# Patient Record
Sex: Male | Born: 1955
Health system: Southern US, Community
[De-identification: ages and names within clinical notes are randomized; demographics above are authoritative.]

## PROBLEM LIST (undated history)

## (undated) ENCOUNTER — Emergency Department (HOSPITAL_COMMUNITY): Discharge status: Against Medical Advice | Payer: Managed Care, Other (non HMO)

## (undated) DIAGNOSIS — F32A Depression, unspecified: Secondary | ICD-10-CM

## (undated) DIAGNOSIS — E785 Hyperlipidemia, unspecified: Secondary | ICD-10-CM

## (undated) DIAGNOSIS — F319 Bipolar disorder, unspecified: Secondary | ICD-10-CM

## (undated) DIAGNOSIS — I519 Heart disease, unspecified: Secondary | ICD-10-CM

## (undated) DIAGNOSIS — I2510 Atherosclerotic heart disease of native coronary artery without angina pectoris: Secondary | ICD-10-CM

## (undated) DIAGNOSIS — I480 Paroxysmal atrial fibrillation: Secondary | ICD-10-CM

## (undated) DIAGNOSIS — F329 Major depressive disorder, single episode, unspecified: Secondary | ICD-10-CM

## (undated) DIAGNOSIS — Z720 Tobacco use: Secondary | ICD-10-CM

## (undated) HISTORY — PX: APPENDECTOMY: SHX54

## (undated) HISTORY — PX: TONSILLECTOMY: SUR1361

---

## 2005-06-13 ENCOUNTER — Ambulatory Visit: Payer: Self-pay | Admitting: Family Medicine

## 2005-07-20 ENCOUNTER — Ambulatory Visit: Payer: Self-pay | Admitting: Family Medicine

## 2006-01-12 ENCOUNTER — Ambulatory Visit: Payer: Self-pay | Admitting: Family Medicine

## 2006-04-19 ENCOUNTER — Inpatient Hospital Stay (HOSPITAL_COMMUNITY): Admission: RE | Admit: 2006-04-19 | Discharge: 2006-04-20 | Payer: Self-pay | Admitting: Psychiatry

## 2006-04-19 ENCOUNTER — Ambulatory Visit: Payer: Self-pay | Admitting: Psychiatry

## 2010-04-13 ENCOUNTER — Encounter (INDEPENDENT_AMBULATORY_CARE_PROVIDER_SITE_OTHER): Payer: Self-pay | Admitting: *Deleted

## 2010-09-13 NOTE — Letter (Signed)
Summary: Colonoscopy Date Change Letter  [facility]  [address] [place], [place] [address]   Phone: [phone]  Fax: [phone]      April 13, 2010 MRN: [id number]   [facility] [place] [address] [place], [place]  [address]   Dear Mr. Wenrick,   Previously you were recommended to have a repeat colonoscopy around this time. Your chart was recently reviewed by Dr. Claudette Head of [facility]. Follow up colonoscopy is now recommended in October 2014. This revised recommendation is based on current, nationally recognized guidelines for colorectal cancer screening and polyp surveillance. These guidelines are endorsed by the American Cancer Society, [employer] on Colorectal Cancer as well as numerous other major medical organizations.  Please understand that our recommendation assumes that you do not have any new symptoms such as bleeding, a change in bowel habits, anemia, or significant abdominal discomfort. If you do have any concerning GI symptoms or want to discuss the guideline recommendations, please call to arrange an office visit at your earliest convenience. Otherwise we will keep you in our reminder system and contact you 1-2 months prior to the date listed above to schedule your next colonoscopy.  Thank you,  Judie Petit T. Russella Dar, M.D.  [facility] [phone]

## 2011-09-28 ENCOUNTER — Emergency Department (HOSPITAL_COMMUNITY)
Admission: EM | Admit: 2011-09-28 | Discharge: 2011-09-28 | Disposition: A | Discharge status: Home / Self Care | Payer: Managed Care, Other (non HMO) | Attending: Emergency Medicine | Admitting: Emergency Medicine

## 2011-09-28 ENCOUNTER — Telehealth: Payer: Self-pay

## 2011-09-28 ENCOUNTER — Encounter (HOSPITAL_COMMUNITY): Payer: Self-pay | Admitting: Emergency Medicine

## 2011-09-28 DIAGNOSIS — F3130 Bipolar disorder, current episode depressed, mild or moderate severity, unspecified: Secondary | ICD-10-CM | POA: Insufficient documentation

## 2011-09-28 DIAGNOSIS — R5383 Other fatigue: Secondary | ICD-10-CM | POA: Insufficient documentation

## 2011-09-28 DIAGNOSIS — F32A Depression, unspecified: Secondary | ICD-10-CM

## 2011-09-28 DIAGNOSIS — F319 Bipolar disorder, unspecified: Secondary | ICD-10-CM | POA: Insufficient documentation

## 2011-09-28 DIAGNOSIS — F329 Major depressive disorder, single episode, unspecified: Secondary | ICD-10-CM

## 2011-09-28 DIAGNOSIS — R5381 Other malaise: Secondary | ICD-10-CM | POA: Insufficient documentation

## 2011-09-28 DIAGNOSIS — F17200 Nicotine dependence, unspecified, uncomplicated: Secondary | ICD-10-CM | POA: Insufficient documentation

## 2011-09-28 HISTORY — DX: Bipolar disorder, unspecified: F31.9

## 2011-09-28 LAB — BASIC METABOLIC PANEL
BUN: 13 mg/dL (ref 6–23)
CO2: 25 mEq/L (ref 19–32)
Chloride: 102 mEq/L (ref 96–112)
GFR calc non Af Amer: 89 mL/min — ABNORMAL LOW (ref >90)
Glucose, Bld: 82 mg/dL (ref 70–99)
Potassium: 3.7 mEq/L (ref 3.5–5.1)
Sodium: 137 mEq/L (ref 135–145)

## 2011-09-28 LAB — CBC
HCT: 42.1 % (ref 39.0–52.0)
Hemoglobin: 14.1 g/dL (ref 13.0–17.0)
MCHC: 33.5 g/dL (ref 30.0–36.0)
RBC: 5.21 MIL/uL (ref 4.22–5.81)
WBC: 8.1 10*3/uL (ref 4.0–10.5)

## 2011-09-28 LAB — URINE MICROSCOPIC-ADD ON

## 2011-09-28 LAB — URINALYSIS, ROUTINE W REFLEX MICROSCOPIC
Specific Gravity, Urine: 1.01 (ref 1.005–1.030)
Urobilinogen, UA: 0.2 mg/dL (ref 0.0–1.0)
pH: 6 (ref 5.0–8.0)

## 2011-09-28 LAB — RAPID URINE DRUG SCREEN, HOSP PERFORMED
Benzodiazepines: NOT DETECTED
Cocaine: NOT DETECTED
Opiates: NOT DETECTED
Tetrahydrocannabinol: NOT DETECTED

## 2011-09-28 NOTE — Telephone Encounter (Signed)
SPOKE WITH PTS WIFE AND SHE WAS VERY UPSET STATING PT FLUSHED HIS PILLS DOWN THE TOILET. HE WAS

## 2011-09-28 NOTE — ED Notes (Signed)
Pt here to see someone for help with depression; pt sts has been seen in past for depression and was on cymbalta but could not afford it and then saw a psychiatrist and was placed on lamictal and respirdal; pt sts did not work for him and with guidance of psychiatrist titrated himself off those meds; pt sts still having depression sx with fatigue and no ambition; pt denies SI/HI; pt cooperative at present

## 2011-09-28 NOTE — BH Assessment (Signed)
Assessment Note   Jason Maldonado is an 56 y.o.Caucasian male. Pt presented to [facility] ED for reported depression and desires referrals for other doctors who treat depression. Pt reported he is depressed due to stress, marital conflict at home and came specifically today because he was sad about Valentine's Day. Pt reports isolating, loss of interest in once pleasurable activities, fatigue, lack of motivation, irritable, and hopelessness. Pt denies SI current and past, denies self harm, denies HI and denies any alcohol or drug use of hx of SA. Pt reports he has a hx of depression and was first treated by his PCP, Dr. Andee Poles, for 1.5 years with Cymbalta, which worked well for him. Pt stopped taking Cymbalta due to expense and was referred to psychiatrist, Dr. Tish Men, in December of 2012 and started taking Lamictal and Risperdol. In December before taking the new meds, pt reported he told the doctor pt was anxious about taking these medications due to potential side-effects but doctor told pt to try them. Pt did not like the side effects and was titrated off Lamictal and Risperdol two weeks ago and does not want to return to Dr. Tish Men. Pt reports his anxiety about the potential side-effects, decreased sex drive and heart palpitations, was too much for him to deal with and no longer trusts Dr. Tish Men. Pt reports an IVC 2010 to [facility], initiated by his wife and son, to which, he was released that day. Pt reports a voluntary inpatient stay in 2007 at [facility] due to depression. He took Prozac during that hospitalization and continued for two years but felt it eventually stopped working. Pt reports he came for help because he does not want to become worse, where he looses his concentration, becomes panicky and second guesses himself.   Per Felicie Morn, NP, pt was referred to seek outpatient services and was given a referral sheet for potential psychiatrists and counselors.   Axis I: 311 Depressive Dx  NOS Axis II: 799.9 Deferred Axis III:  Past Medical History  Diagnosis Date  . Bipolar 1 disorder    Axis IV: problems with access to health care services and problems with primary support group Axis V: 50  Past Medical History:  Past Medical History  Diagnosis Date  . Bipolar 1 disorder     History reviewed. No pertinent past surgical history.  Family History: History reviewed. No pertinent family history.  Social History:  reports that he has been smoking.  He does not have any smokeless tobacco history on file. He reports that he does not drink alcohol or use illicit drugs.  Additional Social History:  Alcohol / Drug Use History of alcohol / drug use?: No history of alcohol / drug abuse Allergies: No Known Allergies  Home Medications:  No current outpatient prescriptions on file as of 09/28/2011.   No current facility-administered medications on file as of 09/28/2011.    OB/GYN Status:  No LMP for male patient.  General Assessment Data Location of Assessment: [facility] ED Living Arrangements: Spouse/significant other;Children (Pt reports living with wife and 34yo son.) Can pt return to current living arrangement?: Yes Admission Status: Voluntary Is patient capable of signing voluntary admission?: Yes Transfer from: Home Referral Source: Self/Family/Friend  Education Status Is patient currently in school?: No  Risk to self Suicidal Ideation: No (Pt denies current and past SI) Suicidal Intent: No Is patient at risk for suicide?: No Suicidal Plan?: No Access to Means: No What has been your use of drugs/alcohol within the last  12 months?: Pt denies use of alcohol and drugs Previous Attempts/Gestures: No How many times?: 0  Other Self Harm Risks: 0 Triggers for Past Attempts: None known Intentional Self Injurious Behavior: None Family Suicide History: No Recent stressful life event(s): Conflict (Comment) (Pt reports conflict with his wife due to his  depression.) Persecutory voices/beliefs?: No Depression: Yes Depression Symptoms: Fatigue;Isolating;Loss of interest in usual pleasures;Feeling angry/irritable Substance abuse history and/or treatment for substance abuse?: No Suicide prevention information given to non-admitted patients: Not applicable  Risk to Others Homicidal Ideation: No Thoughts of Harm to Others: No Current Homicidal Intent: No Current Homicidal Plan: No Access to Homicidal Means: No Identified Victim: NA History of harm to others?: No Assessment of Violence: On admission Violent Behavior Description: NA Does patient have access to weapons?: No Criminal Charges Pending?: No Does patient have a court date: No  Psychosis Hallucinations: None noted Delusions: None noted  Mental Status Report Appear/Hygiene:  (Pt appeared well dressed and clean) Eye Contact: Good Motor Activity: Unremarkable Speech: Logical/coherent Level of Consciousness: Alert Mood: Depressed;Anxious Affect: Anxious;Depressed Anxiety Level: Moderate Thought Processes: Relevant;Coherent Judgement: Unimpaired Orientation: Person;Place;Time;Situation Obsessive Compulsive Thoughts/Behaviors: None  Cognitive Functioning Concentration: Normal Memory: Recent Intact;Remote Intact IQ: Average Insight: Good Impulse Control: Good Appetite: Good Weight Loss: 0  Weight Gain: 0  Sleep: No Change Total Hours of Sleep: 7  Vegetative Symptoms: None  Prior Inpatient Therapy Prior Inpatient Therapy: Yes Prior Therapy Dates: 2010, 2007 Prior Therapy Facilty/Provider(s): 2010- [facility], 2007- [facility] Reason for Treatment: Depression w/o SI  Prior Outpatient Therapy Prior Outpatient Therapy: Yes Prior Therapy Dates: Most recent visit was 2 weeks ago Prior Therapy Facilty/Provider(s): [facility], Dr. Tish Men Reason for Treatment: depression, possible bipolar I  ADL Screening (condition at time of admission) Patient's  cognitive ability adequate to safely complete daily activities?: Yes Patient able to express need for assistance with ADLs?: Yes Independently performs ADLs?: Yes Weakness of Legs: None Weakness of Arms/Hands: None  Home Assistive Devices/Equipment Home Assistive Devices/Equipment: None    Abuse/Neglect Assessment (Assessment to be complete while patient is alone) Physical Abuse: Denies Verbal Abuse: Denies Sexual Abuse: Denies Exploitation of patient/patient's resources: Denies Self-Neglect: Denies Values / Beliefs Cultural Requests During Hospitalization: None Spiritual Requests During Hospitalization: None     Nutrition Screen Unintentional weight loss greater than 10lbs within the last month: No Dysphagia: No Home Tube Feeding or Total Parenteral Nutrition (TPN): No Pregnant or Lactating: No  Additional Information 1:1 In Past 12 Months?: No CIRT Risk: No Elopement Risk: No Does patient have medical clearance?: Yes     Disposition: Felicie Morn, PA, referred pt to follow up with outpatient tx. Pt was given a list of [facility] referrals for outpatient psychiatrists and counselors. Disposition Disposition of Patient: Referred to (Pt was given a list of outpatient psychiatrists and counselo) Patient referred to: Outpatient clinic referral  On Site Evaluation by:   Reviewed with Physician:     Constance Haw, Irwin Brakeman 09/28/2011 9:03 PM

## 2011-09-28 NOTE — Discharge Instructions (Signed)
Manic Depressive Illness Manic depressive illness (manic depression) is called a bipolar disorder because patients with this illness have both ends of the range of feelings. They may feel as though they are in a deep hole during the depression phase and feel unable to get out of what they believe is a hopeless situation. During the manic phase they feel as though they are full of energy and can accomplish anything with their boundless energy. Many lives are ruined by this disease; and without effective treatment, the illness is connected with an increased risk of suicide. Bipolar disorder is a serious brain disease that causes extreme shifts in mood, energy, and functioning. It affects about 2.3 million adult Americans. This is about 1.2 percent of the population. Men and women are equally likely to develop this illness. The disorder usually starts in adolescence or early adulthood, but can start in childhood. This illness may be passed from your parents but the gene causing this illness has not been found. Cycles, or episodes, of depression, mania, or "mixed" manic and depressive symptoms often recur (come back) and may become more frequent. This illness can disrupt work, school, family, and social life. It is important to give your caregiver a complete picture of what has been happening. Help is often looked for during the depression phase. If treatment for depression is started, some of the antidepressant medications can actually make things worse. Antidepressants can trigger mania with a worsening of the illness. There are a number of medications which work well with this disease and your caregiver can help you find the medication or combination of medications which will work best for you.  SYMPTOMS  MANIA Abnormally and persistently elevated (high) mood or irritability and aggressiveness, accompanied by at least three of the following symptoms:  Overly-inflated self-esteem (You think a lot of yourself  like a show-off)   Decreased need for sleep (You feel so full of energy that it seems as if you do not need sleep)   Increased talkativeness   Racing thoughts (Your ideas and thoughts may jump from one to the other in an endless stream)   Distractibility (It is difficult to keep your mind on one subject.)   Increased goal-directed activity such as shopping   Physical agitation (You may find it difficult to sit still)   Excessive involvement in risky or reckless behaviors or activities, such as spending sprees, poor business decisions, and sexual indiscretions   Poor judgment and decision making. (Your decisions are not normal or sensible)   Impulsiveness (You react quickly in an instant without thinking things through)  DEPRESSION Symptoms include:  Loss of interest or pleasure in activities that were once enjoyed   Significant change in appetite or body weight   Difficulty sleeping, or oversleeping   Physical slowing or agitation   Loss of energy   Feelings of worthlessness or inappropriate guilt   Difficulty thinking or concentrating; poor decision making abilities   Feelings of inadequacy and low self esteem (You may feel as though you are worth nothing)   Prolonged periods of sadness without apparent cause   Unexplained crying spells   Withdrawal from usual friends and family (You may spend more time alone)   Recurrent thoughts of death and suicide  MIXED STATE Symptoms of mania and depression are present at the same time. The symptom picture frequently includes:  Agitation   Trouble sleeping   Significant change in appetite   Psychosis   Suicidal thinking  BIPOLAR DISORDER WITH   RAPID RECYCLING Especially early in the course of illness, the episodes may be separated by periods of wellness during which a person suffers few to no symptoms. When four or more episodes of illness occur within a 14-month period, the person is said to have bipolar disorder with  rapid cycling. Bipolar disorder is often complicated by co-occurring alcohol or substance abuse. PSYCHOSIS Severe depression or mania may be accompanied by symptoms of psychosis. These symptoms include: hallucinations (hearing, seeing, or otherwise sensing the presence of stimuli that are not there) and delusions (false personal beliefs that are not subject to reason or contradictory evidence, and are not explained by a person's cultural concepts). Psychotic symptoms associated with bipolar typically reflect the extreme mood state at the time. TREATMENT  Many of the above problems sound awful. The good news is that if you work with your caregivers and let them know what is wrong, they can usually help you.  A variety of medications are used to treat bipolar disorder, but even with the best medication treatment, many people with the illness have some residual (left over) symptoms. Certain types of psychotherapy or psychosocial interventions, in combination with medication, often can provide additional benefits. These include cognitive-behavioral therapy, interpersonal and social rhythm therapy, family therapy, and psychoeducation. Your caregiver can explain these therapies to you.   Lithium has long been used as a first-line treatment for bipolar disorder. It has been an effective mood-stabilizing medication for many people with bipolar disorder.   Some anticonvulsant medications have been used as alternatives to lithium in many cases. Some research suggests that different combinations of lithium and anticonvulsants may be helpful.   According to studies conducted in [place] in patients with epilepsy, valproate may increase testosterone levels in teenage girls and produce polycystic ovary syndrome in women who began taking the medication before age 26. Increased testosterone can lead to polycystic ovary syndrome with irregular or absent menses (menstrual periods), obesity (being very overweight), and  abnormal growth of hair. Therefore, young male patients taking valproate should be watched carefully by a physician.   During a depressive episode, people with bipolar disorder commonly require additional treatment with antidepressant medication. Typically, lithium or anticonvulsant mood stabilizers are prescribed along with an antidepressant to protect against a switch into mania or rapid cycling. In some cases, the newer, atypical antipsychotic drugs may help relieve severe symptoms of bipolar disorder and prevent the return of mania. More research is needed to establish the safety and efficacy of atypical antipsychotics as long-term treatments for this disorder.  Document Released: 10/27/2003 Document Revised: 04/12/2011 Document Reviewed: 07/31/2005 [facility] Patient Information 2012 [place], [place].  RESOURCE GUIDE   Please use the resource information provided by the ACT counselor to explore options for outpatient care.

## 2011-09-28 NOTE — ED Notes (Signed)
Behavioral Health here assessing patient.

## 2011-09-28 NOTE — ED Provider Notes (Signed)
History     CSN: [id number]  Arrival date & time 09/28/11  1509   First MD Initiated Contact with Patient 09/28/11 1815      Chief Complaint  Patient presents with  . Depression    (Consider location/radiation/quality/duration/timing/severity/associated sxs/prior treatment) Patient is a 56 y.o. male presenting with mental health disorder.  Mental Health Problem The primary symptoms include dysphoric mood. The current episode started more than 1 month ago. This is a chronic problem.  The onset of the illness is precipitated by emotional stress. The degree of incapacity that he is experiencing as a consequence of his illness is mild. Additional symptoms of the illness include fatigue and agitation. He does not admit to suicidal ideas. He does not have a plan to commit suicide. He does not contemplate harming himself. He has not already injured self. He does not contemplate injuring another person. He has not already  injured another person. Risk factors that are present for mental illness include a history of mental illness and a family history of mental illness.  Patient has been seen by a psychiatrist, but patient is not happy with the treatment that has been offered.  Has titrated off medications.  Recent increase in feelings of depression, states he feels disconnected and empty.  Past Medical History  Diagnosis Date  . Bipolar 1 disorder     History reviewed. No pertinent past surgical history.  History reviewed. No pertinent family history.  History  Substance Use Topics  . Smoking status: Current Everyday Smoker  . Smokeless tobacco: Not on file  . Alcohol Use: No      Review of Systems  Constitutional: Positive for fatigue.  Psychiatric/Behavioral: Positive for dysphoric mood and agitation.  All other systems reviewed and are negative.    Allergies  Review of patient's allergies indicates no known allergies.  Home Medications   Current Outpatient Rx  Name  Route Sig Dispense Refill  . IBUPROFEN 200 MG PO TABS Oral Take 600 mg by mouth every 6 (six) hours as needed. For pain    . LAMOTRIGINE 100 MG PO TABS Oral Take 100 mg by mouth daily.    Marland Kitchen LORATADINE 10 MG PO TABS Oral Take 10 mg by mouth daily as needed. For seasonal allergies    . RISPERIDONE 0.5 MG PO TABS Oral Take 0.5 mg by mouth daily.    Marland Kitchen SALINE NASAL SPRAY 0.65 % NA SOLN Nasal Place 1 spray into the nose as needed.      BP 155/115  Pulse 79  Temp(Src) 98 F (36.7 C) (Oral)  Resp 18  SpO2 99%  Physical Exam  Constitutional: He is oriented to person, place, and time. He appears well-developed and well-nourished.  HENT:  Head: Normocephalic and atraumatic.  Eyes: Conjunctivae are normal. Pupils are equal, round, and reactive to light.  Neck: Normal range of motion. Neck supple.  Cardiovascular: Normal rate, regular rhythm, normal heart sounds and intact distal pulses.   Pulmonary/Chest: Effort normal and breath sounds normal.  Abdominal: Soft. Bowel sounds are normal.  Musculoskeletal: Normal range of motion.  Neurological: He is alert and oriented to person, place, and time.  Skin: Skin is warm and dry.  Psychiatric: His speech is normal. Judgment and thought content normal. He is agitated. Cognition and memory are normal. He exhibits a depressed mood.    ED Course  Procedures (including critical care time)  Labs Reviewed - No data to display No results found.   No diagnosis found.  Marland Kitchen  1920:  Discussed with the ACT team--they will see patient after labs result. 8:45 PM  Patient has been seen and assessed by ACT.  Referral information provided to patient by counselor. MDM          Jimmye Norman, NP 09/28/11 2046

## 2011-09-28 NOTE — ED Notes (Signed)
Security called to wand pt  
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
## 2011-09-29 NOTE — ED Provider Notes (Signed)
Medical screening examination/treatment/procedure(s) were performed by non-physician practitioner and as supervising physician I was immediately available for consultation/collaboration.  Toy Baker, MD 09/29/11 [phone]

## 2012-09-27 ENCOUNTER — Encounter (HOSPITAL_COMMUNITY): Payer: Self-pay | Admitting: Nurse Practitioner

## 2012-09-27 ENCOUNTER — Encounter (HOSPITAL_COMMUNITY)
Admission: EM | Disposition: A | Discharge status: Home / Self Care | Payer: Self-pay | Source: Other Acute Inpatient Hospital | Attending: Cardiology

## 2012-09-27 ENCOUNTER — Inpatient Hospital Stay (HOSPITAL_COMMUNITY)
Admission: EM | Admit: 2012-09-27 | Discharge: 2012-10-02 | DRG: 246 | Disposition: A | Discharge status: Home / Self Care | Payer: Managed Care, Other (non HMO) | Source: Other Acute Inpatient Hospital | Attending: Cardiology | Admitting: Cardiology

## 2012-09-27 DIAGNOSIS — I2510 Atherosclerotic heart disease of native coronary artery without angina pectoris: Secondary | ICD-10-CM

## 2012-09-27 DIAGNOSIS — F17200 Nicotine dependence, unspecified, uncomplicated: Secondary | ICD-10-CM | POA: Diagnosis present

## 2012-09-27 DIAGNOSIS — I2119 ST elevation (STEMI) myocardial infarction involving other coronary artery of inferior wall: Principal | ICD-10-CM

## 2012-09-27 DIAGNOSIS — E785 Hyperlipidemia, unspecified: Secondary | ICD-10-CM

## 2012-09-27 DIAGNOSIS — Z720 Tobacco use: Secondary | ICD-10-CM | POA: Diagnosis present

## 2012-09-27 DIAGNOSIS — Z8249 Family history of ischemic heart disease and other diseases of the circulatory system: Secondary | ICD-10-CM

## 2012-09-27 DIAGNOSIS — I2109 ST elevation (STEMI) myocardial infarction involving other coronary artery of anterior wall: Secondary | ICD-10-CM

## 2012-09-27 DIAGNOSIS — I5021 Acute systolic (congestive) heart failure: Secondary | ICD-10-CM | POA: Diagnosis not present

## 2012-09-27 DIAGNOSIS — F319 Bipolar disorder, unspecified: Secondary | ICD-10-CM

## 2012-09-27 DIAGNOSIS — R570 Cardiogenic shock: Secondary | ICD-10-CM | POA: Diagnosis not present

## 2012-09-27 DIAGNOSIS — I219 Acute myocardial infarction, unspecified: Secondary | ICD-10-CM

## 2012-09-27 DIAGNOSIS — Z79899 Other long term (current) drug therapy: Secondary | ICD-10-CM

## 2012-09-27 HISTORY — DX: Tobacco use: Z72.0

## 2012-09-27 HISTORY — DX: Heart disease, unspecified: I51.9

## 2012-09-27 HISTORY — DX: Hyperlipidemia, unspecified: E78.5

## 2012-09-27 HISTORY — DX: Atherosclerotic heart disease of native coronary artery without angina pectoris: I25.10

## 2012-09-27 HISTORY — PX: LEFT HEART CATHETERIZATION WITH CORONARY ANGIOGRAM: SHX5451

## 2012-09-27 LAB — TROPONIN I: Troponin I: 2.87 ng/mL (ref <0.30)

## 2012-09-27 LAB — COMPREHENSIVE METABOLIC PANEL
ALT: 10 U/L (ref 0–53)
ALT: 30 U/L (ref 0–53)
AST: 227 U/L — ABNORMAL HIGH (ref 0–37)
Alkaline Phosphatase: 56 U/L (ref 39–117)
Alkaline Phosphatase: 60 U/L (ref 39–117)
CO2: 20 mEq/L (ref 19–32)
CO2: 22 mEq/L (ref 19–32)
Calcium: 8.6 mg/dL (ref 8.4–10.5)
Chloride: 103 mEq/L (ref 96–112)
GFR calc Af Amer: >90 mL/min (ref >90)
GFR calc Af Amer: >90 mL/min (ref >90)
GFR calc non Af Amer: >90 mL/min (ref >90)
GFR calc non Af Amer: >90 mL/min (ref >90)
Glucose, Bld: 122 mg/dL — ABNORMAL HIGH (ref 70–99)
Glucose, Bld: 89 mg/dL (ref 70–99)
Potassium: 3.5 mEq/L (ref 3.5–5.1)
Potassium: 4 mEq/L (ref 3.5–5.1)
Sodium: 133 mEq/L — ABNORMAL LOW (ref 135–145)
Sodium: 135 mEq/L (ref 135–145)

## 2012-09-27 LAB — CBC WITH DIFFERENTIAL/PLATELET
Hemoglobin: 12.6 g/dL — ABNORMAL LOW (ref 13.0–17.0)
Lymphocytes Relative: 14 % (ref 12–46)
Lymphs Abs: 1.3 10*3/uL (ref 0.7–4.0)
MCH: 27 pg (ref 26.0–34.0)
Monocytes Relative: 6 % (ref 3–12)
Neutro Abs: 7.6 10*3/uL (ref 1.7–7.7)
Neutrophils Relative %: 79 % — ABNORMAL HIGH (ref 43–77)
Platelets: 141 10*3/uL — ABNORMAL LOW (ref 150–400)
RBC: 4.67 MIL/uL (ref 4.22–5.81)
WBC: 9.6 10*3/uL (ref 4.0–10.5)

## 2012-09-27 LAB — POCT I-STAT, CHEM 8
BUN: 13 mg/dL (ref 6–23)
Calcium, Ion: 1.15 mmol/L (ref 1.12–1.23)
Chloride: 102 mEq/L (ref 96–112)
Creatinine, Ser: 1 mg/dL (ref 0.50–1.35)
Glucose, Bld: 120 mg/dL — ABNORMAL HIGH (ref 70–99)

## 2012-09-27 LAB — APTT
aPTT: 165 seconds — ABNORMAL HIGH (ref 24–37)
aPTT: 53 seconds — ABNORMAL HIGH (ref 24–37)

## 2012-09-27 LAB — TSH: TSH: 0.698 u[IU]/mL (ref 0.350–4.500)

## 2012-09-27 LAB — PROTIME-INR
INR: 9.81 (ref 0.00–1.49)
INR: 1.18 (ref 0.00–1.49)
Prothrombin Time: 14.8 seconds (ref 11.6–15.2)

## 2012-09-27 LAB — CBC
Hemoglobin: 11.9 g/dL — ABNORMAL LOW (ref 13.0–17.0)
RBC: 4.41 MIL/uL (ref 4.22–5.81)

## 2012-09-27 LAB — LIPID PANEL
LDL Cholesterol: 105 mg/dL — ABNORMAL HIGH (ref 0–99)
Triglycerides: 124 mg/dL (ref <150)
VLDL: 25 mg/dL (ref 0–40)

## 2012-09-27 SURGERY — LEFT HEART CATHETERIZATION WITH CORONARY ANGIOGRAM
Anesthesia: LOCAL

## 2012-09-27 MED ORDER — LIDOCAINE HCL (PF) 1 % IJ SOLN
INTRAMUSCULAR | Status: AC
  Filled 2012-09-27: qty 30

## 2012-09-27 MED ORDER — PRASUGREL HCL 10 MG PO TABS
ORAL_TABLET | ORAL | Status: AC
  Filled 2012-09-27: qty 6

## 2012-09-27 MED ORDER — BIVALIRUDIN 250 MG IV SOLR
INTRAVENOUS | Status: AC
  Filled 2012-09-27: qty 250

## 2012-09-27 MED ORDER — SODIUM CHLORIDE 0.9 % IJ SOLN
3.0000 mL | INTRAMUSCULAR | Status: DC | PRN
  Administered 2012-09-29: 3 mL via INTRAVENOUS

## 2012-09-27 MED ORDER — PRASUGREL HCL 10 MG PO TABS
10.0000 mg | ORAL_TABLET | Freq: Every day | ORAL | Status: DC
  Administered 2012-09-28 – 2012-10-02 (×5): 10 mg via ORAL
  Filled 2012-09-27 (×5): qty 1

## 2012-09-27 MED ORDER — ATORVASTATIN CALCIUM 80 MG PO TABS
80.0000 mg | ORAL_TABLET | Freq: Every day | ORAL | Status: DC
  Administered 2012-09-27 – 2012-10-01 (×5): 80 mg via ORAL
  Filled 2012-09-27 (×6): qty 1

## 2012-09-27 MED ORDER — ACETAMINOPHEN 325 MG PO TABS
650.0000 mg | ORAL_TABLET | ORAL | Status: DC | PRN
  Administered 2012-09-27 – 2012-09-28 (×2): 650 mg via ORAL
  Filled 2012-09-27 (×2): qty 2

## 2012-09-27 MED ORDER — HEPARIN SODIUM (PORCINE) 5000 UNIT/ML IJ SOLN
5000.0000 [IU] | Freq: Three times a day (TID) | INTRAMUSCULAR | Status: DC
  Filled 2012-09-27 (×17): qty 1

## 2012-09-27 MED ORDER — METOPROLOL TARTRATE 12.5 MG HALF TABLET
12.5000 mg | ORAL_TABLET | Freq: Two times a day (BID) | ORAL | Status: DC
  Administered 2012-09-27: 12.5 mg via ORAL
  Filled 2012-09-27 (×4): qty 1

## 2012-09-27 MED ORDER — ATROPINE SULFATE 1 MG/ML IJ SOLN
INTRAMUSCULAR | Status: AC
  Filled 2012-09-27: qty 1

## 2012-09-27 MED ORDER — RISPERIDONE 0.5 MG PO TABS
0.5000 mg | ORAL_TABLET | Freq: Every day | ORAL | Status: DC
  Administered 2012-09-27 – 2012-10-01 (×5): 0.5 mg via ORAL
  Filled 2012-09-27 (×6): qty 1

## 2012-09-27 MED ORDER — HEPARIN (PORCINE) IN NACL 2-0.9 UNIT/ML-% IJ SOLN
INTRAMUSCULAR | Status: AC
  Filled 2012-09-27: qty 1000

## 2012-09-27 MED ORDER — NITROGLYCERIN 0.4 MG SL SUBL: 0.4000 mg | SUBLINGUAL_TABLET | SUBLINGUAL | Status: DC | PRN

## 2012-09-27 MED ORDER — ONDANSETRON HCL 4 MG/2ML IJ SOLN: 4.0000 mg | Freq: Four times a day (QID) | INTRAMUSCULAR | Status: DC | PRN

## 2012-09-27 MED ORDER — LAMOTRIGINE 100 MG PO TABS
100.0000 mg | ORAL_TABLET | Freq: Every day | ORAL | Status: DC
  Administered 2012-09-27 – 2012-10-01 (×5): 100 mg via ORAL
  Filled 2012-09-27 (×6): qty 1

## 2012-09-27 MED ORDER — MIDAZOLAM HCL 2 MG/2ML IJ SOLN
INTRAMUSCULAR | Status: AC
  Filled 2012-09-27: qty 2

## 2012-09-27 MED ORDER — SODIUM CHLORIDE 0.9 % IJ SOLN
3.0000 mL | Freq: Two times a day (BID) | INTRAMUSCULAR | Status: DC
  Administered 2012-09-27 – 2012-10-01 (×4): 3 mL via INTRAVENOUS

## 2012-09-27 MED ORDER — SODIUM CHLORIDE 0.9 % IV SOLN
250.0000 mL | INTRAVENOUS | Status: DC | PRN
  Administered 2012-09-29: 1000 mL via INTRAVENOUS

## 2012-09-27 MED ORDER — ASPIRIN EC 81 MG PO TBEC
81.0000 mg | DELAYED_RELEASE_TABLET | Freq: Every day | ORAL | Status: DC
  Administered 2012-09-28 – 2012-10-02 (×5): 81 mg via ORAL
  Filled 2012-09-27 (×5): qty 1

## 2012-09-27 MED ORDER — FENTANYL CITRATE 0.05 MG/ML IJ SOLN
INTRAMUSCULAR | Status: AC
  Filled 2012-09-27: qty 2

## 2012-09-27 MED ORDER — SODIUM CHLORIDE 0.9 % IV SOLN: INTRAVENOUS | Status: AC

## 2012-09-27 MED ORDER — HEPARIN (PORCINE) IN NACL 2-0.9 UNIT/ML-% IJ SOLN
INTRAMUSCULAR | Status: AC
  Filled 2012-09-27: qty 500

## 2012-09-27 NOTE — Progress Notes (Signed)
Utilization Review Completed.   Janthony Holleman, RN, BSN Nurse Case Manager  [phone]  

## 2012-09-27 NOTE — Progress Notes (Signed)
Chaplain Note:  Chaplain responded to Code [facility]I page and provided spiritual comfort and support, for pt, Cath Lab team, and pt's boss who was in the waiting area.  Pt had no family present.  Pt's boss and Cath Lab team expressed appreciation for chaplain support.  Chaplain will follow up as needed.  09/27/12 1300  Clinical Encounter Type  Visited With Patient;Other (Comment) (Pt's employer was supporting pt )  Visit Type Spiritual support  Referral From Other (Comment) (Code STEMI page)  Spiritual Encounters  Spiritual Needs Emotional  Stress Factors  Patient Stress Factors Major life changes;Health changes  Family Stress Factors Lack of knowledge (No family present, only pt's employer)  Verdie Shire, [address]

## 2012-09-27 NOTE — Plan of Care (Signed)
Problem: Phase I Progression Outcomes Goal: Aspirin unless contraindicated Outcome: Completed/Met Date Met:  09/27/12 By EMS

## 2012-09-27 NOTE — Progress Notes (Signed)
Right femoral arterial sheath in, site level 0. Right PT pulse palpable. Procedure explained to patient regarding sheath pull. Rt fa sheath pulled w/manual pressure times 20 minutes. Post sheath pull site w/o hematoma, bruising nor oozing. Pressure dressing applied to rt groin. Rt PT pulse 3+ post sheath pull. Bedrest starts at 1515. Instructions reviewed w/patient. Patient's nurse,  Crystal, in to assess groin post sheath pull. VSS during and after sheath pull. No complications.

## 2012-09-27 NOTE — CV Procedure (Signed)
Cardiac Catheterization Procedure Note  Name: Jason Maldonado MRN: [id number] DOB: [birth year]  Procedure: Left Heart Cath, Selective Coronary Angiography, LV angiography,  PTCA/Stent of the RCA  Indication: 57 year old white male with history of hypercholesterolemia and tobacco abuse presents with an acute ST elevation myocardial infarction with 4-5 mm of ST elevation in leads 2, 3, and aVF with reciprocal ST segment depression in the anterior leads. He has ongoing chest pain in the proximal to the cardiac catheterization lab emergently.   Diagnostic Procedure Details: We initially prepped the right radial area. We were able to gain arterial access and a 6 French arterial sheath was placed. Were unable to pass a wire through the elbow area. Angiogram showed a severe stenosis at the bifurcation of the brachial artery into the radial artery Given the patient's instability we immediately proceeded with right groin access. The right groin was prepped, draped, and anesthetized with 1% lidocaine. Using the modified Seldinger technique, a 6 French sheath was introduced into the right femoral artery. Standard Judkins catheters were used for selective coronary angiography and left ventriculography. Catheter exchanges were performed over a wire.  The diagnostic procedure was well-tolerated without immediate complications.  PROCEDURAL FINDINGS Hemodynamics: AO 99/64 with a mean of 79 mmHg LV 94/14 mmHg  Coronary angiography: Coronary dominance: right  Left mainstem: Normal.  Left anterior descending (LAD): There is mild calcification in the proximal LAD. There are minor irregularities less than 10-20%. The LAD gives rise to 2 diagonal branches which appear normal.  Left circumflex (LCx): The left circumflex is relatively small and gives rise to a single marginal branch. It is normal.  Right coronary artery (RCA): The right coronary is a very large dominant vessel. It is moderately calcified throughout  the mid vessel. It has severe tortuosity in the mid vessel. The vessel is occluded in the mid vessel with TIMI grade 0 flow.  Left ventriculography: Left ventricular systolic function is abnormal, LVEF is estimated at 45 %, there is severe basal to midinferior wall hypokinesis, there is no significant mitral regurgitation   PCI Procedure Note:  Following the diagnostic procedure, the decision was made to proceed with PCI. Effient 60 mg was given orally. Weight-based bivalirudin was given for anticoagulation. Once a therapeutic ACT was achieved, a 6 [place] FR4 guide catheter was inserted.  A pro-water coronary guidewire was used to cross the lesion.  The lesion was predilated with a 2.0 mm balloon. We then predilated the lesion with a 3.0 mm balloon. There was a long segment of disease with marked tortuosity. We attempted to pass a stent but were unable to pass it through the lesion because of severe tortuosity despite excellent guide backup. We used a guide liner catheter and were able to advance this to the proximal lesion. Even with this excellent position we were unable to cross lesion with a stent. We then dilated the lesion further with a 3.5 mm noncompliant balloon. At this point we're able to pass the guide liner through the lesion itself. We were able to then pass the stent across the lesion. The lesion was then stented with a 3.5 x 32 mm Promus stent.  The stent was postdilated with a 4.0 mm noncompliant balloon.  Following PCI, there was 0% residual stenosis and TIMI-3 flow. Final angiography confirmed an excellent result. Femoral hemostasis was achieved with manual compression.  The patient tolerated the PCI procedure well. There were no immediate procedural complications.  The patient was transferred to the post catheterization recovery area  for further monitoring.  PCI Data: Vessel - RCA/Segment - mid Percent Stenosis (pre)  100% TIMI-flow 0 Stent 3.5 x 32 mm Promus Percent Stenosis (post)  0% TIMI-flow (post) 3  Final Conclusions:   1. Single vessel occlusive coronary disease. 2. Mild to moderate left ventricular dysfunction 3. Successful intracoronary stenting of the mid RCA with a drug-eluting stent.  Recommendations: Dual antiplatelet therapy for one year.  Theron Arista [facility] 09/27/2012, 12:57 PM

## 2012-09-27 NOTE — H&P (Signed)
Patient ID: Jason Maldonado MRN: [id number], DOB/AGE: [birth year]   Admit date: 09/27/2012   Primary Physician: Jason Maldonado Primary Cardiologist: new to [place] - P. [place], MD  Pt. Profile:  57 y/o male w/o prior h/o CAD who presents with acute inferolateral STEMI.  Problem List  Past Medical History  Diagnosis Date  . Bipolar 1 disorder   . Tobacco abuse   . Irregular heart beat     No past surgical history on file.   Allergies  No Known Allergies  HPI  57 y/o male with the above PMH.  He reports a h/o irreg heart beat, which was apparently evaluated by cardiology at some point in the past without any firm diagnosis.  He has a 17 pack/year h/o tob abuse and continues to smoke 1ppd.  He was in his USOH until this AM, after taking a break @ work, he had sudden onset of severe substernal chest heaviness and burning associated with mild dyspnea.  EMS was called and upon their arrival, he was found to have marked ST elevation in inferolateral leads with anterior ST depression.  Code STEMI was called and he was taken emergently to the [facility] cath lab.  He continues to c/o chest pressure.  Cath is ongoing.  Home Medications  Prior to Admission medications   Medication Sig Start Date End Date Taking? Authorizing Provider  ibuprofen (ADVIL,MOTRIN) 200 MG tablet Take 600 mg by mouth every 6 (six) hours as needed. For pain    Historical Provider, MD  lamoTRIgine (LAMICTAL) 100 MG tablet Take 100 mg by mouth daily.    Historical Provider, MD  loratadine (CLARITIN) 10 MG tablet Take 10 mg by mouth daily as needed. For seasonal allergies    Historical Provider, MD  risperiDONE (RISPERDAL) 0.5 MG tablet Take 0.5 mg by mouth daily.    Historical Provider, MD  sodium chloride (OCEAN) 0.65 % nasal spray Place 1 spray into the nose as needed.    Historical Provider, MD   Family History  Family History  Problem Relation Age of Onset  . CAD Father     deceased.   Social History  History    Social History  . Marital Status: Married    Spouse Name: N/A    Number of Children: N/A  . Years of Education: N/A   Occupational History  . Not on file.   Social History Main Topics  . Smoking status: Current Every Day Smoker -- 1.00 packs/day for 40 years  . Smokeless tobacco: Not on file  . Alcohol Use: No  . Drug Use: No  . Sexually Active: Not on file   Other Topics Concern  . Not on file   Social History Narrative   Lives in [place] by himself.  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  He does not routinely exercise.    Review of Systems General:  No chills, fever, night sweats or weight changes.  Cardiovascular:  +++ chest pain.  No dyspnea on exertion, edema, orthopnea, palpitations, paroxysmal nocturnal dyspnea. Dermatological: No rash, lesions/masses Respiratory: No cough, dyspnea Urologic: No hematuria, dysuria Abdominal:   No nausea, vomiting, diarrhea, bright red blood per rectum, melena, or hematemesis Neurologic:  No visual changes, wkns, changes in mental status. Psych:  +++ depression. All other systems reviewed and are otherwise negative except as noted above.  Physical Exam  There were no vitals taken for this visit.  General: Pleasant, tearful, NAD Psych: Normal affect. Neuro: Alert and oriented X 3. Moves all  extremities spontaneously. HEENT: Normal  Neck: Supple without bruits or JVD. Lungs:  Resp regular and unlabored, CTA. Heart: RRR no s3, s4, or murmurs. Abdomen: Soft, non-tender, non-distended, BS + x 4.  Extremities: No clubbing, cyanosis or edema. DP/PT/Radials 2+ and equal bilaterally.  Labs  Pending.   Radiology/Studies  No results found.  ECG  Rsr, 79, 1st deg avb, pac's, 3-4 mm ST elevation II, III, aVF, V5-V6 with deep ST depression in I, aVL, V1-V3.  ASSESSMENT AND PLAN  1.  Acute inferolateral STEMI:  Cath ongoing.  Plan to admit to CCU.  Add asa, p2y12 inhibitor (if appropriate), bb, high potency statin.  Eventual cardiac  rehab.  2.  Tob Abuse:  Cessation counseling.  3.  Lipids:  Add statin.  Check lipids/lft's.  4.  Bipolar D/O:  Cont home meds.   Signed, Nicolasa Ducking, NP 09/27/2012, 11:33 AM Patient seen and examined and history reviewed. Agree with above findings and plan. 57 yo WM with history of tobacco abuse, hyperlipidemia and family history of CAD presents with acute inferior STEMI with 4-5 mm ST elevation in the inferior leads and reciprocal ST depression in the anterior leads. He has ongoing chest pain and will undergo emergent cardiac cath with PCI.  Theron Arista [facility] 09/27/2012 1:11 PM

## 2012-09-28 DIAGNOSIS — I219 Acute myocardial infarction, unspecified: Secondary | ICD-10-CM

## 2012-09-28 DIAGNOSIS — I2119 ST elevation (STEMI) myocardial infarction involving other coronary artery of inferior wall: Secondary | ICD-10-CM

## 2012-09-28 LAB — CBC
HCT: 33.6 % — ABNORMAL LOW (ref 39.0–52.0)
MCH: 27.3 pg (ref 26.0–34.0)
MCHC: 33.9 g/dL (ref 30.0–36.0)
MCV: 80.1 fL (ref 78.0–100.0)
MCV: 80.4 fL (ref 78.0–100.0)
Platelets: 149 10*3/uL — ABNORMAL LOW (ref 150–400)
RBC: 4.18 MIL/uL — ABNORMAL LOW (ref 4.22–5.81)
RDW: 13.8 % (ref 11.5–15.5)
WBC: 9.2 10*3/uL (ref 4.0–10.5)

## 2012-09-28 LAB — BASIC METABOLIC PANEL
BUN: 13 mg/dL (ref 6–23)
Chloride: 105 mEq/L (ref 96–112)
Creatinine, Ser: 1.06 mg/dL (ref 0.50–1.35)
GFR calc Af Amer: 88 mL/min — ABNORMAL LOW (ref >90)

## 2012-09-28 LAB — TROPONIN I: Troponin I: >20 ng/mL (ref <0.30)

## 2012-09-28 LAB — HEMOGLOBIN A1C: Mean Plasma Glucose: 117 mg/dL — ABNORMAL HIGH (ref <117)

## 2012-09-28 LAB — LIPID PANEL: Total CHOL/HDL Ratio: 4.1 RATIO

## 2012-09-28 MED ORDER — SODIUM CHLORIDE 0.9 % IV BOLUS (SEPSIS)
500.0000 mL | Freq: Once | INTRAVENOUS | Status: AC
  Administered 2012-09-28: 06:00:00 via INTRAVENOUS

## 2012-09-28 MED ORDER — LORAZEPAM 0.5 MG PO TABS: 0.5000 mg | ORAL_TABLET | Freq: Four times a day (QID) | ORAL | Status: DC | PRN

## 2012-09-28 MED ORDER — SODIUM CHLORIDE 0.9 % IV SOLN
INTRAVENOUS | Status: AC
  Administered 2012-09-28: 01:00:00 via INTRAVENOUS

## 2012-09-28 MED ORDER — DOPAMINE-DEXTROSE 3.2-5 MG/ML-% IV SOLN
2.5000 ug/kg/min | INTRAVENOUS | Status: DC
  Administered 2012-09-28: 2.5 ug/kg/min via INTRAVENOUS
  Filled 2012-09-28: qty 250

## 2012-09-28 NOTE — Progress Notes (Signed)
PROGRESS NOTE  Subjective:   Pt is a 57 yo admitted with Inferior wall STEMI.  S/p DES (3.5 x 32 mm Promus stent. The stent was postdilated with a 4.0 mm noncompliant balloon).  He has had some hypotension since his PCI.  He is somewhat belligerent and argumentative in his conversation this am.  He did not like the fact that I suggested that he would be here several more days.  His current BP is 69/44 and we are starting Dopamine.     Objective:    Vital Signs:   Temp:  [98.6 F (37 C)-99 F (37.2 C)] 98.9 F (37.2 C) (02/15 0400) Pulse Rate:  [61-84] 68 (02/15 0400) Resp:  [12-24] 21 (02/15 0549) BP: (73-122)/(41-84) 84/55 mmHg (02/15 0600) SpO2:  [95 %-100 %] 96 % (02/15 0400) Arterial Line BP: (129-132)/(74-79) 129/75 mmHg (02/14 1500) Weight:  [146 lb 6.2 oz (66.4 kg)-151 lb 10.8 oz (68.8 kg)] 146 lb 6.2 oz (66.4 kg) (02/15 0500)  Last BM Date: 09/26/12   24-hour weight change: Weight change:   Weight trends: Filed Weights   09/27/12 1400 09/28/12 0500  Weight: 151 lb 10.8 oz (68.8 kg) 146 lb 6.2 oz (66.4 kg)    Intake/Output:  02/14 0701 - 02/15 0700 In: 2097.4 [P.O.:810; I.V.:1070.8; IV Piggyback:216.7] Out: 1950 [Urine:1950]     Physical Exam: BP 84/55  Pulse 68  Temp(Src) 98.9 F (37.2 C) (Oral)  Resp 21  Ht 5\' 7"  (1.702 m)  Wt 146 lb 6.2 oz (66.4 kg)  BMI 22.92 kg/m2  SpO2 96%  General: Vital signs reviewed and noted.  Denies any pain , anywhere ("I'm fine, why is everybody asking me these questions?")  Head: Normocephalic, atraumatic.  Eyes: conjunctivae/corneas clear.  EOM's intact.   Throat: normal  Neck:  normal, no JVD  Lungs:    clear  Heart:  RR  Abdomen:  Soft, non-tender, non-distended , no rebound  Extremities: Right radial cath site and right femoral cath site are normal.  No evidence of hematoma  Neurologic: A&O X3, CN II - XII are grossly intact.   Psych: Seems to be very belligerent this am.  Challenged / disagreed with  all of  my statements    Labs: BMET:  Recent Labs  09/27/12 1209 09/27/12 1212 09/27/12 1526  NA 133* 133* 135  K 3.5 3.6 4.0  CL 101 102 103  CO2 20  --  22  GLUCOSE 122* 120* 89  BUN 13 13 11   CREATININE 0.94 1.00 0.95  CALCIUM 8.1*  --  8.6  MG  --   --  1.8    Liver function tests:  Recent Labs  09/27/12 1209 09/27/12 1526  AST 28 227*  ALT 10 30  ALKPHOS 56 60  BILITOT 0.2* 0.3  PROT 5.7* 6.1  ALBUMIN 3.5 3.6   No results found for this basename: LIPASE, AMYLASE,  in the last 72 hours  CBC:  Recent Labs  09/27/12 1212 09/27/12 1526 09/28/12 0115  WBC  --  9.6 9.2  NEUTROABS  --  7.6  --   HGB 12.2* 12.6* 11.4*  HCT 36.0* 37.1* 33.5*  MCV  --  79.4 80.1  PLT  --  141* 149*    Cardiac Enzymes:  Recent Labs  09/27/12 1209 09/27/12 1526 09/27/12 2221  CKTOTAL 360*  --   --   CKMB 23.2*  --   --   TROPONINI 2.87* >20.00* >20.00*    Coagulation Studies:  Recent  Labs  09/27/12 1209 09/27/12 1526  LABPROT 71.1* 14.8  INR 9.81* 1.18    Other: No components found with this basename: POCBNP,  No results found for this basename: DDIMER,  in the last 72 hours  Recent Labs  09/27/12 1526  HGBA1C 5.7*    Recent Labs  09/27/12 1209  CHOL 170  HDL 40  LDLCALC 105*  TRIG 124  CHOLHDL 4.3    Recent Labs  09/27/12 1526  TSH 0.698   No results found for this basename: VITAMINB12, FOLATE, FERRITIN, TIBC, IRON, RETICCTPCT,  in the last 72 hours   Other results:  EKG : pending , nurse is calling for it to be done.  Medications:    Infusions: . DOPamine      Scheduled Medications: . aspirin EC  81 mg Oral Daily  . atorvastatin  80 mg Oral q1800  . heparin  5,000 Units Subcutaneous Q8H  . lamoTRIgine  100 mg Oral Daily  . metoprolol tartrate  12.5 mg Oral BID  . prasugrel  10 mg Oral Daily  . risperiDONE  0.5 mg Oral Daily  . sodium chloride  500 mL Intravenous Once  . sodium chloride  3 mL Intravenous Q12H    Assessment/  Plan:   1.  CAD:  S/p Inferior wall STEMI. I suspect he has also had an RV infarction ( hypotension, post mi.   His abdomen/ groin  are benign. No evidence for hematoma.  dopamine has been started.  Metoprolol will be held .  ACE inhibitor will be started after his hypotension resolves.  Getting IVF (NS)  2. Cardiogenic shock : due to RV infarction, starting Dopamine.  3. Hyperlipidemia:  He resisted taking his atorvastatin for a while but eventually took it.  4. VTE prophylaxis:  Patient is refusing his LDSQH.    Disposition: keep in CCU today Length of Stay: 1  Vesta Mixer, Montez Hageman., MD, [facility] 09/28/2012, 7:21 AM Office [phone] Pager [phone]

## 2012-09-28 NOTE — Progress Notes (Signed)
Patient Name: Jason Maldonado Date of Encounter: 09/28/2012     Principal Problem:   ST elevation myocardial infarction (STEMI) of inferior wall Active Problems:   Hyperlipidemia   Tobacco abuse    SUBJECTIVE  - Currently no chest pain or SOB. Had mild chest wall pain which he thinks it was car safety belt related injury and resolved after taking tylenol  - Tel: sinus rhythm with occasional APC  - Bp has been soft, SPB has been around 70 to 80 mHG. receiving NS 75 CC after cath, and now getting 500 cc bolus. Asymptomatic.  - Hgb was 11.4 at 1:15 in AM.   - Urine not measured, but patient urinated in bathroom with at least 700 ml X 2   CURRENT MEDS Scheduled Meds: . aspirin EC  81 mg Oral Daily  . atorvastatin  80 mg Oral q1800  . heparin  5,000 Units Subcutaneous Q8H  . lamoTRIgine  100 mg Oral Daily  . metoprolol tartrate  12.5 mg Oral BID  . prasugrel  10 mg Oral Daily  . risperiDONE  0.5 mg Oral Daily  . sodium chloride  500 mL Intravenous Once  . sodium chloride  3 mL Intravenous Q12H   Continuous Infusions:  PRN Meds:.sodium chloride, acetaminophen, nitroGLYCERIN, ondansetron (ZOFRAN) IV, sodium chloride  OBJECTIVE  Filed Vitals:   09/28/12 0300 09/28/12 0400 09/28/12 0500 09/28/12 0549  BP: 73/49 77/48 75/41  73/50  Pulse:  68    Temp:  98.9 F (37.2 C)    TempSrc:  Oral    Resp: 19 21 18 21   Height:      Weight:   146 lb 6.2 oz (66.4 kg)   SpO2:  96%      Intake/Output Summary (Last 24 hours) at 09/28/12 0628 Last data filed at 09/28/12 0500  Gross per 24 hour  Intake 1880.75 ml  Output   1950 ml  Net -69.25 ml   CVP:  Filed Weights   09/27/12 1400 09/28/12 0500  Weight: 151 lb 10.8 oz (68.8 kg) 146 lb 6.2 oz (66.4 kg)    PHYSICAL EXAM  General: Pleasant, NAD. Neuro: Alert and oriented X 3. Moves all extremities spontaneously. Psych: Normal affect. HEENT:  Normal  Neck: Supple without bruits or JVD. Lungs:  Resp regular and  unlabored, CTA. Heart: regular rhythm with frequent premature beats. No s3, s4, or murmurs. Abdomen: Soft, non-tender, non-distended, BS.  Extremities: No clubbing, cyanosis or edema. DP/PT/Radials 2+ and equal bilaterally.  Accessory Clinical Findings  CBC  Recent Labs  09/27/12 1212 09/27/12 1526 09/28/12 0115  WBC  --  9.6 9.2  NEUTROABS  --  7.6  --   HGB 12.2* 12.6* 11.4*  HCT 36.0* 37.1* 33.5*  MCV  --  79.4 80.1  PLT  --  141* 149*   Basic Metabolic Panel  Recent Labs  09/27/12 1209 09/27/12 1212 09/27/12 1526  NA 133* 133* 135  K 3.5 3.6 4.0  CL 101 102 103  CO2 20  --  22  GLUCOSE 122* 120* 89  BUN 13 13 11   CREATININE 0.94 1.00 0.95  CALCIUM 8.1*  --  8.6  MG  --   --  1.8     Recent Labs Lab 09/27/12 1209 09/27/12 1212 09/27/12 1526  NA 133* 133* 135  K 3.5 3.6 4.0  CL 101 102 103  CO2 20  --  22  GLUCOSE 122* 120* 89  BUN 13 13 11   CREATININE 0.94 1.00  0.95  CALCIUM 8.1*  --  8.6  MG  --   --  1.8     Liver Function Tests  Recent Labs  09/27/12 1209 09/27/12 1526  AST 28 227*  ALT 10 30  ALKPHOS 56 60  BILITOT 0.2* 0.3  PROT 5.7* 6.1  ALBUMIN 3.5 3.6   No results found for this basename: LIPASE, AMYLASE,  in the last 72 hours Cardiac Enzymes  Recent Labs  09/27/12 1209 09/27/12 1526 09/27/12 2221  CKTOTAL 360*  --   --   CKMB 23.2*  --   --   TROPONINI 2.87* >20.00* >20.00*     Recent Labs Lab 09/27/12 1209 09/27/12 1526 09/27/12 2221  TROPONINI 2.87* >20.00* >20.00*      BNP No components found with this basename: POCBNP,  D-Dimer No results found for this basename: DDIMER,  in the last 72 hours Hemoglobin A1C  Recent Labs  09/27/12 1526  HGBA1C 5.7*   Fasting Lipid Panel  Recent Labs  09/27/12 1209  CHOL 170  HDL 40  LDLCALC 105*  TRIG 124  CHOLHDL 4.3   Thyroid Function Tests  Recent Labs  09/27/12 1526  TSH 0.698    TELE: sinus rhythm with occasional APC  ECG: no new  EKG  2D-Echo: pending  Radiology/Studies  No results found.  ASSESSMENT AND PLAN  Patient is 57 y/o male with PMH of tobacco abuse, bipolar 1 disorder, HLD, who was admitted for inferolateraal STEMI. Now is s/p cardiac cath and DMS.   #: STEMI: had single vessel occlusive coronary disease (RCA/Segment - mid percent Stenosis 100%). Mild to moderate left ventricular dysfunction, Successful intracoronary stenting of the mid RCA with a drug-eluting stent. Now chest pain free. He likely has RV infarction to explain this hypotension.  -Continue ASA, Effient -continue Lipitor, Lopressor and prn NG. - Card rehab - tend CBC, if Hgb drop, will get CT-abd to rule out internal hematoma.   #: HLD: LDL 105 and HDL 40. ALT normal. AST 227.  -continue Lipitor  #:  Tobacco Abuse: 1 PAD x 40 years. -consult social work  #: DVT PPx: [name] Heparin   Signed, Lorretta Harp, MD PGY2, Internal Medicine Teaching Service Pager: [phone]  Attending Note:   The patient was seen and examined.  Agree with assessment and plan as noted above.  Changes made to note as needed.  See my note from earlier today.  Vesta Mixer, Montez Hageman., MD, [facility] 09/28/2012, 8:49 AM .

## 2012-09-28 NOTE — Progress Notes (Signed)
Cardiac Rehab Phase I  Patients Floor RN states today is just not a good day for him to ambulate. Pt has been agitated today and he has had low BPs. We will f/u with patient on Monday.

## 2012-09-28 NOTE — Progress Notes (Signed)
Patient's BP in 70's/40-50's. Patient is asymptomatic, HR in 60-70 SR, O2 sats mid 90's on room air. Physician on call notified, advised to monitor patient for any changes. Jason Maldonado [place]

## 2012-09-28 NOTE — Plan of Care (Signed)
Cardiology Cross-cover note  Jason Maldonado is s/p inferior STEMI with DES to mid RCA.  He had no reported complications.  Overnight patient had low SBPS in 70-80s.  He was asymptomatic.  He denied any chest pain, shortness of breath, nausea, abdominal or back pain.  He had no significant bleeding or groin hematoma noted at femoral access site.  Bedside Echo with limited views demonstrated no pericardial effusion, EF ~35-40%.  Assessment/Plan: Hypotension post inferior STEMI 1. CBC stable will repeat in am 2. Saline bolus, if no response consider low dose dopamine 3. Formal TTE this am 4. May need non-contrast CT scan to r/o retroperitoneal bleed

## 2012-09-28 NOTE — Progress Notes (Signed)
  Echocardiogram 2D Echocardiogram has been performed.  [place] 09/28/2012, 2:44 PM

## 2012-09-29 ENCOUNTER — Encounter (HOSPITAL_COMMUNITY): Payer: Self-pay | Admitting: Internal Medicine

## 2012-09-29 DIAGNOSIS — I2129 ST elevation (STEMI) myocardial infarction involving other sites: Secondary | ICD-10-CM

## 2012-09-29 DIAGNOSIS — F17200 Nicotine dependence, unspecified, uncomplicated: Secondary | ICD-10-CM

## 2012-09-29 MED ORDER — SODIUM CHLORIDE 0.9 % IV SOLN
INTRAVENOUS | Status: AC
  Administered 2012-09-29: 10:00:00 via INTRAVENOUS

## 2012-09-29 NOTE — Progress Notes (Signed)
Patient Name: Jason Maldonado Date of Encounter: 09/29/2012  Principal Problem:   ST elevation myocardial infarction (STEMI) of inferior wall Active Problems:   Hyperlipidemia   Tobacco abuse    SUBJECTIVE No acute events overnight, BP continues to run soft this morning and he is still on Dopamine gtt.  Denies chest pain and SOB  CURRENT MEDS . aspirin EC  81 mg Oral Daily  . atorvastatin  80 mg Oral q1800  . heparin  5,000 Units Subcutaneous Q8H  . lamoTRIgine  100 mg Oral Daily  . prasugrel  10 mg Oral Daily  . risperiDONE  0.5 mg Oral Daily  . sodium chloride  3 mL Intravenous Q12H    OBJECTIVE  Filed Vitals:   09/29/12 0200 09/29/12 0300 09/29/12 0400 09/29/12 0500  BP: 99/55 103/60 108/58 116/65  Pulse:      Temp:   98.6 F (37 C)   TempSrc:   Oral   Resp: 20 18 21 14   Height:      Weight:    150 lb 5.7 oz (68.2 kg)  SpO2:   96%     Intake/Output Summary (Last 24 hours) at 09/29/12 1610 Last data filed at 09/29/12 0500  Gross per 24 hour  Intake 2473.48 ml  Output   2900 ml  Net -426.52 ml  Net Admission -  Filed Weights   09/27/12 1400 09/28/12 0500 09/29/12 0500  Weight: 151 lb 10.8 oz (68.8 kg) 146 lb 6.2 oz (66.4 kg) 150 lb 5.7 oz (68.2 kg)    PHYSICAL EXAM General: NAD. Neuro: Alert and oriented X 3. Moves all extremities spontaneously. HEENT:  Normal  Neck: Supple without bruits or JVD. Lungs:  Resp regular and unlabored, CTA. Heart: RRR no s3, s4, or murmurs. Abdomen: Soft, non-tender, non-distended, BS + x 4.  Extremities: No clubbing, cyanosis or edema. DP/PT/Radials 2+ and equal bilaterally.  Accessory Clinical Findings  CBC  Recent Labs  09/27/12 1212 09/27/12 1526 09/28/12 0115 09/28/12 0630  WBC  --  9.6 9.2 8.8  NEUTROABS  --  7.6  --   --   HGB 12.2* 12.6* 11.4* 11.4*  HCT 36.0* 37.1* 33.5* 33.6*  MCV  --  79.4 80.1 80.4  PLT  --  141* 149* 133*   Basic Metabolic Panel  Recent Labs  09/27/12 1212  09/27/12 1526 09/28/12 0630  NA 133* 135 137  K 3.6 4.0 4.1  CL 102 103 105  CO2  --  22 21  GLUCOSE 120* 89 102*  BUN 13 11 13   CREATININE 1.00 0.95 1.06  CALCIUM  --  8.6 8.3*  MG  --  1.8  --    Liver Function Tests  Recent Labs  09/27/12 1209 09/27/12 1526  AST 28 227*  ALT 10 30  ALKPHOS 56 60  BILITOT 0.2* 0.3  PROT 5.7* 6.1  ALBUMIN 3.5 3.6   Cardiac Enzymes  Recent Labs  09/27/12 1209 09/27/12 1526 09/27/12 2221 09/28/12 0630  CKTOTAL 360*  --   --   --   CKMB 23.2*  --   --   --   TROPONINI 2.87* >20.00* >20.00* >20.00*   Hemoglobin A1C  Recent Labs  09/27/12 1526  HGBA1C 5.7*   Fasting Lipid Panel  Recent Labs  09/28/12 0630  CHOL 153  HDL 37*  LDLCALC 102*  TRIG 72  CHOLHDL 4.1   Thyroid Function Tests  Recent Labs  09/27/12 1526  TSH 0.698    TELE-  sinus rhythm  EKG (09/28/12) : no STE, +TWI in II, III, aVF   ASSESSMENT AND PLAN Patient is a 57 yo M with history of tobacco abuse presented on 09/27/12 with an acute inferolateral STEMI (trop >20)  #STEMI (inferior wall, suspect RV infarct): Day 2 s/p DES to RCA -ASA & Effient daily -Atorvastatin 80 qHS -Metoprolol held in setting of hypotension; start ACEI when hypotn resolves  #CAD: as above  #Acute Systolic CHF: EF 95% on cath, likely secondary to STEMI; no s/s of fluid overload -f/u echo -ACEI as BP tolerates  #Cardiogenic shock: d/t inferior wall MI; Dopamine gtt - continue to wean as tolerated  #HLD: LDL 102; atorvastatin  #Tobacco abuse: encourage cessation  #VTE ppx: Patient has been refusing heparin  #Code Status: full   #Dispo: pending clinical improvement, consider transfer to SDU  Signed, [name] MD, PGY2  Attending Note:   The patient was seen and examined.  Agree with assessment and plan as noted above.  Mr. Reinig feels much better this am.  He is still hypotensive because of his RV infarct. - requiring Dopamine.  Will give him IV NS 75 cc  / hr for 1 liter.  Will keep in the CCU and consider transfer tomorrow.  DC Dopamine once BP is better ( ie above 100)  Vesta Mixer, Montez Hageman., MD, [facility] 09/29/2012, 8:23 AM

## 2012-09-30 ENCOUNTER — Encounter (HOSPITAL_COMMUNITY): Payer: Self-pay | Admitting: Internal Medicine

## 2012-09-30 LAB — BASIC METABOLIC PANEL
CO2: 24 mEq/L (ref 19–32)
Calcium: 8.6 mg/dL (ref 8.4–10.5)
Creatinine, Ser: 0.94 mg/dL (ref 0.50–1.35)
GFR calc non Af Amer: >90 mL/min (ref >90)
Glucose, Bld: 99 mg/dL (ref 70–99)
Sodium: 138 mEq/L (ref 135–145)

## 2012-09-30 LAB — CBC
MCH: 27.6 pg (ref 26.0–34.0)
MCHC: 34.5 g/dL (ref 30.0–36.0)
MCV: 80 fL (ref 78.0–100.0)
Platelets: 124 10*3/uL — ABNORMAL LOW (ref 150–400)
RDW: 13.5 % (ref 11.5–15.5)

## 2012-09-30 MED ORDER — SODIUM CHLORIDE 0.9 % IV SOLN
INTRAVENOUS | Status: DC
  Administered 2012-09-30 – 2012-10-01 (×4): via INTRAVENOUS

## 2012-09-30 MED FILL — Dextrose Inj 5%: INTRAVENOUS | Qty: 50 | Status: AC

## 2012-09-30 NOTE — Progress Notes (Signed)
CARDIAC REHAB PHASE I   PRE:  Rate/Rhythm: 91 SR with many PACs    BP: sitting 95/60    SaO2:   MODE:  Ambulation: 700 ft   POST:  Rate/Rhythm: 113 standing but 85-90 walking with PACs at times    BP: sitting 107/89     SaO2:   Pt steady, no c/o walking. Increase in BP, esp diastolic. Pt has increase in PACs at time, did not seem associated with exertion. To recliner, began ed with great detail due to pt questions. Will continue to follow. [id number]  Harriet Masson CES, ACSM

## 2012-09-30 NOTE — Progress Notes (Signed)
Patient Name: Jason Maldonado Date of Encounter: 09/30/2012   Principal Problem:   ST elevation myocardial infarction (STEMI) of inferior wall Active Problems:   Hyperlipidemia   Tobacco abuse    SUBJECTIVE Remains on 2.5 mcg/kg/min of Dopamine; when down to , SBP dropped to 60; no other acute events overnight. Patient denies chest pain, SOB, dizizness.   CURRENT MEDS . aspirin EC  81 mg Oral Daily  . atorvastatin  80 mg Oral q1800  . heparin  5,000 Units Subcutaneous Q8H  . lamoTRIgine  100 mg Oral Daily  . prasugrel  10 mg Oral Daily  . risperiDONE  0.5 mg Oral Daily  . sodium chloride  3 mL Intravenous Q12H    OBJECTIVE  Filed Vitals:   09/30/12 0445 09/30/12 0500 09/30/12 0515 09/30/12 0530  BP: 98/49 110/59 101/56 101/49  Pulse:      Temp:      TempSrc:      Resp: 16 20 21 18   Height:      Weight:      SpO2:        Intake/Output Summary (Last 24 hours) at 09/30/12 0642 Last data filed at 09/30/12 0515  Gross per 24 hour  Intake 1369.96 ml  Output   3000 ml  Net -1630.04 ml  Net Admission: -2,127mL  Filed Weights   09/27/12 1400 09/28/12 0500 09/29/12 0500  Weight: 151 lb 10.8 oz (68.8 kg) 146 lb 6.2 oz (66.4 kg) 150 lb 5.7 oz (68.2 kg)    PHYSICAL EXAM  General: Pleasant, NAD. Neuro: Alert and oriented X 3. Moves all extremities spontaneously. Psych: Normal affect. HEENT:  Normal  Neck: Supple without bruits or JVD. Lungs:  Resp regular and unlabored, CTA. Heart: RRR no s3, s4, or murmurs. Abdomen: Soft, non-tender, non-distended, BS + x 4.  Extremities: No clubbing, cyanosis or edema. DP/PT/Radials 2+ and equal bilaterally.  Accessory Clinical Findings  CBC  Recent Labs  09/27/12 1212 09/27/12 1526  09/28/12 0630 09/30/12 0700  WBC  --  9.6  < > 8.8 7.3  NEUTROABS  --  7.6  --   --   --   HGB 12.2* 12.6*  < > 11.4* 12.3*  HCT 36.0* 37.1*  < > 33.6* 35.7*  MCV  --  79.4  < > 80.4 80.0  PLT  --  141*  < > 133* 124*  < > =  values in this interval not displayed. Basic Metabolic Panel  Recent Labs  09/27/12 1212 09/27/12 1526 09/28/12 0630  NA 133* 135 137  K 3.6 4.0 4.1  CL 102 103 105  CO2  --  22 21  GLUCOSE 120* 89 102*  BUN 13 11 13   CREATININE 1.00 0.95 1.06  CALCIUM  --  8.6 8.3*  MG  --  1.8  --    Liver Function Tests  Recent Labs  09/27/12 1209 09/27/12 1526  AST 28 227*  ALT 10 30  ALKPHOS 56 60  BILITOT 0.2* 0.3  PROT 5.7* 6.1  ALBUMIN 3.5 3.6   Cardiac Enzymes  Recent Labs  09/27/12 1209 09/27/12 1526 09/27/12 2221 09/28/12 0630  CKTOTAL 360*  --   --   --   CKMB 23.2*  --   --   --   TROPONINI 2.87* >20.00* >20.00* >20.00*   Hemoglobin A1C  Recent Labs  09/27/12 1526  HGBA1C 5.7*   Fasting Lipid Panel  Recent Labs  09/28/12 0630  CHOL 153  HDL 37*  LDLCALC 102*  TRIG 72  CHOLHDL 4.1   Thyroid Function Tests  Recent Labs  09/27/12 1526  TSH 0.698    TELE - sinus rhythm with some PVCs yesterday  ECG (09/1612): Q waves in II, III, aVF; sinus   ASSESSMENT AND PLAN Patient is a 57 yo M with history of tobacco abuse presented on 09/27/12 with an acute inferolateral STEMI (trop >20)   #STEMI (inferior wall, suspect RV infarct): Day 3 s/p DES to RCA  -ASA & Effient daily  -Atorvastatin 80 qHS  -No metoprolol in setting of hypotension   #CAD: as above   #Acute Systolic CHF: EF 84% on cath, likely secondary to STEMI; Echo on 09/28/12 reveals improved EF 55% with mild hypokinesis at the base of the inferior wall (very subtle)  #Cardiogenic shock: d/t inferior wall MI; Dopamine gtt - continue to wean as tolerated   #HLD: LDL 102; atorvastatin   #Tobacco abuse: encourage cessation   #Bipolar Disorder: stable with home dose of riperdal & lamotrigine   #VTE ppx: Patient has been refusing heparin, add SCDs  #Code Status: full   #Dispo: pending clinical improvement, consider transfer to SDU  Signed, Jason Gardner MD, PGY 2  Patients only  complaint is iv in left arm.  No chest pain or dyspnea.  Reviewed cath and no left sided disease.  Reviewed echo and EF 50-55% RV is ok and not particularly dilated.  No other mechanical complications and no pericardial effusion. Hydrate and try to wean pressors.  Stay in unit until off pressors  Jason Maldonado

## 2012-10-01 DIAGNOSIS — R570 Cardiogenic shock: Secondary | ICD-10-CM

## 2012-10-01 NOTE — Progress Notes (Signed)
Patient Name: Jason Maldonado Date of Encounter: 10/01/2012  Principal Problem:   ST elevation myocardial infarction (STEMI) of inferior wall Active Problems:   Cardiogenic shock   Bipolar 1 disorder   Hyperlipidemia   Tobacco abuse    SUBJECTIVE: No chest pain or SOB, denies dizziness with ambulation or position change.  OBJECTIVE Filed Vitals:   10/01/12 0007 10/01/12 0350 10/01/12 0409 10/01/12 0500  BP:  98/67    Pulse:      Temp: 98.3 F (36.8 C)  98.4 F (36.9 C)   TempSrc: Oral  Oral   Resp:  18    Height:      Weight:    148 lb 9.4 oz (67.4 kg)  SpO2: 96%  96%     Intake/Output Summary (Last 24 hours) at 10/01/12 0800 Last data filed at 10/01/12 0700  Gross per 24 hour  Intake   2958 ml  Output   2700 ml  Net    258 ml   Filed Weights   09/29/12 0500 09/30/12 0500 10/01/12 0500  Weight: 150 lb 5.7 oz (68.2 kg) 148 lb 5.9 oz (67.3 kg) 148 lb 9.4 oz (67.4 kg)   PHYSICAL EXAM General: Well developed, well nourished, male in no acute distress. Head: Normocephalic, atraumatic.  Neck: Supple without bruits, JVD not elevated. Lungs:  Resp regular and unlabored, CTA except a few rales. Heart: RRR, S1, S2, no S3, S4, or murmur. Abdomen: Soft, non-tender, non-distended, BS + x 4.  Extremities: No clubbing, cyanosis, no edema.  Neuro: Alert and oriented X 3. Moves all extremities spontaneously. Psych: Normal affect.  LABS: CBC:  Recent Labs  09/30/12 0700  WBC 7.3  HGB 12.3*  HCT 35.7*  MCV 80.0  PLT 124*   INR:No results found for this basename: INR,  in the last 72 hours Basic Metabolic Panel:  Recent Labs  32/44/01 0700  NA 138  K 3.9  CL 105  CO2 24  GLUCOSE 99  BUN 12  CREATININE 0.94  CALCIUM 8.6    TELE:  SR, PACs  Radiology/Studies: No results found.  Current Medications:  . aspirin EC  81 mg Oral Daily  . atorvastatin  80 mg Oral q1800  . heparin  5,000 Units Subcutaneous Q8H  . lamoTRIgine  100 mg Oral Daily  .  prasugrel  10 mg Oral Daily  . risperiDONE  0.5 mg Oral Daily  . sodium chloride  3 mL Intravenous Q12H   . sodium chloride 125 mL/hr at 10/01/12 0617  . DOPamine Stopped (09/30/12 2000)    ASSESSMENT AND PLAN: Principal Problem:   ST elevation myocardial infarction (STEMI) of inferior wall - S/P DES RCA 09/27/2012, continue DAPT, statin, add BB once BP will allow.  Active Problems:   Cardiogenic shock - off DA since 2/17 at 8 pm, still on IVF at 125 cc/hr, changes per MD as BP still approx 100.     Bipolar 1 disorder - on home Rx    Hyperlipidemia - Rx is new, recheck LFTs in am since AST was elevated on admit    Tobacco abuse - continued cessation advised.  Plan - consider Tx telemetry today, possible d/c in am if can wean off IVF and does well with ambulation.  Signed, Theodore Demark , PA-C 8:00 AM 10/01/2012  Patient seen and examined and history reviewed. Agree with above findings and plan. Blood pressure is improved. Will DC IV fluids and transfer to telemetry. Anticipate DC in am.  Theron Arista [facility] 10/01/2012 11:10 AM

## 2012-10-01 NOTE — Progress Notes (Signed)
CARDIAC REHAB PHASE I   PRE:  Rate/Rhythm: 80 SR with PACs up in room    BP: sitting 100/72    SaO2:   MODE:  Ambulation: 760 ft   POST:  Rate/Rhythm: 80 SR with PACs    BP: sitting 98/62     SaO2:   Tolerated well, no c/o. Ed completed. Pt does not feel he is ready to quit smoking right now. Gave resources. Not interested in CRPII but gave brochure if he changes his mind. [id number]  Harriet Masson CES, ACSM

## 2012-10-02 ENCOUNTER — Telehealth: Payer: Self-pay

## 2012-10-02 ENCOUNTER — Encounter (HOSPITAL_COMMUNITY): Payer: Self-pay | Admitting: Cardiology

## 2012-10-02 LAB — BASIC METABOLIC PANEL
CO2: 24 mEq/L (ref 19–32)
Chloride: 108 mEq/L (ref 96–112)
Creatinine, Ser: 1.01 mg/dL (ref 0.50–1.35)
GFR calc Af Amer: >90 mL/min (ref >90)
Potassium: 4 mEq/L (ref 3.5–5.1)
Sodium: 140 mEq/L (ref 135–145)

## 2012-10-02 LAB — HEPATIC FUNCTION PANEL
Bilirubin, Direct: <0.1 mg/dL (ref 0.0–0.3)
Total Protein: 5.8 g/dL — ABNORMAL LOW (ref 6.0–8.3)

## 2012-10-02 MED ORDER — PRASUGREL HCL 10 MG PO TABS: 10.0000 mg | ORAL_TABLET | Freq: Every day | ORAL | Status: DC

## 2012-10-02 MED ORDER — ATORVASTATIN CALCIUM 80 MG PO TABS: 80.0000 mg | ORAL_TABLET | Freq: Every day | ORAL | Status: DC

## 2012-10-02 MED ORDER — ASPIRIN 81 MG PO TBEC: 81.0000 mg | DELAYED_RELEASE_TABLET | Freq: Every day | ORAL | Status: AC

## 2012-10-02 MED ORDER — NITROGLYCERIN 0.4 MG SL SUBL: 0.4000 mg | SUBLINGUAL_TABLET | SUBLINGUAL | Status: DC | PRN

## 2012-10-02 MED ORDER — METOPROLOL TARTRATE 12.5 MG HALF TABLET: 12.5000 mg | ORAL_TABLET | Freq: Two times a day (BID) | ORAL | Status: DC

## 2012-10-02 MED ORDER — METOPROLOL TARTRATE 12.5 MG HALF TABLET
12.5000 mg | ORAL_TABLET | Freq: Two times a day (BID) | ORAL | Status: DC
  Administered 2012-10-02: 12.5 mg via ORAL
  Filled 2012-10-02 (×2): qty 1

## 2012-10-02 NOTE — Progress Notes (Signed)
   CARE MANAGEMENT NOTE 10/02/2012  Patient:  Jason Maldonado   Account Number:  [id number]  Date Initiated:  09/30/2012  Documentation initiated by:  Junius Creamer  Subjective/Objective Assessment:   adm w mi     Action/Plan:   lives w wife   Anticipated DC Date:     Anticipated DC Plan:        DC Planning Services  CM consult      Choice offered to / List presented to:             Status of service:  Completed, signed off Medicare Important Message given?   (If response is "NO", the following Medicare IM given date fields will be blank) Date Medicare IM given:   Date Additional Medicare IM given:    Discharge Disposition:  HOME/SELF CARE  Per UR Regulation:  Reviewed for med. necessity/level of care/duration of stay  If discussed at Long Length of Stay Meetings, dates discussed:    Comments:  10-02-12 [address] Tomi Bamberger, RN,BSN [phone] CM spoke to pt and he has effient card..CM called [facility] [address] and they have medication available. Co pay cost will be 35.00 and pt is aware of copay. No furhter needs from CM at this time.  2/17 0832 debbie dowell rn,bsn pt has effient 30day free card and copay assist card.

## 2012-10-02 NOTE — Progress Notes (Signed)
Discharge Summary   Patient ID: Jason Maldonado MRN: [id number], DOB/AGE: Sep 29, 1955 57 y.o.  Primary MD:  Primary Cardiologist: Dr. Swaziland Admit date: 09/27/2012 D/C date:     10/02/2012      Primary Discharge Diagnoses:  1. Acute Inferior STEMI  - Cath 2/14 single vessel occlusive CAD of the mid RCA with severe basal to midinferior wall hypokinesis and mild to moderate LV dysfunction, EF 45%; s/p DES to RCA  2. Cardiogenic Shock   3. Hyperlipidemia  - Initiated on statin, will need f/u lipids/LFTs in 6 weeks  4. Tobacco Abuse    Secondary Discharge Diagnoses:  . Bipolar 1 disorder  . Irregular heart beat    Allergies No Known Allergies  Diagnostic Studies/Procedures:   09/27/12 - Cardiac Cath Hemodynamics:  AO 99/64 with a mean of 79 mmHg  LV 94/14 mmHg  Coronary angiography:  Coronary dominance: right  Left mainstem: Normal.  Left anterior descending (LAD): There is mild calcification in the proximal LAD. There are minor irregularities less than 10-20%. The LAD gives rise to 2 diagonal branches which appear normal.  Left circumflex (LCx): The left circumflex is relatively small and gives rise to a single marginal branch. It is normal.  Right coronary artery (RCA): The right coronary is a very large dominant vessel. It is moderately calcified throughout the mid vessel. It has severe tortuosity in the mid vessel. The vessel is occluded in the mid vessel with TIMI grade 0 flow.  Left ventriculography: Left ventricular systolic function is abnormal, LVEF is estimated at 45 %, there is severe basal to midinferior wall hypokinesis, there is no significant mitral regurgitation  PCI Data:  Vessel - RCA/Segment - mid  Percent Stenosis (pre) 100%  TIMI-flow 0  Stent 3.5 x 32 mm Promus  Percent Stenosis (post) 0%  TIMI-flow (post) 3  Final Conclusions:  1. Single vessel occlusive coronary disease.  2. Mild to moderate left ventricular dysfunction  3. Successful  intracoronary stenting of the mid RCA with a drug-eluting stent.  Recommendations: Dual antiplatelet therapy for one year.  09/28/12 - Echo Study Conclusions Left ventricle: There is question of possible mild hypokinesis at the base of the inferior wall. However, this is very subtle. The cavity size was normal. Wall thickness was normal. The estimated ejection fraction was 55%.   History of Present Illness: 57 y.o. male w/ the above medical problems who presented to [facility] on 09/27/12 with chest pain and marked ST elevation in inferolateral leads with anterior ST depression. No prior history of heart disease. On day of presentation had sudden onset of severe substernal chest heaviness prompting him to seek medical attention.   Hospital Course: On arrival to the [facility] cath lab EKG showed NSR w/ 1st degree AVB 3-4 mm ST elevation II, III, aVF, V5-V6 with deep ST depression in I, aVL, V1-V3. Cardiac cath showed single vessel occlusive CAD of the mid RCA with severe basal to midinferior wall hypokinesis and mild to moderate LV dysfunction, EF 45%. He was successfully treated with a DES to the mid RCA. He tolerated the procedure well without complications. Recommendations were made for DAPT for at least one year and aggressive risk factor modification including tobacco cessation. Post cath he developed asymptomatic hypotension requiring IVF boluses and a dopamine drip. There were no signs of groin hematoma or retroperitoneal bleed and Hgb remained stable. There were concerns for RV infarct, but Echo showed improved EF 55% with only mild hypokinesis at the  base of the inferior wall and no pericardial effusion. His BP improved and the dopamine was weaned off. He remained hemodynamically stable and a low dose beta blocker was started on day of discharge. He had no recurrent chest pain and cath site remained stable.   He was seen and evaluated by Dr. Swaziland who felt he was stable for discharge home  with plans for follow up as scheduled below.  Discharge Vitals: Blood pressure 108/67, pulse 68, temperature 98.3 F (36.8 C), temperature source Oral, resp. rate 18, height 5\' 7"  (1.702 m), weight 147 lb 14.4 oz (67.087 kg), SpO2 97.00%.  Labs: Component Value Date   WBC 7.3 09/30/2012   HGB 12.3* 09/30/2012   HCT 35.7* 09/30/2012   MCV 80.0 09/30/2012   PLT 124* 09/30/2012   Lab 10/02/12 0530  NA 140  K 4.0  CL 108  CO2 24  BUN 15  CREATININE 1.01  CALCIUM 8.5  PROT 5.8*  BILITOT 0.3  ALKPHOS 55  ALT 18  AST 28  GLUCOSE 84   Component Value Date   CHOL 153 09/28/2012   HDL 37* 09/28/2012   LDLCALC 102* 09/28/2012   TRIG 72 09/28/2012      09/27/2012 12:09 09/27/2012 15:26 09/27/2012 22:21 09/28/2012 06:30  CK, MB 23.2 (HH)     CK Total 360 (H)     Troponin I 2.87 (HH) >20.00 (HH) >20.00 (HH) >20.00 (HH)      09/27/2012 15:26  Hemoglobin A1C 5.7 (H)      09/27/2012 15:26  TSH 0.698     Discharge Medications     Medication List    STOP taking these medications       ibuprofen 200 MG tablet  Commonly known as:  ADVIL,MOTRIN      TAKE these medications       aspirin 81 MG EC tablet  Take 1 tablet (81 mg total) by mouth daily.     atorvastatin 80 MG tablet  Commonly known as:  LIPITOR  Take 1 tablet (80 mg total) by mouth at bedtime.     lamoTRIgine 100 MG tablet  Commonly known as:  LAMICTAL  Take 100 mg by mouth daily.     metoprolol tartrate 12.5 mg Tabs  Commonly known as:  LOPRESSOR  Take 0.5 tablets (12.5 mg total) by mouth 2 (two) times daily.     nitroGLYCERIN 0.4 MG SL tablet  Commonly known as:  NITROSTAT  Place 1 tablet (0.4 mg total) under the tongue every 5 (five) minutes as needed for chest pain (up to 3 doses).     prasugrel 10 MG Tabs  Commonly known as:  EFFIENT  Take 1 tablet (10 mg total) by mouth daily.     risperiDONE 0.5 MG tablet  Commonly known as:  RISPERDAL  Take 0.5 mg by mouth daily.        Disposition   Discharge  Orders   Future Appointments Provider Department Dept Phone   10/09/2012 10:30 AM Rosalio Macadamia, NP [facility] Main Office [place]) [phone]   Future Orders Complete By Expires     Diet - low sodium heart healthy  As directed     Discharge instructions  As directed     Comments:      * Please take all medications as prescribed and bring them with you to your office visit  * KEEP GROIN & WRIST SITES CLEAN AND DRY. Call the office for any signs of bleedings, pus, swelling, increased pain,  or any other concerns. * NO HEAVY LIFTING (>10lbs) X 4 WEEKS. * NO SEXUAL ACTIVITY X 4 WEEKS. * NO DRIVING X 2 WEEKS. * NO SOAKING BATHS, HOT TUBS, POOLS, ETC., X 7 DAYS.  * Please stop smoking!  * You were started on a cholesterol medication (Lipitor) this hospitalization and will need follow up blood work (lipid panel and liver function tests) in 6wks    Increase activity slowly  As directed       Follow-up Information   Follow up with Norma Fredrickson, NP On 10/09/2012. (10:30)    Contact information:   [facility] [address] [place] [address] [phone]        Outstanding Labs/Studies:  lipids/LFTs in 6 weeks  Duration of Discharge Encounter: Greater than 30 minutes including physician and PA time.  Signed, Janai Maudlin PA-C 10/02/2012, 9:49 AM

## 2012-10-02 NOTE — Telephone Encounter (Signed)
TOC management phone call made to patient on 10/02/12.  LMTCB 10/02/12.  Patient's wife called on 10/02/12. Patient has been discharged home. Wife is concerned that patient will not be compliant with medications. Stated he has a history of not taking medications as ordered. Reviewed discharge medications individually for patient. Reviewed follow-up appt on 10/09/12 at 1030. Wife verbalized agreement that patient is aware of appt and she copied it down to show him again. Wife stated that she has no further questions and will call us back should they have any further questions and concerns.

## 2012-10-02 NOTE — Telephone Encounter (Signed)
PC to patient to verify upcoming follow up appt for 10/09/12 at 1030. LM on V/M.

## 2012-10-02 NOTE — Telephone Encounter (Signed)
New problem    Per Shanda Bumps PA - TOC patient appt with Lawson Fiscal on  3/3 @ 11:00

## 2012-10-02 NOTE — Progress Notes (Signed)
Patient seen and examined and history reviewed. Agree with above findings and plan. See my earlier rounding note.  [name] 10/02/2012 11:39 AM

## 2012-10-02 NOTE — Telephone Encounter (Signed)
Addendum   Per Shanda Bumps PA  . appt move up to  2/26 @ 8:30

## 2012-10-02 NOTE — Progress Notes (Signed)
   Patient Name: Jason Maldonado Date of Encounter: 10/02/2012  Principal Problem:   ST elevation myocardial infarction (STEMI) of inferior wall Active Problems:   Bipolar 1 disorder   Hyperlipidemia   Tobacco abuse   Cardiogenic shock    SUBJECTIVE: No chest pain or SOB, denies dizziness with ambulation. Wants to go home.  OBJECTIVE Filed Vitals:   10/01/12 0827 10/01/12 1205 10/01/12 2100 10/02/12 0500  BP:  102/61 110/61 108/67  Pulse: 82 69 73 68  Temp: 97.5 F (36.4 C) 98.5 F (36.9 C) 98.2 F (36.8 C) 98.3 F (36.8 C)  TempSrc: Oral Oral    Resp: 16 18 18 18   Height:      Weight:    147 lb 14.4 oz (67.087 kg)  SpO2: 98% 99% 97% 97%    Intake/Output Summary (Last 24 hours) at 10/02/12 0851 Last data filed at 10/02/12 0834  Gross per 24 hour  Intake    973 ml  Output    600 ml  Net    373 ml   Filed Weights   09/30/12 0500 10/01/12 0500 10/02/12 0500  Weight: 148 lb 5.9 oz (67.3 kg) 148 lb 9.4 oz (67.4 kg) 147 lb 14.4 oz (67.087 kg)   PHYSICAL EXAM General: Well developed, well nourished, male in no acute distress. Head: Normocephalic, atraumatic.  Neck: Supple without bruits, JVD not elevated. Lungs:  Resp regular and unlabored, CTA except a few rales. Heart: RRR, S1, S2, no S3, S4, or murmur. Abdomen: Soft, non-tender, non-distended, BS + x 4.  Extremities: No clubbing, cyanosis, no edema.  Neuro: Alert and oriented X 3. Moves all extremities spontaneously. Psych: Normal affect.  LABS: CBC:  Recent Labs  09/30/12 0700  WBC 7.3  HGB 12.3*  HCT 35.7*  MCV 80.0  PLT 124*   Basic Metabolic Panel:  Recent Labs  16/10/96 0700 10/02/12 0530  NA 138 140  K 3.9 4.0  CL 105 108  CO2 24 24  GLUCOSE 99 84  BUN 12 15  CREATININE 0.94 1.01  CALCIUM 8.6 8.5    TELE:  NSR  Radiology/Studies: No results found.  Current Medications:  . aspirin EC  81 mg Oral Daily  . atorvastatin  80 mg Oral q1800  . heparin  5,000 Units Subcutaneous Q8H    . lamoTRIgine  100 mg Oral Daily  . prasugrel  10 mg Oral Daily  . risperiDONE  0.5 mg Oral Daily  . sodium chloride  3 mL Intravenous Q12H   . sodium chloride 125 mL/hr at 10/01/12 0617  . DOPamine Stopped (09/30/12 2000)    ASSESSMENT AND PLAN: Principal Problem:   ST elevation myocardial infarction (STEMI) of inferior wall - S/P DES RCA 09/27/2012, continue DAPT, statin, add BB low dose now that BP has normalized.  Active Problems:   Cardiogenic shock - off DA since 2/17 at 8 pm BP now normal.    Bipolar 1 disorder - on home Rx    Hyperlipidemia - on high dose lipitor.    Tobacco abuse -  cessation advised.  Plan - DC home today. Recommend phase 2 cardiac Rehab.  Signed, Momen Ham Swaziland MD,FACC 8:51 AM 10/02/2012

## 2012-10-03 ENCOUNTER — Telehealth: Payer: Self-pay

## 2012-10-03 NOTE — Telephone Encounter (Signed)
**Note De-Identified  Obfuscation** Transition of care management call:  Pt's phone rings one time then busy. Will continue to call.

## 2012-10-04 ENCOUNTER — Telehealth: Payer: Self-pay

## 2012-10-09 ENCOUNTER — Encounter: Payer: Self-pay | Admitting: Nurse Practitioner

## 2012-10-09 ENCOUNTER — Ambulatory Visit (INDEPENDENT_AMBULATORY_CARE_PROVIDER_SITE_OTHER): Payer: Managed Care, Other (non HMO) | Admitting: Nurse Practitioner

## 2012-10-09 ENCOUNTER — Other Ambulatory Visit: Payer: Self-pay

## 2012-10-09 VITALS — BP 125/79 | HR 69 | Ht 67.0 in | Wt 146.0 lb

## 2012-10-09 DIAGNOSIS — I213 ST elevation (STEMI) myocardial infarction of unspecified site: Secondary | ICD-10-CM

## 2012-10-09 DIAGNOSIS — I219 Acute myocardial infarction, unspecified: Secondary | ICD-10-CM

## 2012-10-09 DIAGNOSIS — E785 Hyperlipidemia, unspecified: Secondary | ICD-10-CM

## 2012-10-09 NOTE — Patient Instructions (Addendum)
Keep trying to stop smoking  Stay on your same medicines  See Dr. Swaziland in 3 weeks with fasting labs  Goal with walking is up to 45 minutes a day  Call the [facility] Care office at [phone] if you have any questions, problems or concerns.

## 2012-10-09 NOTE — Progress Notes (Signed)
Jason Maldonado Date of Birth: 01/04/1956 Medical Record #[id number]  History of Present Illness: Mr. Gladu is seen back today for a post hospital visit. He is seen for Dr. [place]. He has had a recent STEMI with single vessel CAD - treated with DES to the RCA. EF was 45%. There was severe basal to midinferior wall HK and mild to moderate LV dysfunction. His echo however, shows normal LV function. This was associated with cardiogenic shock. He developed asymptomatic hypotension post procedure and had to be on Dopamine. Beta blocker was started at discharge. Other issues include HLD, tobacco abuse and bipolar disorder.   He comes in today. He is here alone. He is doing ok. Walking at home. Not going to be able to fit cardiac rehab into his schedule. Not short of breath. Smoking less but still smoking. Taking his medicines. No chest pain. Overall seems to be doing ok. Admits to lots of stress. Apparently has a restraining order against him and lots of issues in the court system.   Current Outpatient Prescriptions on File Prior to Visit  Medication Sig Dispense Refill  . aspirin EC 81 MG EC tablet Take 1 tablet (81 mg total) by mouth daily.      Marland Kitchen atorvastatin (LIPITOR) 80 MG tablet Take 1 tablet (80 mg total) by mouth at bedtime.  30 tablet  3  . lamoTRIgine (LAMICTAL) 100 MG tablet Take 100 mg by mouth daily.      . metoprolol tartrate (LOPRESSOR) 12.5 mg TABS Take 0.5 tablets (12.5 mg total) by mouth 2 (two) times daily.  60 tablet  60  . nitroGLYCERIN (NITROSTAT) 0.4 MG SL tablet Place 1 tablet (0.4 mg total) under the tongue every 5 (five) minutes as needed for chest pain (up to 3 doses).  25 tablet  3  . prasugrel (EFFIENT) 10 MG TABS Take 1 tablet (10 mg total) by mouth daily.  30 tablet  6  . risperiDONE (RISPERDAL) 0.5 MG tablet Take 0.5 mg by mouth daily.       No current facility-administered medications on file prior to visit.    No Known Allergies  Past Medical History  Diagnosis  Date  . Bipolar 1 disorder   . Tobacco abuse   . Irregular heart beat   . CAD (coronary artery disease) 09/27/12    STEMI 09/2012  single vessel occlusive CAD of the RCA s/p DES to mid RCA, EF 45%  . LV dysfunction     09/2012 Cath LV gram severe basal to midinferior wall hypokinesis, EF 45% and echo EF 55%  . Hyperlipidemia     History reviewed. No pertinent past surgical history.  History  Smoking status  . Current Every Day Smoker -- 1.00 packs/day for 40 years  . Types: Cigarettes  Smokeless tobacco  . Not on file    History  Alcohol Use No    Family History  Problem Relation Age of Onset  . CAD Father     deceased.    Review of Systems: The review of systems is per the HPI.  All other systems were reviewed and are negative.  Physical Exam: BP 125/79  Pulse 69  Ht 5\' 7"  (1.702 m)  Wt 146 lb (66.225 kg)  BMI 22.86 kg/m2 Patient is in no acute distress. Affect is quite flat. Skin is warm and dry. Color is sallow.  HEENT is unremarkable. Normocephalic/atraumatic. PERRL. Sclera are nonicteric. Neck is supple. No masses. No JVD. Lungs are clear. Cardiac exam  shows a regular rate and rhythm. Abdomen is soft. Extremities are without edema. Gait and ROM are intact. No gross neurologic deficits noted.  LABORATORY DATA:  Lab Results  Component Value Date   WBC 7.3 09/30/2012   HGB 12.3* 09/30/2012   HCT 35.7* 09/30/2012   PLT 124* 09/30/2012   GLUCOSE 84 10/02/2012   CHOL 153 09/28/2012   TRIG 72 09/28/2012   HDL 37* 09/28/2012   LDLCALC 102* 09/28/2012   ALT 18 10/02/2012   AST 28 10/02/2012   NA 140 10/02/2012   K 4.0 10/02/2012   CL 108 10/02/2012   CREATININE 1.01 10/02/2012   BUN 15 10/02/2012   CO2 24 10/02/2012   TSH 0.698 09/27/2012   INR 1.18 09/27/2012   HGBA1C 5.7* 09/27/2012   Lab Results  Component Value Date   CKTOTAL 360* 09/27/2012   CKMB 23.2* 09/27/2012   TROPONINI >20.00* 09/28/2012     Coronary angiography:   Left mainstem: Normal.  Left anterior  descending (LAD): There is mild calcification in the proximal LAD. There are minor irregularities less than 10-20%. The LAD gives rise to 2 diagonal branches which appear normal.  Left circumflex (LCx): The left circumflex is relatively small and gives rise to a single marginal branch. It is normal.  Right coronary artery (RCA): The right coronary is a very large dominant vessel. It is moderately calcified throughout the mid vessel. It has severe tortuosity in the mid vessel. The vessel is occluded in the mid vessel with TIMI grade 0 flow.   Left ventriculography: Left ventricular systolic function is abnormal, LVEF is estimated at 45 %, there is severe basal to midinferior wall hypokinesis, there is no significant mitral regurgitation   PCI Procedure Note: Following the diagnostic procedure, the decision was made to proceed with PCI. Effient 60 mg was given orally. Weight-based bivalirudin was given for anticoagulation. Once a therapeutic ACT was achieved, a 6 [place] FR4 guide catheter was inserted. A pro-water coronary guidewire was used to cross the lesion. The lesion was predilated with a 2.0 mm balloon. We then predilated the lesion with a 3.0 mm balloon. There was a long segment of disease with marked tortuosity. We attempted to pass a stent but were unable to pass it through the lesion because of severe tortuosity despite excellent guide backup. We used a guide liner catheter and were able to advance this to the proximal lesion. Even with this excellent position we were unable to cross lesion with a stent. We then dilated the lesion further with a 3.5 mm noncompliant balloon. At this point we're able to pass the guide liner through the lesion itself. We were able to then pass the stent across the lesion. The lesion was then stented with a 3.5 x 32 mm Promus stent. The stent was postdilated with a 4.0 mm noncompliant balloon. Following PCI, there was 0% residual stenosis and TIMI-3 flow. Final  angiography confirmed an excellent result. Femoral hemostasis was achieved with manual compression. The patient tolerated the PCI procedure well. There were no immediate procedural complications. The patient was transferred to the post catheterization recovery area for further monitoring.   PCI Data:  Vessel - RCA/Segment - mid  Percent Stenosis (pre) 100%  TIMI-flow 0  Stent 3.5 x 32 mm Promus  Percent Stenosis (post) 0%  TIMI-flow (post) 3   Final Conclusions:  1. Single vessel occlusive coronary disease.  2. Mild to moderate left ventricular dysfunction  3. Successful intracoronary stenting of the mid RCA  with a drug-eluting stent.   Recommendations: Dual antiplatelet therapy for one year.  Theron [facility]  09/27/2012, 12:57 PM  Echo Study Conclusions  Left ventricle: There is question of possible mild hypokinesis at the base of the inferior wall. However, this is very subtle. The cavity size was normal. Wall thickness was normal. The estimated ejection fraction was 55%.   Assessment / Plan: 1. STEMI - s/p DES to the RCA - he is reminded of the importance of taking his Effient for the next one year. No further chest pain. Seems to be doing well. Not going to go to rehab. Date to return to work was put as March 14th. He will see Dr. Swaziland back in 3 weeks with fasting labs.  2. HLD - now on statin  3. Tobacco abuse - trying to smoke less. Cessation is encourged.  4. Bipolar disease  Patient is agreeable to this plan and will call if any problems develop in the interim.

## 2012-10-14 ENCOUNTER — Encounter: Payer: Managed Care, Other (non HMO) | Admitting: Nurse Practitioner

## 2012-10-14 ENCOUNTER — Telehealth: Payer: Self-pay | Admitting: Cardiology

## 2012-10-14 NOTE — Telephone Encounter (Signed)
New problem   1.Criminal hearing on  3/10 . Was advise  By his lawyer to have a note stating his condition . That will allow the patient to have a continuous.

## 2012-10-14 NOTE — Telephone Encounter (Signed)
Called patient and advised that Dr.Jordan is not in the office until 3/5 and patient has an appointment with him on that day so the note can be completed on 3/5.

## 2012-10-16 ENCOUNTER — Ambulatory Visit (INDEPENDENT_AMBULATORY_CARE_PROVIDER_SITE_OTHER): Payer: Managed Care, Other (non HMO) | Admitting: Cardiology

## 2012-10-16 ENCOUNTER — Other Ambulatory Visit (INDEPENDENT_AMBULATORY_CARE_PROVIDER_SITE_OTHER): Payer: Managed Care, Other (non HMO)

## 2012-10-16 ENCOUNTER — Encounter: Payer: Self-pay | Admitting: Cardiology

## 2012-10-16 VITALS — BP 126/86 | HR 83 | Ht 67.0 in | Wt 145.1 lb

## 2012-10-16 DIAGNOSIS — I2510 Atherosclerotic heart disease of native coronary artery without angina pectoris: Secondary | ICD-10-CM

## 2012-10-16 DIAGNOSIS — Z720 Tobacco use: Secondary | ICD-10-CM

## 2012-10-16 DIAGNOSIS — I2119 ST elevation (STEMI) myocardial infarction involving other coronary artery of inferior wall: Secondary | ICD-10-CM

## 2012-10-16 DIAGNOSIS — F17200 Nicotine dependence, unspecified, uncomplicated: Secondary | ICD-10-CM

## 2012-10-16 DIAGNOSIS — E785 Hyperlipidemia, unspecified: Secondary | ICD-10-CM

## 2012-10-16 DIAGNOSIS — I519 Heart disease, unspecified: Secondary | ICD-10-CM

## 2012-10-16 LAB — LIPID PANEL
Cholesterol: 137 mg/dL (ref 0–200)
HDL: 43.1 mg/dL (ref >39.00)
LDL Cholesterol: 82 mg/dL (ref 0–99)
Total CHOL/HDL Ratio: 3
Triglycerides: 59 mg/dL (ref 0.0–149.0)
VLDL: 11.8 mg/dL (ref 0.0–40.0)

## 2012-10-16 LAB — HEPATIC FUNCTION PANEL
ALT: 15 U/L (ref 0–53)
AST: 16 U/L (ref 0–37)
Albumin: 3.9 g/dL (ref 3.5–5.2)
Alkaline Phosphatase: 71 U/L (ref 39–117)
Bilirubin, Direct: 0 mg/dL (ref 0.0–0.3)
Total Bilirubin: 0.5 mg/dL (ref 0.3–1.2)
Total Protein: 6.7 g/dL (ref 6.0–8.3)

## 2012-10-16 LAB — BASIC METABOLIC PANEL
BUN: 14 mg/dL (ref 6–23)
CO2: 26 mEq/L (ref 19–32)
Calcium: 9.1 mg/dL (ref 8.4–10.5)
Chloride: 103 mEq/L (ref 96–112)
Creatinine, Ser: 1.3 mg/dL (ref 0.4–1.5)
GFR: 62.88 mL/min (ref >60.00)
Glucose, Bld: 104 mg/dL — ABNORMAL HIGH (ref 70–99)
Potassium: 4.2 mEq/L (ref 3.5–5.1)
Sodium: 138 mEq/L (ref 135–145)

## 2012-10-16 NOTE — Patient Instructions (Signed)
Continue your current medication.  Try and stop smoking.  You need to see Dr. Tomasa Rand to help deal with your stress and anxiety.  I will see you in 3 months with fasting lab work.

## 2012-10-16 NOTE — Progress Notes (Signed)
Jason Maldonado Date of Birth: September 28, 1955 Medical Record #[id number]  History of Present Illness: [name] is seen for follow up today. He is s/p STEMI with single vessel CAD - treated with DES to the RCA on 09/27/12. EF was 45%. There was severe basal to midinferior wall HK and mild to moderate LV dysfunction. His echo however, shows normal LV function. This was associated with cardiogenic shock with persistent hypotension despite normal RV function by Echo. Other issues include HLD, tobacco abuse and bipolar disorder.  On followup today he reports that he is doing well from a cardiac standpoint. He is still under a great deal of stress and anxiety. He is dealing with marital and legal issues and has a restraining order against him. He denies any recurrent chest pain or shortness of breath. He is walking everyday. Has reduced his smoking to less than one half pack per day.  Current Outpatient Prescriptions on File Prior to Visit  Medication Sig Dispense Refill  . aspirin EC 81 MG EC tablet Take 1 tablet (81 mg total) by mouth daily.      Marland Kitchen atorvastatin (LIPITOR) 80 MG tablet Take 1 tablet (80 mg total) by mouth at bedtime.  30 tablet  3  . lamoTRIgine (LAMICTAL) 100 MG tablet Take 100 mg by mouth daily.      . metoprolol tartrate (LOPRESSOR) 12.5 mg TABS Take 0.5 tablets (12.5 mg total) by mouth 2 (two) times daily.  60 tablet  60  . nitroGLYCERIN (NITROSTAT) 0.4 MG SL tablet Place 1 tablet (0.4 mg total) under the tongue every 5 (five) minutes as needed for chest pain (up to 3 doses).  25 tablet  3  . prasugrel (EFFIENT) 10 MG TABS Take 1 tablet (10 mg total) by mouth daily.  30 tablet  6  . risperiDONE (RISPERDAL) 0.5 MG tablet Take 0.5 mg by mouth daily.       No current facility-administered medications on file prior to visit.    No Known Allergies  Past Medical History  Diagnosis Date  . Bipolar 1 disorder   . Tobacco abuse   . Irregular heart beat   . CAD (coronary artery  disease) 09/27/12    STEMI 09/2012  single vessel occlusive CAD of the RCA s/p DES to mid RCA, EF 45%  . LV dysfunction     09/2012 Cath LV gram severe basal to midinferior wall hypokinesis, EF 45% and echo EF 55%  . Hyperlipidemia     History reviewed. No pertinent past surgical history.  History  Smoking status  . Current Every Day Smoker -- 1.00 packs/day for 40 years  . Types: Cigarettes  Smokeless tobacco  . Not on file    History  Alcohol Use No    Family History  Problem Relation Age of Onset  . CAD Father     deceased.    Review of Systems: The review of systems is per the HPI.  All other systems were reviewed and are negative.  Physical Exam: BP 126/86  Pulse 83  Ht 5\' 7"  (1.702 m)  Wt 145 lb 1.9 oz (65.826 kg)  BMI 22.72 kg/m2  SpO2 99% He is an anxious white male in no acute distress. Skin is warm and dry. Color is pale.  HEENT is unremarkable. Normocephalic/atraumatic. PERRL. Sclera are nonicteric. Neck is supple. No masses. No JVD. Lungs are clear. Cardiac exam shows a regular rate and rhythm. Abdomen is soft. Extremities are without edema. Pulses are good. Gait  and ROM are intact. No gross neurologic deficits noted.  LABORATORY DATA: Fasting labs are pending today.  Assessment / Plan: 1.  Coronary disease status post STEMI - s/p DES to the RCA - he is reminded of the importance of taking his Effient for the next one year. No further chest pain. He seems to be making a good recovery from his myocardial infarction. Blood pressure is normal. We have okayed him to return to work full-time on March 17. I've written a letter on his behalf to avoid stressful situations over the next month.   2. HLD - now on statin, we'll check fasting lab work today.   3. Tobacco abuse - trying to smoke less. Cessation is encourged.  4. Situational stress and anxiety. I strongly recommended he confer with his psychiatrist concerning these issues. 4. Bipolar disease  Patient is  agreeable to this plan and will call if any problems develop in the interim.

## 2012-10-24 ENCOUNTER — Telehealth: Payer: Self-pay | Admitting: Cardiology

## 2012-10-24 NOTE — Telephone Encounter (Signed)
New problem    Status of return to work date.

## 2012-10-24 NOTE — Telephone Encounter (Signed)
Jason Maldonado is advised that pt may return to work on 10/28/12, she verbalized understanding.

## 2012-11-11 ENCOUNTER — Telehealth: Payer: Self-pay | Admitting: Cardiology

## 2012-11-11 NOTE — Telephone Encounter (Signed)
Spoke with patient he wanted to know if ok to change from risperdal to depakote,wants to make sure depakote want interfer with heart medication.Message sent to Dr.Jordan for advice.

## 2012-11-11 NOTE — Telephone Encounter (Signed)
New  Problem   Pt is taking Risperdal for bipolar and his Dr want to switch him to Depicote, pt want to know if he would be able to take this medication because of his heart problems. Pt would like to speak to a nurse concerning this matter.

## 2012-11-12 NOTE — Telephone Encounter (Signed)
OK to take depakote from my standpoint.   Nika Yazzie Sw[place]iland MD, [facility]

## 2012-11-12 NOTE — Telephone Encounter (Signed)
Spoke with patient was told ok with Dr.Jordan to take Depakote.

## 2012-12-04 ENCOUNTER — Other Ambulatory Visit: Payer: Self-pay | Admitting: Radiology

## 2012-12-04 ENCOUNTER — Ambulatory Visit (INDEPENDENT_AMBULATORY_CARE_PROVIDER_SITE_OTHER): Payer: Managed Care, Other (non HMO) | Admitting: Emergency Medicine

## 2012-12-04 VITALS — BP 110/78 | HR 64 | Temp 99.2°F | Resp 16 | Ht 67.0 in | Wt 151.0 lb

## 2012-12-04 DIAGNOSIS — B029 Zoster without complications: Secondary | ICD-10-CM

## 2012-12-04 MED ORDER — GABAPENTIN 300 MG PO CAPS: ORAL_CAPSULE | ORAL | Status: DC

## 2012-12-04 MED ORDER — VALACYCLOVIR HCL 1 G PO TABS: 1000.0000 mg | ORAL_TABLET | Freq: Three times a day (TID) | ORAL | Status: DC

## 2012-12-04 MED ORDER — VALACYCLOVIR HCL 1 G PO TABS: 1000.0000 mg | ORAL_TABLET | Freq: Two times a day (BID) | ORAL | Status: DC

## 2012-12-04 NOTE — Telephone Encounter (Signed)
Called pharmacy to clarify sig on Rx's

## 2012-12-04 NOTE — Patient Instructions (Addendum)

## 2012-12-04 NOTE — Progress Notes (Signed)
  Subjective:    Patient ID: Jason Maldonado, male    DOB: 1956/07/13, 57 y.o.   MRN: [id number]  HPI Patient presents with rash right anterior chest and flank area. He first noticed symptoms about a week ago but over the last few days has developed increasing blisterlike rash. He has not been vaccinated for shingles   Review of Systems     Objective:   Physical Exam Patient has a herpetic outbreak at the T4-5 level on the right. There is evidence of new lesions noted. Patient has significant discomfort with any movement of the right arm         Assessment & Plan:   Meds ordered this encounter  Medications  . divalproex (DEPAKOTE) 250 MG DR tablet    Sig: Take 500 mg by mouth 2 (two) times daily.  . valACYclovir (VALTREX) 1000 MG tablet    Sig: Take 1 tablet (1,000 mg total) by mouth 2 (two) times daily.    Dispense:  20 tablet    Refill:  0

## 2013-01-22 ENCOUNTER — Encounter: Payer: Self-pay | Admitting: Cardiology

## 2013-01-22 ENCOUNTER — Ambulatory Visit (INDEPENDENT_AMBULATORY_CARE_PROVIDER_SITE_OTHER): Payer: Managed Care, Other (non HMO) | Admitting: Cardiology

## 2013-01-22 VITALS — BP 130/80 | HR 84 | Ht 67.0 in | Wt 145.0 lb

## 2013-01-22 DIAGNOSIS — E785 Hyperlipidemia, unspecified: Secondary | ICD-10-CM

## 2013-01-22 DIAGNOSIS — F17200 Nicotine dependence, unspecified, uncomplicated: Secondary | ICD-10-CM

## 2013-01-22 DIAGNOSIS — I2510 Atherosclerotic heart disease of native coronary artery without angina pectoris: Secondary | ICD-10-CM

## 2013-01-22 DIAGNOSIS — Z720 Tobacco use: Secondary | ICD-10-CM

## 2013-01-22 DIAGNOSIS — I519 Heart disease, unspecified: Secondary | ICD-10-CM

## 2013-01-22 LAB — BASIC METABOLIC PANEL
BUN: 10 mg/dL (ref 6–23)
CO2: 30 mEq/L (ref 19–32)
Calcium: 9.5 mg/dL (ref 8.4–10.5)
Chloride: 106 mEq/L (ref 96–112)
Creatinine, Ser: 1.1 mg/dL (ref 0.4–1.5)
GFR: 70.59 mL/min (ref >60.00)
Glucose, Bld: 95 mg/dL (ref 70–99)
Potassium: 5.3 mEq/L — ABNORMAL HIGH (ref 3.5–5.1)
Sodium: 139 mEq/L (ref 135–145)

## 2013-01-22 LAB — HEPATIC FUNCTION PANEL
ALT: 27 U/L (ref 0–53)
AST: 22 U/L (ref 0–37)
Albumin: 4 g/dL (ref 3.5–5.2)
Alkaline Phosphatase: 50 U/L (ref 39–117)
Bilirubin, Direct: 0 mg/dL (ref 0.0–0.3)
Total Bilirubin: 0.8 mg/dL (ref 0.3–1.2)
Total Protein: 7.1 g/dL (ref 6.0–8.3)

## 2013-01-22 LAB — LIPID PANEL
HDL: 50.6 mg/dL (ref >39.00)
Triglycerides: 62 mg/dL (ref 0.0–149.0)

## 2013-01-22 MED ORDER — ATORVASTATIN CALCIUM 80 MG PO TABS: 80.0000 mg | ORAL_TABLET | Freq: Every day | ORAL | Status: DC

## 2013-01-22 NOTE — Patient Instructions (Signed)
We will call with the results of your lab work today.  I encourage you to get more exercise- walking  Stop smoking completely  I will see you in 6 months.

## 2013-01-22 NOTE — Progress Notes (Signed)
Jason Maldonado Date of Birth: [birth year] Medical Record #[id number]  History of Present Illness: Mr. Sitter is seen for follow up today. He is s/p STEMI with single vessel CAD - treated with DES to the RCA on 09/27/12. EF was 45%. There was severe basal to midinferior wall HK and mild to moderate LV dysfunction. His echo however, shows normal LV function with an Other issues include HLD, tobacco abuse and bipolar disorder. Patient reports that he is doing well from a cardiac standpoint. He denies any symptoms of angina or shortness of breath. He walks a lot at work but doesn't do any 1 exercise. He does complain of feeling more tired and he attributes this to family stressors. He did have a case of shingles involving his right thorax that has resolved. He does continue to smoke but is trying to cut down and quit.  Current Outpatient Prescriptions on File Prior to Visit  Medication Sig Dispense Refill  . aspirin EC 81 MG EC tablet Take 1 tablet (81 mg total) by mouth daily.      Marland Kitchen lamoTRIgine (LAMICTAL) 100 MG tablet Take 100 mg by mouth daily.      . metoprolol tartrate (LOPRESSOR) 12.5 mg TABS Take 0.5 tablets (12.5 mg total) by mouth 2 (two) times daily.  60 tablet  60  . nitroGLYCERIN (NITROSTAT) 0.4 MG SL tablet Place 1 tablet (0.4 mg total) under the tongue every 5 (five) minutes as needed for chest pain (up to 3 doses).  25 tablet  3  . prasugrel (EFFIENT) 10 MG TABS Take 1 tablet (10 mg total) by mouth daily.  30 tablet  6   No current facility-administered medications on file prior to visit.    No Known Allergies  Past Medical History  Diagnosis Date  . Bipolar 1 disorder   . Tobacco abuse   . Irregular heart beat   . CAD (coronary artery disease) 09/27/12    STEMI 09/2012  single vessel occlusive CAD of the RCA s/p DES to mid RCA, EF 45%  . LV dysfunction     09/2012 Cath LV gram severe basal to midinferior wall hypokinesis, EF 45% and echo EF 55%  . Hyperlipidemia     History  reviewed. No pertinent past surgical history.  History  Smoking status  . Current Every Day Smoker -- 1.00 packs/day for 40 years  . Types: Cigarettes  Smokeless tobacco  . Not on file    History  Alcohol Use No    Family History  Problem Relation Age of Onset  . CAD Father     deceased.    Review of Systems: The review of systems is per the HPI.  All other systems were reviewed and are negative.  Physical Exam: BP 130/80  Pulse 84  Ht 5\' 7"  (1.702 m)  Wt 145 lb (65.772 kg)  BMI 22.71 kg/m2  SpO2 99% He is an anxious white male in no acute distress. Skin is warm and dry. Color is pale.  HEENT is unremarkable. Normocephalic/atraumatic. PERRL. Sclera are nonicteric. Neck is supple. No masses. No JVD. Lungs are clear. Cardiac exam shows a regular rate and rhythm. Abdomen is soft. Extremities are without edema. Pulses are good. Gait and ROM are intact. No gross neurologic deficits noted.  LABORATORY DATA: Fasting labs are pending today.  Assessment / Plan: 1.  Coronary disease status post STEMI - s/p DES to the RCA - he is reminded of the importance of taking his Effient for one  year. No further chest pain. Have encouraged him to get some aerobic exercise regularly.  2. HLD - now on statin, we'll check fasting lab work today.   3. Tobacco abuse - trying to smoke less. Cessation is encourged.  4. Situational stress and anxiety. He does have regular psychiatric followup.  4. Bipolar disease

## 2013-03-22 ENCOUNTER — Encounter: Payer: Self-pay | Admitting: Gastroenterology

## 2013-03-26 ENCOUNTER — Encounter: Payer: Self-pay | Admitting: Cardiology

## 2013-03-26 NOTE — Progress Notes (Signed)
See previous 01/22/13 note

## 2013-03-31 ENCOUNTER — Other Ambulatory Visit (HOSPITAL_COMMUNITY): Payer: Self-pay | Admitting: Cardiology

## 2013-04-27 ENCOUNTER — Other Ambulatory Visit (HOSPITAL_COMMUNITY): Payer: Self-pay | Admitting: Cardiology

## 2013-10-29 ENCOUNTER — Other Ambulatory Visit (HOSPITAL_COMMUNITY): Payer: Self-pay | Admitting: Cardiology

## 2013-11-03 ENCOUNTER — Encounter: Payer: Self-pay | Admitting: Gastroenterology

## 2013-12-11 ENCOUNTER — Encounter: Payer: Self-pay | Admitting: Cardiology

## 2013-12-11 ENCOUNTER — Ambulatory Visit (INDEPENDENT_AMBULATORY_CARE_PROVIDER_SITE_OTHER): Payer: BC Managed Care – PPO | Admitting: Cardiology

## 2013-12-11 VITALS — BP 138/82 | HR 72 | Ht 67.0 in | Wt 146.8 lb

## 2013-12-11 DIAGNOSIS — I2510 Atherosclerotic heart disease of native coronary artery without angina pectoris: Secondary | ICD-10-CM

## 2013-12-11 DIAGNOSIS — E785 Hyperlipidemia, unspecified: Secondary | ICD-10-CM

## 2013-12-11 DIAGNOSIS — F17200 Nicotine dependence, unspecified, uncomplicated: Secondary | ICD-10-CM

## 2013-12-11 DIAGNOSIS — Z720 Tobacco use: Secondary | ICD-10-CM

## 2013-12-11 MED ORDER — METOPROLOL TARTRATE 25 MG PO TABS: 12.5000 mg | ORAL_TABLET | Freq: Two times a day (BID) | ORAL | Status: DC

## 2013-12-11 NOTE — Patient Instructions (Addendum)
Stop taking Effient   Continue your other therapy  We will check blood work on you today.  I will see you in 6 months.  Try and stop smoking

## 2013-12-11 NOTE — Progress Notes (Signed)
   Jason KochMichael J Maldonado Date of Birth: 04-02-1956 Medical Record #[id number]  History of Present Illness: [name] is seen for follow up today. He is s/p STEMI with single vessel CAD - treated with DES to the RCA on 09/27/12. EF was 45%. There was severe basal to midinferior wall HK and mild to moderate LV dysfunction. His echo however, showed normal LV function with an Other issues include HLD, tobacco abuse and bipolar disorder. Patient reports that he is struggling with his bipolar disorder. He has a lot of stress and anxiety. He continues to smoke. He is on Seroquel now but states this makes him very fatigue. No chest pain or SOB.   Current Outpatient Prescriptions on File Prior to Visit  Medication Sig Dispense Refill  . aspirin EC 81 MG EC tablet Take 1 tablet (81 mg total) by mouth daily.      Marland Kitchen. atorvastatin (LIPITOR) 80 MG tablet Take 1 tablet (80 mg total) by mouth at bedtime.  90 tablet  3  . nitroGLYCERIN (NITROSTAT) 0.4 MG SL tablet Place 1 tablet (0.4 mg total) under the tongue every 5 (five) minutes as needed for chest pain (up to 3 doses).  25 tablet  3   No current facility-administered medications on file prior to visit.    No Known Allergies  Past Medical History  Diagnosis Date  . Bipolar 1 disorder   . Tobacco abuse   . Irregular heart beat   . CAD (coronary artery disease) 09/27/12    STEMI 09/2012  single vessel occlusive CAD of the RCA s/p DES to mid RCA, EF 45%  . LV dysfunction     09/2012 Cath LV gram severe basal to midinferior wall hypokinesis, EF 45% and echo EF 55%  . Hyperlipidemia     History reviewed. No pertinent past surgical history.  History  Smoking status  . Current Every Day Smoker -- 1.00 packs/day for 40 years  . Types: Cigarettes  Smokeless tobacco  . Not on file    History  Alcohol Use No    Family History  Problem Relation Age of Onset  . CAD Father     deceased.    Review of Systems: The review of systems is per the HPI.  All  other systems were reviewed and are negative.  Physical Exam: BP 138/82  Pulse 72  Ht 5\' 7"  (1.702 m)  Wt 146 lb 12.8 oz (66.588 kg)  BMI 22.99 kg/m2 He is an anxious white male in no acute distress. Skin is warm and dry. Color is pale.  HEENT is unremarkable. Normocephalic/atraumatic. PERRL. Sclera are nonicteric. Neck is supple. No masses. No JVD. Lungs are clear. Cardiac exam shows a regular rate and rhythm. Abdomen is soft. Extremities are without edema. Pulses are good. Gait and ROM are intact. No gross neurologic deficits noted.  LABORATORY DATA: Ecg: NSR with possible inferior infarct-old.  Assessment / Plan: 1.  Coronary disease status post STEMI - s/p DES to the RCA. No further chest pain. Have encouraged him to get some aerobic exercise regularly. Recommend he stop taking Effient at this point. Continue metoprolol and ASA.  2. HLD -  on statin, we'll check fasting lab work today.   3. Tobacco abuse -  Cessation is encourged.  4. Situational stress and anxiety.   4. Bipolar disease- followed by psychiatry.

## 2013-12-12 LAB — HEPATIC FUNCTION PANEL
ALT: 24 U/L (ref 0–53)
AST: 22 U/L (ref 0–37)
Albumin: 3.9 g/dL (ref 3.5–5.2)
Alkaline Phosphatase: 56 U/L (ref 39–117)
BILIRUBIN TOTAL: 0.6 mg/dL (ref 0.3–1.2)
Bilirubin, Direct: 0 mg/dL (ref 0.0–0.3)
TOTAL PROTEIN: 6.3 g/dL (ref 6.0–8.3)

## 2013-12-12 LAB — CBC WITH DIFFERENTIAL/PLATELET
BASOS PCT: 0.5 % (ref 0.0–3.0)
Basophils Absolute: 0 10*3/uL (ref 0.0–0.1)
EOS ABS: 0.2 10*3/uL (ref 0.0–0.7)
Eosinophils Relative: 3.7 % (ref 0.0–5.0)
HCT: 36.8 % — ABNORMAL LOW (ref 39.0–52.0)
Hemoglobin: 12.4 g/dL — ABNORMAL LOW (ref 13.0–17.0)
LYMPHS PCT: 27.9 % (ref 12.0–46.0)
Lymphs Abs: 1.7 10*3/uL (ref 0.7–4.0)
MCHC: 33.7 g/dL (ref 30.0–36.0)
MCV: 83.3 fl (ref 78.0–100.0)
MONO ABS: 0.4 10*3/uL (ref 0.1–1.0)
Monocytes Relative: 5.9 % (ref 3.0–12.0)
NEUTROS PCT: 62 % (ref 43.0–77.0)
Neutro Abs: 3.7 10*3/uL (ref 1.4–7.7)
PLATELETS: 157 10*3/uL (ref 150.0–400.0)
RBC: 4.42 Mil/uL (ref 4.22–5.81)
RDW: 14.3 % (ref 11.5–14.6)
WBC: 5.9 10*3/uL (ref 4.5–10.5)

## 2013-12-12 LAB — LIPID PANEL
CHOLESTEROL: 140 mg/dL (ref 0–200)
HDL: 44.9 mg/dL (ref >39.00)
LDL Cholesterol: 75 mg/dL (ref 0–99)
TRIGLYCERIDES: 103 mg/dL (ref 0.0–149.0)
Total CHOL/HDL Ratio: 3
VLDL: 20.6 mg/dL (ref 0.0–40.0)

## 2013-12-16 ENCOUNTER — Telehealth: Payer: Self-pay | Admitting: Cardiology

## 2013-12-16 NOTE — Telephone Encounter (Signed)
Returned call to patient copy of recent lab mailed to patient's home.

## 2013-12-16 NOTE — Telephone Encounter (Signed)
New message     Want a copy of lab work---can you scan it into their email account--[email or web address].  If you cannot scan---mail a copy please

## 2013-12-22 ENCOUNTER — Telehealth: Payer: Self-pay | Admitting: Cardiology

## 2013-12-22 NOTE — Telephone Encounter (Signed)
New message     Darlene calling back to speak with nurse Elnita Maxwellheryl regarding patient condition.  Agustin CreeDarlene is aware that nurse is off today.

## 2013-12-23 NOTE — Telephone Encounter (Signed)
Returned call to patient's wife no answer.LMTC. 

## 2013-12-25 NOTE — Telephone Encounter (Signed)
Spoke to patient's wife Darlene.She stated when husband saw Dr.Jordan he forgot to mention he wanted a Viagra prescription.Dr.Jordan out of office this afternoon,will check with tomorrow and call back.

## 2013-12-26 MED ORDER — SILDENAFIL CITRATE 50 MG PO TABS: 50.0000 mg | ORAL_TABLET | Freq: Every day | ORAL | Status: DC | PRN

## 2013-12-26 NOTE — Telephone Encounter (Signed)
Patient called Dr.Jordan prescribed Viagra 50 mg as directed.Patient warned not to take NTG after taking Viagra.Advised to wait 24 hrs.after taking a Viagra before he could take a NTG.

## 2014-03-13 ENCOUNTER — Other Ambulatory Visit: Payer: Self-pay

## 2014-03-13 MED ORDER — ATORVASTATIN CALCIUM 80 MG PO TABS: 80.0000 mg | ORAL_TABLET | Freq: Every day | ORAL | Status: DC

## 2014-07-23 ENCOUNTER — Encounter (HOSPITAL_COMMUNITY): Payer: Self-pay | Admitting: Cardiology

## 2014-09-03 ENCOUNTER — Other Ambulatory Visit: Payer: Self-pay

## 2014-09-03 MED ORDER — ATORVASTATIN CALCIUM 80 MG PO TABS: 80.0000 mg | ORAL_TABLET | Freq: Every day | ORAL | Status: DC

## 2014-09-14 ENCOUNTER — Encounter: Payer: Self-pay | Admitting: Gastroenterology

## 2015-03-10 ENCOUNTER — Inpatient Hospital Stay (HOSPITAL_COMMUNITY)
Admission: EM | Admit: 2015-03-10 | Discharge: 2015-03-12 | DRG: 247 | Disposition: A | Discharge status: Home / Self Care | Payer: BLUE CROSS/BLUE SHIELD | Attending: Cardiovascular Disease | Admitting: Cardiovascular Disease

## 2015-03-10 ENCOUNTER — Encounter (HOSPITAL_COMMUNITY): Payer: Self-pay | Admitting: Nurse Practitioner

## 2015-03-10 ENCOUNTER — Encounter (HOSPITAL_COMMUNITY)
Admission: EM | Disposition: A | Discharge status: Home / Self Care | Payer: Self-pay | Source: Home / Self Care | Attending: Cardiovascular Disease

## 2015-03-10 DIAGNOSIS — I2111 ST elevation (STEMI) myocardial infarction involving right coronary artery: Secondary | ICD-10-CM | POA: Diagnosis present

## 2015-03-10 DIAGNOSIS — F17210 Nicotine dependence, cigarettes, uncomplicated: Secondary | ICD-10-CM | POA: Diagnosis present

## 2015-03-10 DIAGNOSIS — E785 Hyperlipidemia, unspecified: Secondary | ICD-10-CM | POA: Diagnosis present

## 2015-03-10 DIAGNOSIS — Z8249 Family history of ischemic heart disease and other diseases of the circulatory system: Secondary | ICD-10-CM

## 2015-03-10 DIAGNOSIS — Z7982 Long term (current) use of aspirin: Secondary | ICD-10-CM

## 2015-03-10 DIAGNOSIS — I252 Old myocardial infarction: Secondary | ICD-10-CM

## 2015-03-10 DIAGNOSIS — R079 Chest pain, unspecified: Secondary | ICD-10-CM | POA: Diagnosis not present

## 2015-03-10 DIAGNOSIS — Z79899 Other long term (current) drug therapy: Secondary | ICD-10-CM

## 2015-03-10 DIAGNOSIS — I2510 Atherosclerotic heart disease of native coronary artery without angina pectoris: Secondary | ICD-10-CM | POA: Diagnosis present

## 2015-03-10 DIAGNOSIS — I472 Ventricular tachycardia: Secondary | ICD-10-CM | POA: Diagnosis not present

## 2015-03-10 DIAGNOSIS — F319 Bipolar disorder, unspecified: Secondary | ICD-10-CM | POA: Diagnosis present

## 2015-03-10 DIAGNOSIS — I2119 ST elevation (STEMI) myocardial infarction involving other coronary artery of inferior wall: Secondary | ICD-10-CM | POA: Diagnosis present

## 2015-03-10 DIAGNOSIS — I480 Paroxysmal atrial fibrillation: Secondary | ICD-10-CM | POA: Diagnosis present

## 2015-03-10 DIAGNOSIS — T82857A Stenosis of cardiac prosthetic devices, implants and grafts, initial encounter: Secondary | ICD-10-CM | POA: Diagnosis present

## 2015-03-10 DIAGNOSIS — Z720 Tobacco use: Secondary | ICD-10-CM | POA: Diagnosis present

## 2015-03-10 HISTORY — DX: Paroxysmal atrial fibrillation: I48.0

## 2015-03-10 HISTORY — PX: CARDIAC CATHETERIZATION: SHX172

## 2015-03-10 HISTORY — DX: Depression, unspecified: F32.A

## 2015-03-10 HISTORY — DX: Major depressive disorder, single episode, unspecified: F32.9

## 2015-03-10 LAB — POCT I-STAT, CHEM 8
BUN: 15 mg/dL (ref 6–20)
CALCIUM ION: 1.08 mmol/L — AB (ref 1.13–1.30)
Chloride: 94 mmol/L — ABNORMAL LOW (ref 101–111)
Creatinine, Ser: 0.7 mg/dL (ref 0.61–1.24)
Glucose, Bld: 111 mg/dL — ABNORMAL HIGH (ref 65–99)
HEMATOCRIT: 41 % (ref 39.0–52.0)
HEMOGLOBIN: 13.9 g/dL (ref 13.0–17.0)
POTASSIUM: 3.1 mmol/L — AB (ref 3.5–5.1)
Sodium: 122 mmol/L — ABNORMAL LOW (ref 135–145)
TCO2: 17 mmol/L (ref 0–100)

## 2015-03-10 LAB — COMPREHENSIVE METABOLIC PANEL
ALBUMIN: 3.4 g/dL — AB (ref 3.5–5.0)
ALT: 23 U/L (ref 17–63)
AST: 72 U/L — AB (ref 15–41)
Alkaline Phosphatase: 53 U/L (ref 38–126)
Anion gap: 7 (ref 5–15)
BILIRUBIN TOTAL: 0.9 mg/dL (ref 0.3–1.2)
BUN: 12 mg/dL (ref 6–20)
CO2: 21 mmol/L — AB (ref 22–32)
Calcium: 8.1 mg/dL — ABNORMAL LOW (ref 8.9–10.3)
Chloride: 104 mmol/L (ref 101–111)
Creatinine, Ser: 0.91 mg/dL (ref 0.61–1.24)
GFR calc Af Amer: >60 mL/min (ref >60)
Glucose, Bld: 120 mg/dL — ABNORMAL HIGH (ref 65–99)
Potassium: 4 mmol/L (ref 3.5–5.1)
Sodium: 132 mmol/L — ABNORMAL LOW (ref 135–145)
Total Protein: 5.6 g/dL — ABNORMAL LOW (ref 6.5–8.1)

## 2015-03-10 LAB — POCT ACTIVATED CLOTTING TIME
Activated Clotting Time: 128 seconds
Activated Clotting Time: 362 seconds

## 2015-03-10 LAB — TROPONIN I
TROPONIN I: 50.9 ng/mL — AB (ref <0.031)
Troponin I: 8.64 ng/mL (ref <0.031)

## 2015-03-10 LAB — MRSA PCR SCREENING: MRSA by PCR: NEGATIVE

## 2015-03-10 SURGERY — LEFT HEART CATH AND CORONARY ANGIOGRAPHY
Anesthesia: LOCAL

## 2015-03-10 MED ORDER — ATORVASTATIN CALCIUM 80 MG PO TABS
80.0000 mg | ORAL_TABLET | Freq: Every day | ORAL | Status: DC
  Administered 2015-03-10 – 2015-03-11 (×2): 80 mg via ORAL
  Filled 2015-03-10 (×3): qty 1

## 2015-03-10 MED ORDER — TICAGRELOR 90 MG PO TABS
ORAL_TABLET | ORAL | Status: DC | PRN
  Administered 2015-03-10: 180 mg via ORAL

## 2015-03-10 MED ORDER — SODIUM CHLORIDE 0.9 % IJ SOLN
3.0000 mL | Freq: Two times a day (BID) | INTRAMUSCULAR | Status: DC
  Administered 2015-03-10: 3 mL via INTRAVENOUS

## 2015-03-10 MED ORDER — QUETIAPINE FUMARATE ER 300 MG PO TB24
300.0000 mg | ORAL_TABLET | Freq: Every day | ORAL | Status: DC
  Filled 2015-03-10: qty 1

## 2015-03-10 MED ORDER — SODIUM CHLORIDE 0.9 % IV SOLN
250.0000 mg | INTRAVENOUS | Status: DC | PRN
  Administered 2015-03-10: 1.75 mg/kg/h via INTRAVENOUS

## 2015-03-10 MED ORDER — ONDANSETRON HCL 4 MG/2ML IJ SOLN: 4.0000 mg | Freq: Four times a day (QID) | INTRAMUSCULAR | Status: DC | PRN

## 2015-03-10 MED ORDER — SODIUM CHLORIDE 0.9 % IV SOLN
1.7500 mg/kg/h | INTRAVENOUS | Status: AC
  Administered 2015-03-10: 1.75 mg/kg/h via INTRAVENOUS
  Filled 2015-03-10: qty 250

## 2015-03-10 MED ORDER — SODIUM CHLORIDE 0.9 % IV SOLN
1.7500 mg/kg/h | INTRAVENOUS | Status: AC
  Administered 2015-03-10: 1.75 mg/kg/h via INTRAVENOUS
  Filled 2015-03-10 (×2): qty 250

## 2015-03-10 MED ORDER — SODIUM CHLORIDE 0.9 % IV SOLN
0.2500 mg/kg/h | INTRAVENOUS | Status: DC
  Filled 2015-03-10: qty 250

## 2015-03-10 MED ORDER — SODIUM CHLORIDE 0.9 % IV SOLN
250.0000 mL | INTRAVENOUS | Status: DC | PRN
Start: 2015-03-10 — End: 2015-03-12

## 2015-03-10 MED ORDER — ATROPINE SULFATE 0.1 MG/ML IJ SOLN
INTRAMUSCULAR | Status: AC
  Filled 2015-03-10: qty 10

## 2015-03-10 MED ORDER — SODIUM CHLORIDE 0.9 % IV SOLN
INTRAVENOUS | Status: DC | PRN
  Administered 2015-03-10: 66 mL/h
  Administered 2015-03-10: 66 mL/h via INTRAVENOUS
  Administered 2015-03-10: 700 mL

## 2015-03-10 MED ORDER — SODIUM CHLORIDE 0.9 % IJ SOLN: 3.0000 mL | Freq: Two times a day (BID) | INTRAMUSCULAR | Status: DC

## 2015-03-10 MED ORDER — DIAZEPAM 5 MG PO TABS: 5.0000 mg | ORAL_TABLET | ORAL | Status: DC | PRN

## 2015-03-10 MED ORDER — LIDOCAINE-EPINEPHRINE 1 %-1:100000 IJ SOLN
10.0000 mL | Freq: Once | INTRAMUSCULAR | Status: DC
  Filled 2015-03-10: qty 10

## 2015-03-10 MED ORDER — SODIUM CHLORIDE 0.9 % IV SOLN: 250.0000 mL | INTRAVENOUS | Status: DC | PRN

## 2015-03-10 MED ORDER — NITROGLYCERIN 1 MG/10 ML FOR IR/CATH LAB
INTRA_ARTERIAL | Status: DC | PRN
  Administered 2015-03-10: 100 ug via INTRACORONARY

## 2015-03-10 MED ORDER — SODIUM CHLORIDE 0.9 % IJ SOLN: 3.0000 mL | INTRAMUSCULAR | Status: DC | PRN

## 2015-03-10 MED ORDER — SODIUM CHLORIDE 0.9 % IV SOLN
INTRAVENOUS | Status: DC
  Administered 2015-03-11: 05:00:00 via INTRAVENOUS

## 2015-03-10 MED ORDER — LIDOCAINE HCL (PF) 1 % IJ SOLN
INTRAMUSCULAR | Status: DC | PRN
  Administered 2015-03-10: 14:00:00

## 2015-03-10 MED ORDER — MIDAZOLAM HCL 2 MG/2ML IJ SOLN
INTRAMUSCULAR | Status: DC | PRN
  Administered 2015-03-10: 1 mg via INTRAVENOUS

## 2015-03-10 MED ORDER — FENTANYL CITRATE (PF) 100 MCG/2ML IJ SOLN
INTRAMUSCULAR | Status: DC | PRN
  Administered 2015-03-10: 25 ug via INTRAVENOUS

## 2015-03-10 MED ORDER — ACETAMINOPHEN 325 MG PO TABS: 650.0000 mg | ORAL_TABLET | ORAL | Status: DC | PRN

## 2015-03-10 MED ORDER — TICAGRELOR 90 MG PO TABS
90.0000 mg | ORAL_TABLET | Freq: Two times a day (BID) | ORAL | Status: DC
  Administered 2015-03-10 – 2015-03-12 (×4): 90 mg via ORAL
  Filled 2015-03-10 (×5): qty 1

## 2015-03-10 MED ORDER — DOPAMINE-DEXTROSE 3.2-5 MG/ML-% IV SOLN
INTRAVENOUS | Status: DC | PRN
  Administered 2015-03-10: 5 ug/kg/min via INTRAVENOUS

## 2015-03-10 MED ORDER — DULOXETINE HCL 60 MG PO CPEP
120.0000 mg | ORAL_CAPSULE | Freq: Every day | ORAL | Status: DC
  Administered 2015-03-11 – 2015-03-12 (×2): 120 mg via ORAL
  Filled 2015-03-10 (×3): qty 2

## 2015-03-10 MED ORDER — ASPIRIN EC 81 MG PO TBEC
81.0000 mg | DELAYED_RELEASE_TABLET | Freq: Every day | ORAL | Status: DC
  Administered 2015-03-10 – 2015-03-12 (×3): 81 mg via ORAL
  Filled 2015-03-10 (×3): qty 1

## 2015-03-10 MED ORDER — MORPHINE SULFATE 2 MG/ML IJ SOLN
2.0000 mg | INTRAMUSCULAR | Status: DC | PRN
  Filled 2015-03-10: qty 1

## 2015-03-10 MED ORDER — BIVALIRUDIN BOLUS VIA INFUSION - CUPID
INTRAVENOUS | Status: DC | PRN
  Administered 2015-03-10: 49.5 mg via INTRAVENOUS

## 2015-03-10 SURGICAL SUPPLY — 25 items
BALLN EUPHORA RX 2.25X12 (BALLOONS) ×4
BALLN EUPHORA RX 3.0X20 (BALLOONS) ×4
BALLN ~~LOC~~ EUPHORA RX 4.0X15 (BALLOONS) ×4
BALLN ~~LOC~~ EUPHORA RX 4.5X20 (BALLOONS) ×4
BALLOON EUPHORA RX 2.25X12 (BALLOONS) ×2 IMPLANT
BALLOON EUPHORA RX 3.0X20 (BALLOONS) ×2 IMPLANT
BALLOON ~~LOC~~ EUPHORA RX 4.0X15 (BALLOONS) ×2 IMPLANT
BALLOON ~~LOC~~ EUPHORA RX 4.5X20 (BALLOONS) ×2 IMPLANT
CATH INFINITI 5FR MULTPACK ANG (CATHETERS) ×4 IMPLANT
CATH VISTA GUIDE 6FR H STICK (CATHETERS) ×4 IMPLANT
CATH VISTA GUIDE 6FR JR4 (CATHETERS) ×4 IMPLANT
GLIDESHEATH SLEND A-KIT 6F 22G (SHEATH) ×4 IMPLANT
KIT ENCORE 26 ADVANTAGE (KITS) ×4 IMPLANT
KIT HEART LEFT (KITS) ×4 IMPLANT
PACK CARDIAC CATHETERIZATION (CUSTOM PROCEDURE TRAY) ×4 IMPLANT
SHEATH PINNACLE 5F 10CM (SHEATH) IMPLANT
SHEATH PINNACLE 6F 10CM (SHEATH) ×4 IMPLANT
STENT RESOLUTE INTEG 4.0X38 (Permanent Stent) ×4 IMPLANT
SYR MEDRAD MARK V 150ML (SYRINGE) ×4 IMPLANT
TRANSDUCER W/STOPCOCK (MISCELLANEOUS) ×4 IMPLANT
TUBING CIL FLEX 10 FLL-RA (TUBING) ×4 IMPLANT
WIRE ASAHI MEDIUM 180CM (WIRE) ×4 IMPLANT
WIRE COUGAR XT STRL 190CM (WIRE) ×4 IMPLANT
WIRE EMERALD 3MM-J .035X150CM (WIRE) ×4 IMPLANT
WIRE PT2 MS 185 (WIRE) ×4 IMPLANT

## 2015-03-10 NOTE — Progress Notes (Addendum)
R femoral sheath pulled without any complication w/ 2+ pre-post pedal pulses w/ manual pressure X 15 min tolerated well w/ bp in the 90s.R groin level 1  prior then level 0 afterwards.

## 2015-03-10 NOTE — H&P (Signed)
Patient ID: TREON KEHL MRN: [id number], DOB/AGE: 04/13/1956   Admit date: 03/10/2015   Primary Physician: Lucilla Edin, MD Primary Cardiologist: P. Swaziland, MD   Pt. Profile:  59 y/o male with h/o inferior MI and PCI of the RCA who presented today with inferior STEMI in the setting of RCA ISR.  Problem List  Past Medical History  Diagnosis Date  . Bipolar 1 disorder   . Tobacco abuse   . PAF (paroxysmal atrial fibrillation)     a. noted on admission 02/2015 in setting of inf stemi;  b. CHA2DS2VASc = 1.  Marland Kitchen CAD (coronary artery disease)     a. STEMI 09/2012  single vessel occlusive CAD of the RCA s/p DES to mid RCA, EF 45%;  b.   . LV dysfunction     a. 09/2012 Cath LV gram severe basal to midinferior wall hypokinesis, EF 45% and echo EF 55%.  Marland Kitchen Hyperlipidemia     Past Surgical History  Procedure Laterality Date  . Left heart catheterization with coronary angiogram N/A 09/27/2012    Procedure: LEFT HEART CATHETERIZATION WITH CORONARY ANGIOGRAM;  Surgeon: [name], MD;  Location: [facility] CATH LAB;  Service: Cardiovascular;  Laterality: N/A;    Allergies  No Known Allergies  HPI  59 y/o male with a prior h/o CAD s/p inferior MI in 09/2012 with DES to the mid RCA.  At the time, EF was 45% but subsequently normalized.  He was continued on DAPT until 11/2013, when effient was discontinued.  He continued to do well over the past year until 7/25, when he developed mild chest pressure assoc with diaphoresis while @ work.  Ss lasted about 5 mins and resolved spontaneously.  This AM, he was at work and developed sudden onset of marked diaphoresis associated with mild upper sternal pressure, nausea, and lightheadedness.  He was eventually taken up to the occupational health nurse and then EMS was called - he estimates that this was about 45 mins after onset of Ss.  Initial ECG showed marked inferior and lateral ST elevation with antlat reciprocal changes.  A Code STEMI was activated and  he was taken to the [facility] Cath Lab, where he was noted to have an occlusion of the RCA with involvement of the previously placed stent.  This was successfully treated with DES.  He did have hypotension during the procedure req dopamine, but this has since been dc's.  He is currently pain free.  Home Medications  Prior to Admission medications   Medication Sig Start Date End Date Taking? Authorizing Provider  aspirin EC 81 MG EC tablet Take 1 tablet (81 mg total) by mouth daily. 10/02/12   Jessica A Hope, PA-C  atorvastatin (LIPITOR) 80 MG tablet Take 1 tablet (80 mg total) by mouth at bedtime. 09/03/14   [name], MD  metoprolol tartrate (LOPRESSOR) 25 MG tablet Take 0.5 tablets (12.5 mg total) by mouth 2 (two) times daily. 12/11/13   [name], MD  nitroGLYCERIN (NITROSTAT) 0.4 MG SL tablet Place 1 tablet (0.4 mg total) under the tongue every 5 (five) minutes as needed for chest pain (up to 3 doses). 10/02/12   Jessica A Hope, PA-C  QUEtiapine (SEROQUEL XR) 300 MG 24 hr tablet Take 300 mg by mouth at bedtime.    Historical Provider, MD  sildenafil (VIAGRA) 50 MG tablet Take 1 tablet (50 mg total) by mouth daily as needed for erectile dysfunction. 12/26/13   [name],  MD   Family History  Family History  Problem Relation Age of Onset  . CAD Father     deceased.   Social History  History   Social History  . Marital Status: Married    Spouse Name: N/A  . Number of Children: N/A  . Years of Education: N/A   Occupational History  . Not on file.   Social History Main Topics  . Smoking status: Current Every Day Smoker -- 1.00 packs/day for 40 years    Types: Cigarettes  . Smokeless tobacco: Not on file  . Alcohol Use: No  . Drug Use: No  . Sexual Activity: Yes   Other Topics Concern  . Not on file   Social History Narrative   Lives in [place] by himself.  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  He does not routinely exercise.    Review of Systems General:  No chills, fever, night  sweats or weight changes.  Cardiovascular:  +++ chest pain, +++ dyspnea on exertion, +++ LH, +++ diaphoresis, no edema, orthopnea, palpitations, paroxysmal nocturnal dyspnea. Dermatological: No rash, lesions/masses Respiratory: No cough, dyspnea Urologic: No hematuria, dysuria Abdominal:   +++ nausea, no vomiting, diarrhea, bright red blood per rectum, melena, or hematemesis Neurologic:  No visual changes, wkns, changes in mental status. All other systems reviewed and are otherwise negative except as noted above.  Physical Exam  Blood pressure 119/82, pulse 0, resp. rate 0, height 5' 6.93" (1.7 m), weight 146 lb 13.2 oz (66.6 kg), SpO2 100 %.  General: Pleasant, NAD Psych: Normal affect. Neuro: Alert and oriented X 3. Moves all extremities spontaneously. HEENT: Normal  Neck: Supple without bruits or JVD. Lungs:  Resp regular and unlabored, CTA. Heart: RRR no s3, s4, or murmurs. Abdomen: Soft, non-tender, non-distended, BS + x 4.  Extremities: No clubbing, cyanosis or edema. DP/PT/Radials 2+ and equal bilaterally.  R groin cath site is w/o bleeding /bruit/hematoma.  Labs  Pending  Radiology/Studies  No results found.  ECG  Afib, 72, 4-5 mm inferior st elevation, 3 mm ST elev V5-6, antlat ST dep.  ASSESSMENT AND PLAN  1. Acute inferior STEMI/CAD:  S/p cath and successful PCI/DES to the RCA secondary to ISR.  Admit to ICU.  Cont asa, brilinta, high potency statin.  No BB in the short term secondary to hypotension during procedure (was on lopressor @ home).  Eventual cardiac rehab.  Check echo.  2.  PAF:  Pt presented in afib, now in sinus.  Per hx, he has a h/o irregular heart beat.  I don't see that we've documented afib before but he says that his PCP told him that he had afib several yrs ago.  CHA2DS2VASc = 1.  Follow tele.  Currently on DAPT.  Consider outpt event monitor to document burden of afib.  3.  Tob Abuse:  Complete cessation advised.  4.  HL:  Check lipids/lft's.   Cont high potency statin.  5.  Bipolar D/O:  Cont home meds.   Signed, Nicolasa Ducking, NP 03/10/2015, 2:36 PM  Patient seen and examined. Agree with assessment and plan.  Mr. Bluford Kaufmann is a 59 year old male who underwent initial stenting to his RCA in the setting of an inferior MI in February 2014.  He had been on Effient for over a year but discontinued this in April 2015.  [place] onset of severe chest pain and diaphoresis.  EMS arrived and he was found to have 5 mm  of ST segment elevation in inferiorly and inferolaterally with marked precordial ST segment depression and a code STEMI was activated.  An ECG initially also demonstrated atrial fibrillation.  Upon arrival to the catheterization laboratory he was still experiencing significant chest pain, and repeat ECG by EMS confirmed high risk acute inferior injury and emergent catheterization was performed.  Lennette Bihari, MD, [facility] 03/10/2015 6:47 PM

## 2015-03-10 NOTE — Care Management Note (Signed)
Case Management Note  Patient Details  Name: HAZEL WRINKLE MRN: [id number] Date of Birth: 1956/01/11  Subjective/Objective:     Adm w stemi               Action/Plan: lives w wife, pcp dt steven daub   Expected Discharge Date:                  Expected Discharge Plan:  Home/Self Care  In-House Referral:     Discharge planning Services  CM Consult, Medication Assistance  Post Acute Care Choice:    Choice offered to:     DME Arranged:    DME Agency:     HH Arranged:    HH Agency:     Status of Service:     Medicare Important Message Given:    Date Medicare IM Given:    Medicare IM give by:    Date Additional Medicare IM Given:    Additional Medicare Important Message give by:     If discussed at Long Length of Stay Meetings, dates discussed:    Additional Comments: gave pt 30day free and copay assist card for brilinta.  Hanley Hays, RN 03/10/2015, 3:17 PM

## 2015-03-10 NOTE — Progress Notes (Signed)
Ward Givens NP notified of pt's right groin with continuous slow oozing. Orders received

## 2015-03-10 NOTE — Progress Notes (Signed)
CRITICAL VALUE ALERT  Critical value received:  Trop 8.64   Date of notification:  03/10/15  Time of notification:  1530  Critical value read back:yes  Nurse who received alert:  P.Mikle Bosworth RN  MD notified (1st page):  Expected value  Time of first page:  N/A  MD notified (2nd page):  Time of second page:  Responding MD:  N/A  Time MD responded:  N/A

## 2015-03-11 ENCOUNTER — Encounter (HOSPITAL_COMMUNITY): Payer: Self-pay | Admitting: Cardiovascular Disease

## 2015-03-11 ENCOUNTER — Inpatient Hospital Stay (INDEPENDENT_AMBULATORY_CARE_PROVIDER_SITE_OTHER): Payer: BLUE CROSS/BLUE SHIELD

## 2015-03-11 DIAGNOSIS — R079 Chest pain, unspecified: Secondary | ICD-10-CM

## 2015-03-11 DIAGNOSIS — I2119 ST elevation (STEMI) myocardial infarction involving other coronary artery of inferior wall: Principal | ICD-10-CM

## 2015-03-11 LAB — CBC
HCT: 35.6 % — ABNORMAL LOW (ref 39.0–52.0)
Hemoglobin: 11.9 g/dL — ABNORMAL LOW (ref 13.0–17.0)
MCH: 27.4 pg (ref 26.0–34.0)
MCHC: 33.4 g/dL (ref 30.0–36.0)
MCV: 82 fL (ref 78.0–100.0)
Platelets: 135 10*3/uL — ABNORMAL LOW (ref 150–400)
RBC: 4.34 MIL/uL (ref 4.22–5.81)
RDW: 14 % (ref 11.5–15.5)
WBC: 7.8 10*3/uL (ref 4.0–10.5)

## 2015-03-11 LAB — LIPID PANEL
Cholesterol: 127 mg/dL (ref 0–200)
HDL: 35 mg/dL — AB (ref >40)
LDL CALC: 75 mg/dL (ref 0–99)
TRIGLYCERIDES: 84 mg/dL (ref <150)
Total CHOL/HDL Ratio: 3.6 RATIO
VLDL: 17 mg/dL (ref 0–40)

## 2015-03-11 LAB — BASIC METABOLIC PANEL
ANION GAP: 6 (ref 5–15)
BUN: 12 mg/dL (ref 6–20)
CO2: 24 mmol/L (ref 22–32)
Calcium: 8.1 mg/dL — ABNORMAL LOW (ref 8.9–10.3)
Chloride: 106 mmol/L (ref 101–111)
Creatinine, Ser: 1.01 mg/dL (ref 0.61–1.24)
GFR calc Af Amer: >60 mL/min (ref >60)
GLUCOSE: 119 mg/dL — AB (ref 65–99)
Potassium: 4 mmol/L (ref 3.5–5.1)
Sodium: 136 mmol/L (ref 135–145)

## 2015-03-11 LAB — TROPONIN I: TROPONIN I: 45.86 ng/mL — AB (ref <0.031)

## 2015-03-11 NOTE — Progress Notes (Signed)
SUBJECTIVE:  Patient is feeling better today. Last evening he had acute ST elevation inferior MI. He had total occlusion of the right coronary artery proximal to his prior stent. The procedure was difficult but successful. He was reperfused well. He had some oozing from the cath site in his groin. Ultimately that stabilized. During the night his monitor showed 5 beats of ventricular tachycardia. This morning he is fatigued but stable.   Filed Vitals:   03/11/15 0500 03/11/15 0600 03/11/15 0700 03/11/15 0723  BP:  87/59  91/59  Pulse: 70 66 64 67  Temp:    98.2 F (36.8 C)  TempSrc:    Oral  Resp: 22 18 20 12   Height:      Weight:      SpO2: 95% 96% 97% 98%     Intake/Output Summary (Last 24 hours) at 03/11/15 0938 Last data filed at 03/11/15 0800  Gross per 24 hour  Intake 2467.5 ml  Output   1150 ml  Net 1317.5 ml    LABS: Basic Metabolic Panel:  Recent Labs  16/10/96 1530 03/11/15 0236  NA 132* 136  K 4.0 4.0  CL 104 106  CO2 21* 24  GLUCOSE 120* 119*  BUN 12 12  CREATININE 0.91 1.01  CALCIUM 8.1* 8.1*   Liver Function Tests:  Recent Labs  03/10/15 1530  AST 72*  ALT 23  ALKPHOS 53  BILITOT 0.9  PROT 5.6*  ALBUMIN 3.4*   No results for input(s): LIPASE, AMYLASE in the last 72 hours. CBC:  Recent Labs  03/10/15 1257 03/11/15 0236  WBC  --  7.8  HGB 13.9 11.9*  HCT 41.0 35.6*  MCV  --  82.0  PLT  --  135*   Cardiac Enzymes:  Recent Labs  03/10/15 1530 03/10/15 2146 03/11/15 0236  TROPONINI 8.64* 50.90* 45.86*   BNP: Invalid input(s): POCBNP D-Dimer: No results for input(s): DDIMER in the last 72 hours. Hemoglobin A1C: No results for input(s): HGBA1C in the last 72 hours. Fasting Lipid Panel:  Recent Labs  03/11/15 0236  CHOL 127  HDL 35*  LDLCALC 75  TRIG 84  CHOLHDL 3.6   Thyroid Function Tests: No results for input(s): TSH, T4TOTAL, T3FREE, THYROIDAB in the last 72 hours.  Invalid input(s): FREET3  RADIOLOGY: No  results found.  PHYSICAL EXAM  patient is oriented to person time and place. Affect is normal. Lungs are clear. Respiratory effort is not labored. Cardiac exam reveals S1 and S2. His cath site in the groin is nicely healed. There is no hematoma. There is no peripheral edema.   TELEMETRY:  I have reviewed telemetry today March 11, 2015. There is a 5 beat run of ventricular tachycardia. This is a reperfusion arrhythmia. No change in therapy.   ASSESSMENT AND PLAN:     Acute ST elevation myocardial infarction (STEMI) involving other coronary artery of inferior wall     The patient is recovering. He had 5 beats of ventricular tachycardia needs to be monitored further. He has a very large right coronary artery that was reperfused. He will be ambulated as the day goes on today. I will order cardiac rehabilitation.    Bipolar 1 disorder   Hyperlipidemia   Tobacco abuse       PAF (paroxysmal atrial fibrillation)     Patient had brief atrial fibrillation upon presentation with his acute MI. He then reverted to sinus rhythm. Historically there is no proven atrial fibrillation. He will not be  anticoagulated on this basis. An event recorder may be appropriate to screen further in the future for atrial fibrillation.  Willa Rough 03/11/2015 9:38 AM

## 2015-03-11 NOTE — Progress Notes (Signed)
  Echocardiogram 2D Echocardiogram has been performed.  Bob Eastwood 03/11/2015, 10:18 AM

## 2015-03-11 NOTE — Progress Notes (Signed)
CARDIAC REHAB PHASE I   PRE:  Rate/Rhythm: 75 SR    BP: sitting 101/71    SaO2:   MODE:  Ambulation: 450 ft   POST:  Rate/Rhythm: 77 SR    BP: sitting 107/77     SaO2:   Tolerated well, no c/o. Pt asking exactly where his stent is "nobody is telling me anything". [name] in, I will f/u in am for ed. [id number]   Elissa Lovett Morral CES, ACSM 03/11/2015 3:23 PM

## 2015-03-12 ENCOUNTER — Encounter (HOSPITAL_COMMUNITY): Payer: Self-pay | Admitting: Physician Assistant

## 2015-03-12 MED ORDER — TICAGRELOR 90 MG PO TABS: 90.0000 mg | ORAL_TABLET | Freq: Two times a day (BID) | ORAL | Status: DC

## 2015-03-12 NOTE — Discharge Instructions (Signed)
No driving for 24 hours. No lifting over 5 lbs for 1 week. No sexual activity for 1 week. You may return to work after 1 week or when cleared by Dr. Swaziland. Keep procedure site clean & dry. If you notice increased pain, swelling, bleeding or pus, call/return!  You may shower, but no soaking baths/hot tubs/pools for 1 week.

## 2015-03-12 NOTE — Progress Notes (Signed)
Patient Name: Jason Maldonado Date of Encounter: 03/12/2015  Primary Cardiologist: Dr. Swaziland   Principal Problem:   Acute ST elevation myocardial infarction (STEMI) involving other coronary artery of inferior wall Active Problems:   Bipolar 1 disorder   Hyperlipidemia   Tobacco abuse   CAD (coronary artery disease)   PAF (paroxysmal atrial fibrillation)   ST elevation myocardial infarction (STEMI) involving right coronary artery in recovery phase   Acute MI, inferior wall    SUBJECTIVE  Denies any CP or SOB.   CURRENT MEDS . aspirin EC  81 mg Oral Daily  . atorvastatin  80 mg Oral QHS  . DULoxetine  120 mg Oral Daily  . sodium chloride  3 mL Intravenous Q12H  . ticagrelor  90 mg Oral BID    OBJECTIVE  Filed Vitals:   03/11/15 2050 03/11/15 2053 03/12/15 0549 03/12/15 0756  BP: 87/50 102/64 107/69 105/69  Pulse: 66  65 65  Temp: 98.5 F (36.9 C)  98.5 F (36.9 C) 98.6 F (37 C)  TempSrc: Oral  Oral Oral  Resp: Height:      Weight:   152 lb 6.4 oz (69.128 kg)   SpO2: 99%  98% 98%    Intake/Output Summary (Last 24 hours) at 03/12/15 1104 Last data filed at 03/12/15 0549  Gross per 24 hour  Intake    510 ml  Output   1050 ml  Net   -540 ml   Filed Weights   03/10/15 1500 03/11/15 0219 03/12/15 0549  Weight: 150 lb 9.2 oz (68.3 kg) 153 lb 3.5 oz (69.5 kg) 152 lb 6.4 oz (69.128 kg)    PHYSICAL EXAM  General: Pleasant, NAD. Neuro: Alert and oriented X 3. Moves all extremities spontaneously. Psych: Normal affect. HEENT:  Normal  Neck: Supple without bruits or JVD. Lungs:  Resp regular and unlabored, CTA. Heart: RRR no s3, s4, or murmurs. Abdomen: Soft, non-tender, non-distended, BS + x 4.  R groin cath site stable, without significant bleeding or hematoma Extremities: No clubbing, cyanosis or edema. DP/PT/Radials 2+ and equal bilaterally.  Accessory Clinical Findings  CBC  Recent Labs  03/10/15 1257 03/11/15 0236  WBC  --  7.8  HGB  13.9 11.9*  HCT 41.0 35.6*  MCV  --  82.0  PLT  --  135*   Basic Metabolic Panel  Recent Labs  03/10/15 1530 03/11/15 0236  NA 132* 136  K 4.0 4.0  CL 104 106  CO2 21* 24  GLUCOSE 120* 119*  BUN 12 12  CREATININE 0.91 1.01  CALCIUM 8.1* 8.1*   Liver Function Tests  Recent Labs  03/10/15 1530  AST 72*  ALT 23  ALKPHOS 53  BILITOT 0.9  PROT 5.6*  ALBUMIN 3.4*   Cardiac Enzymes  Recent Labs  03/10/15 1530 03/10/15 2146 03/11/15 0236  TROPONINI 8.64* 50.90* 45.86*   Fasting Lipid Panel  Recent Labs  03/11/15 0236  CHOL 127  HDL 35*  LDLCALC 75  TRIG 84  CHOLHDL 3.6   TELE NSR with occasional PVCs, longest 3 beats    ECG  NSR with TWI in inferior lead  Echocardiogram 03/11/2015  LV EF: 30% -  35%  ------------------------------------------------------------------- Indications:   Chest pain 786.51.  ------------------------------------------------------------------- History:  PMH: Large high risk inferior STEMI.  ------------------------------------------------------------------- Study Conclusions  - Left ventricle: The cavity size was normal. Wall thickness was normal. Systolic function was moderately to severely reduced. The estimated ejection fraction  was in the range of 30% to 35%. Basal to mid inferior akinesis. Inferoseptal and inferolateral severe hypokinesis. Left ventricular diastolic function parameters were normal. - Aortic valve: There was no stenosis. - Mitral valve: There was no significant regurgitation. - Left atrium: The atrium was mildly dilated. - Right ventricle: The cavity size was normal. Systolic function was mildly reduced. - Right atrium: The atrium was mildly dilated. - Tricuspid valve: Peak RV-RA gradient (S): 20 mm Hg. - Pulmonary arteries: PA peak pressure: 28 mm Hg (S). - Systemic veins: IVC measured 2.0 cm with < 50% respirophasic variation, suggesting RA pressure 8  mmHg.  Impressions:  - Normal LV size with EF 30-35. Wall motion abnormalities as above, consistent with large inferior MI. The RV was normal in size with mildly decreased systolic function. No significant mitral regurgitation.    Radiology/Studies  No results found.  ASSESSMENT AND PLAN  1. Inferior STEMI  - cath 03/10/2015 100% stenosis of prox and mid RCA prox to a previously placed unknown stent, 4.038 mm Resolute DES stent postdilated to 4.41 mm proximally and 4.1 mm distally. 50% residual RPDA, 40% 1st RPLB, 40%distal RCA lesion.  - Echo EF 30-35%, basal to mid inferior akinesis, inferoseptal and inferolateral severe hypokinesis.   - stable for discharge. Continue ASA and Brilinta. Continue lipitor. He has baseline relative hypotension, and unable to add BB at this time.   2. Bipolar 1 disorder  3. PAF: brief run of PAF with acute MI, however reverted back to NSR, occurred in the setting of ischemia. No need for anticoagulation at this time unless has future recurrence  4. Hyperlipidemia  5. Tobacco abuse  Signed, Azalee Course PA-C Pager: [phone] Patient seen and examined. I agree with the assessment and plan as detailed above. See also my additional thoughts below.   The patient has done well and is ready to go home. I have made the decision for discharge.  Willa Rough, MD, [facility] 03/12/2015 12:32 PM

## 2015-03-12 NOTE — Discharge Summary (Signed)
Discharge Summary   Patient ID: Jason Maldonado,  MRN: [id number], DOB/AGE: Jul 11, 1956 59 y.o.  Admit date: 03/10/2015 Discharge date: 03/12/2015  Primary Care Provider: Earl Lites A Primary Cardiologist: Dr. Swaziland  Discharge Diagnoses Principal Problem:   Acute ST elevation myocardial infarction (STEMI) involving other coronary artery of inferior wall Active Problems:   Bipolar 1 disorder   Hyperlipidemia   Tobacco abuse   CAD (coronary artery disease)   PAF (paroxysmal atrial fibrillation)   ST elevation myocardial infarction (STEMI) involving right coronary artery in recovery phase   Acute MI, inferior wall   Allergies No Known Allergies  Procedures  Echocardiogram 03/11/2015 LV EF: 30% -  35%  ------------------------------------------------------------------- Indications:   Chest pain 786.51.  ------------------------------------------------------------------- History:  PMH: Large high risk inferior STEMI.  ------------------------------------------------------------------- Study Conclusions  - Left ventricle: The cavity size was normal. Wall thickness was normal. Systolic function was moderately to severely reduced. The estimated ejection fraction was in the range of 30% to 35%. Basal to mid inferior akinesis. Inferoseptal and inferolateral severe hypokinesis. Left ventricular diastolic function parameters were normal. - Aortic valve: There was no stenosis. - Mitral valve: There was no significant regurgitation. - Left atrium: The atrium was mildly dilated. - Right ventricle: The cavity size was normal. Systolic function was mildly reduced. - Right atrium: The atrium was mildly dilated. - Tricuspid valve: Peak RV-RA gradient (S): 20 mm Hg. - Pulmonary arteries: PA peak pressure: 28 mm Hg (S). - Systemic veins: IVC measured 2.0 cm with < 50% respirophasic variation, suggesting RA pressure 8 mmHg.  Impressions:  - Normal LV size with  EF 30-35. Wall motion abnormalities as above, consistent with large inferior MI. The RV was normal in size with mildly decreased systolic function. No significant mitral regurgitation.    Cardiac catheterization 03/10/2015 LV EF: 30% -  35%  ------------------------------------------------------------------- Indications:   Chest pain 786.51.  ------------------------------------------------------------------- History:  PMH: Large high risk inferior STEMI.  ------------------------------------------------------------------- Study Conclusions  - Left ventricle: The cavity size was normal. Wall thickness was normal. Systolic function was moderately to severely reduced. The estimated ejection fraction was in the range of 30% to 35%. Basal to mid inferior akinesis. Inferoseptal and inferolateral severe hypokinesis. Left ventricular diastolic function parameters were normal. - Aortic valve: There was no stenosis. - Mitral valve: There was no significant regurgitation. - Left atrium: The atrium was mildly dilated. - Right ventricle: The cavity size was normal. Systolic function was mildly reduced. - Right atrium: The atrium was mildly dilated. - Tricuspid valve: Peak RV-RA gradient (S): 20 mm Hg. - Pulmonary arteries: PA peak pressure: 28 mm Hg (S). - Systemic veins: IVC measured 2.0 cm with < 50% respirophasic variation, suggesting RA pressure 8 mmHg.  Impressions:  - Normal LV size with EF 30-35. Wall motion abnormalities as above, consistent with large inferior MI. The RV was normal in size with mildly decreased systolic function. No significant mitral regurgitation.    Hospital Course  Jason Maldonado is a 59 year old male with past medical history of hyperlipidemia, bipolar disorder, tobacco abuse, and a history of CAD status post previous inferior MI in 2014. He presented to [facility] on 03/10/2015 as a inferior STEMI in the setting of RCA  in-stent restenosis. In the morning of admission, he was at work and developed sudden onset of marked diaphoresis associated with mild upper sternal pressure, nausea and lightheadedness. He was eventually taken to the occupational health nurse and the EMS was called roughly 45 minutes  after onset of symptoms. Initial EKG showed marked inferior and lateral ST elevation with anterolateral reciprocal changes. A code STEMI was activated and he was taken directly to [facility] cath lab.  Cardiac catheterization on 03/10/2015 showed 100% stenosis of prox and mid RCA prox to a previously placed unknown stent, 4.038 mm Resolute DES stent postdilated to 4.41 mm proximally and 4.1 mm distally. 50% residual RPDA, 40% 1st RPLB, 40%distal RCA lesion. Post cath, he was placed on aspirin and Brilinta. Echo EF 30-35%, basal to mid inferior akinesis, inferoseptal and inferolateral severe hypokinesis. He did have some hypotension on 7/28, however this was gradually improved.  He was seen in the morning of 03/12/2015, at which time he was doing well without significant chest discomfort or shortness breath. His R groin cath site is stable. He has been seen by cardiac rehabilitation and is currently not interested in quit smoking. I have tried to contact his spouse multiple times on 7/28 and 7/29, however she did not pick up the phone. I have went over in detail with the patient his own coronary anatomy and where the stent was placed. All questions answered. His blood pressure now is in 100s, he is unable to tolerate addition of any blood pressure medication this time. I have discharged him on aspirin, Brilinta, and statin medication. Repeat emphasis has been placed on compliance with DAPT. I have given him a 30 day prescription of Brilinta with free coupon and sent to his pharmacy 1 year worth of Brilinta.   Discharge Vitals Blood pressure 105/69, pulse 65, temperature 98.6 F (37 C), temperature source Oral, resp. rate 18, height 5'  7" (1.702 m), weight 152 lb 6.4 oz (69.128 kg), SpO2 98 %.  Filed Weights   03/10/15 1500 03/11/15 0219 03/12/15 0549  Weight: 150 lb 9.2 oz (68.3 kg) 153 lb 3.5 oz (69.5 kg) 152 lb 6.4 oz (69.128 kg)    Labs  CBC  Recent Labs  03/10/15 1257 03/11/15 0236  WBC  --  7.8  HGB 13.9 11.9*  HCT 41.0 35.6*  MCV  --  82.0  PLT  --  135*   Basic Metabolic Panel  Recent Labs  03/10/15 1530 03/11/15 0236  NA 132* 136  K 4.0 4.0  CL 104 106  CO2 21* 24  GLUCOSE 120* 119*  BUN 12 12  CREATININE 0.91 1.01  CALCIUM 8.1* 8.1*   Liver Function Tests  Recent Labs  03/10/15 1530  AST 72*  ALT 23  ALKPHOS 53  BILITOT 0.9  PROT 5.6*  ALBUMIN 3.4*   Cardiac Enzymes  Recent Labs  03/10/15 1530 03/10/15 2146 03/11/15 0236  TROPONINI 8.64* 50.90* 45.86*   Fasting Lipid Panel  Recent Labs  03/11/15 0236  CHOL 127  HDL 35*  LDLCALC 75  TRIG 84  CHOLHDL 3.6    Disposition  Pt is being discharged home today in good condition.  Follow-up Plans & Appointments      Follow-up Information    Follow up with Norma Fredrickson, NP On 03/19/2015.   Specialties:  Nurse Practitioner, Interventional Cardiology, Cardiology, Radiology   Why:  9:30am   Contact information:   [address] [place] [address] [phone]       Discharge Medications    Medication List    TAKE these medications        aspirin 81 MG EC tablet  Take 1 tablet (81 mg total) by mouth daily.     atorvastatin 80 MG  tablet  Commonly known as:  LIPITOR  Take 1 tablet (80 mg total) by mouth at bedtime.     DULoxetine 60 MG capsule  Commonly known as:  CYMBALTA  Take 120 mg by mouth every morning.     nitroGLYCERIN 0.4 MG SL tablet  Commonly known as:  NITROSTAT  Place 1 tablet (0.4 mg total) under the tongue every 5 (five) minutes as needed for chest pain (up to 3 doses).     sildenafil 50 MG tablet  Commonly known as:  VIAGRA  Take 1 tablet (50 mg total) by mouth  daily as needed for erectile dysfunction.     ticagrelor 90 MG Tabs tablet  Commonly known as:  BRILINTA  Take 1 tablet (90 mg total) by mouth 2 (two) times daily.        Duration of Discharge Encounter   Greater than 30 minutes including physician time.  Ramond Dial PA-C Pager: [phone] 03/12/2015, 1:00 PM  Patient seen and examined. I agree with the assessment and plan as detailed above. See also my additional thoughts below.   Refer also to the progress note from today. The patient is stable. He is ready for discharge. I made the decision for discharge. I agree with the discharge note as outlined above in the plans  Willa Rough, MD, [facility] 03/12/2015 2:01 PM

## 2015-03-12 NOTE — Progress Notes (Signed)
Pt moving independently. No c/o. Ed completed, very brief as pt sts "I know that". Not interested in CRPII or quitting smoking. [id number] Ethelda Chick CES, ACSM 1:13 PM 03/12/2015

## 2015-03-15 ENCOUNTER — Telehealth: Payer: Self-pay | Admitting: Cardiovascular Disease

## 2015-03-15 ENCOUNTER — Telehealth: Payer: Self-pay | Admitting: Cardiology

## 2015-03-15 NOTE — Telephone Encounter (Signed)
TCM   08/05 @ 9:30a with Norma Fredrickson per [facility]

## 2015-03-15 NOTE — Telephone Encounter (Signed)
Patient contacted regarding discharge from [facility] on 03/12/2015.  Patient understands to follow up with provider Norma Fredrickson on 03/19/2015 at 0930 at [facility]. Patient understands discharge instructions? YES Patient understands medications and regiment? YES Patient understands to bring all medications to this visit? YES  No further assistance needed.

## 2015-03-15 NOTE — Telephone Encounter (Signed)
TCM call already addressed.

## 2015-03-15 NOTE — Telephone Encounter (Signed)
D/C phone call .Marland Kitchen Appt on 03/19/2015 at 9:30am w/ Norma Fredrickson   Thanks

## 2015-03-19 ENCOUNTER — Encounter: Payer: Self-pay | Admitting: Nurse Practitioner

## 2015-03-19 ENCOUNTER — Telehealth: Payer: Self-pay | Admitting: *Deleted

## 2015-03-19 ENCOUNTER — Ambulatory Visit (INDEPENDENT_AMBULATORY_CARE_PROVIDER_SITE_OTHER): Payer: BLUE CROSS/BLUE SHIELD | Admitting: Nurse Practitioner

## 2015-03-19 VITALS — BP 140/80 | HR 64 | Ht 67.0 in | Wt 148.4 lb

## 2015-03-19 DIAGNOSIS — R748 Abnormal levels of other serum enzymes: Secondary | ICD-10-CM | POA: Diagnosis not present

## 2015-03-19 DIAGNOSIS — E785 Hyperlipidemia, unspecified: Secondary | ICD-10-CM

## 2015-03-19 DIAGNOSIS — I255 Ischemic cardiomyopathy: Secondary | ICD-10-CM

## 2015-03-19 DIAGNOSIS — I2583 Coronary atherosclerosis due to lipid rich plaque: Secondary | ICD-10-CM

## 2015-03-19 DIAGNOSIS — I2510 Atherosclerotic heart disease of native coronary artery without angina pectoris: Secondary | ICD-10-CM

## 2015-03-19 DIAGNOSIS — Z720 Tobacco use: Secondary | ICD-10-CM | POA: Diagnosis not present

## 2015-03-19 DIAGNOSIS — I4891 Unspecified atrial fibrillation: Secondary | ICD-10-CM

## 2015-03-19 LAB — HEPATIC FUNCTION PANEL
ALT: 18 U/L (ref 0–53)
AST: 20 U/L (ref 0–37)
Albumin: 4 g/dL (ref 3.5–5.2)
Alkaline Phosphatase: 68 U/L (ref 39–117)
Bilirubin, Direct: 0.1 mg/dL (ref 0.0–0.3)
Total Bilirubin: 0.6 mg/dL (ref 0.2–1.2)
Total Protein: 6.7 g/dL (ref 6.0–8.3)

## 2015-03-19 LAB — CBC
HCT: 42.6 % (ref 39.0–52.0)
Hemoglobin: 14 g/dL (ref 13.0–17.0)
MCHC: 32.9 g/dL (ref 30.0–36.0)
MCV: 81.9 fl (ref 78.0–100.0)
Platelets: 207 10*3/uL (ref 150.0–400.0)
RBC: 5.2 Mil/uL (ref 4.22–5.81)
RDW: 14.6 % (ref 11.5–15.5)
WBC: 6.9 10*3/uL (ref 4.0–10.5)

## 2015-03-19 LAB — BASIC METABOLIC PANEL
BUN: 14 mg/dL (ref 6–23)
CO2: 30 mEq/L (ref 19–32)
Calcium: 9.5 mg/dL (ref 8.4–10.5)
Chloride: 102 mEq/L (ref 96–112)
Creatinine, Ser: 1.07 mg/dL (ref 0.40–1.50)
GFR: 74.63 mL/min (ref >60.00)
Glucose, Bld: 137 mg/dL — ABNORMAL HIGH (ref 70–99)
Potassium: 4.3 mEq/L (ref 3.5–5.1)
Sodium: 137 mEq/L (ref 135–145)

## 2015-03-19 MED ORDER — NITROGLYCERIN 0.4 MG SL SUBL: 0.4000 mg | SUBLINGUAL_TABLET | SUBLINGUAL | Status: DC | PRN

## 2015-03-19 MED ORDER — LISINOPRIL 5 MG PO TABS: 5.0000 mg | ORAL_TABLET | Freq: Every day | ORAL | Status: DC

## 2015-03-19 NOTE — Patient Instructions (Addendum)
We will be checking the following labs today - BMET, HPF and CBC   Medication Instructions:    Continue with your current medicines.   I have refilled your NTG today  I am placing you on Lisinopril 5 mg to take one a day - take your first dose tonight - and then one a day - this is to help with the pumping function of your heart - this is at the drug store    Testing/Procedures To Be Arranged:  N/A  Follow-Up:   See me on August 22nd  See Dr. Swaziland in 4 to 6 weeks.    Other Special Instructions:   Smoking cessation encouraged.   Call the [facility] office at [phone] if you have any questions, problems or concerns.

## 2015-03-19 NOTE — Telephone Encounter (Signed)
Patient called and wanted to know if there is something that he can take instead of the lisinopril. He states that his wife was on this and it caused dizziness and cough. Please advise. Thanks, MI

## 2015-03-19 NOTE — Telephone Encounter (Signed)
S/w pt is aware just because wife developed a cough on this medication doesn't mean pt will develop a cough.  Pt stated verbal understanding and agreed.  Will pick up medication today.  Pt was sent in a 90 day supply but only wants 30 quantity of lisinopril stated to tell pharmacy pt only want's 30.  Pt stated verbal understanding.

## 2015-03-19 NOTE — Progress Notes (Signed)
CARDIOLOGY OFFICE NOTE  Date:  03/19/2015    Jason Maldonado Date of Birth: April 20, 1956 Medical Record #[id number]  PCP:  Lucilla Edin, MD  Cardiologist:  Swaziland    Chief Complaint  Patient presents with  . Coronary Artery Disease    Post hospital visit - seen for Dr. Swaziland.     History of Present Illness: Jason Maldonado is a 59 y.o. male who presents today for a post hospital visit. Seen for Dr. Swaziland. I have seen him back in 2014. He has a past medical history of hyperlipidemia, bipolar disorder, tobacco abuse, and a history of CAD status post previous inferior MI in 2014 with PCI to the RCA.   He presented to [facility] on 03/10/2015 as a inferior STEMI in the setting of RCA in-stent restenosis.   Cardiac catheterization on 03/10/2015 showed 100% stenosis of prox and mid RCA prox to a previously placed unknown stent, 4.038 mm Resolute DES stent postdilated to 4.41 mm proximally and 4.1 mm distally. 50% residual RPDA, 40% 1st RPLB, 40%distal RCA lesion. Post cath, he was placed on aspirin and Brilinta. Echo EF 30-35%, basal to mid inferior akinesis, inferoseptal and inferolateral severe hypokinesis. He did have some hypotension on 7/28, however this was gradually improved. Discharged on Brilinta. Not interested in smoking cessation.   Comes in today. Here alone. Doing ok. Only home a week. He feels like he is doing well. Surprised about the EF. Says no one told him. No more chest pain. Not short of breath. Still with some cold hands. Says he does not have time to go to cardiac rehab. He is tapering down with his smoking - he says he recognizes the importance of stopping. He is considering using nicotine patches. He has stopped before using the patches. 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 Lots of stress with his job as well.   Past Medical History  Diagnosis Date  . Bipolar 1 disorder   . Tobacco abuse   . PAF  (paroxysmal atrial fibrillation)     a. noted on admission 02/2015 in setting of inf stemi;  b. CHA2DS2VASc = 1.  Marland Kitchen CAD (coronary artery disease)     a. STEMI 09/2012  single vessel occlusive CAD of the RCA s/p DES to mid RCA, EF 45%;  b. inferior STEMI 03/10/2015 DES to prox and mid RCA  . LV dysfunction     a. 09/2012 Cath LV gram severe basal to midinferior wall hypokinesis, EF 45% and echo EF 55%.  Marland Kitchen Hyperlipidemia   . Depression     Past Surgical History  Procedure Laterality Date  . Left heart catheterization with coronary angiogram N/A 09/27/2012    Procedure: LEFT HEART CATHETERIZATION WITH CORONARY ANGIOGRAM;  Surgeon: [name], MD;  Location: [facility] CATH LAB;  Service: Cardiovascular;  Laterality: N/A;  . Cardiac catheterization N/A 03/10/2015    Procedure: Left Heart Cath and Coronary Angiography;  Surgeon: Lennette Bihari, MD;  Location: [facility] INVASIVE CV LAB;  Service: Cardiovascular;  Laterality: N/A;  . Cardiac catheterization  03/10/2015    Procedure: Coronary Stent Intervention;  Surgeon: Lennette Bihari, MD;  Location: [facility] INVASIVE CV LAB;  Service: Cardiovascular;;     Medications: Current Outpatient Prescriptions  Medication Sig Dispense Refill  . aspirin EC 81 MG EC tablet Take 1 tablet (81 mg total) by mouth daily.    Marland Kitchen atorvastatin (LIPITOR) 80  MG tablet Take 1 tablet (80 mg total) by mouth at bedtime. 90 tablet 1  . DULoxetine (CYMBALTA) 60 MG capsule Take 120 mg by mouth daily. (120 mg ) two tablets in the am  2  . nitroGLYCERIN (NITROSTAT) 0.4 MG SL tablet Place 1 tablet (0.4 mg total) under the tongue every 5 (five) minutes as needed for chest pain (up to 3 doses). 25 tablet 3  . ticagrelor (BRILINTA) 90 MG TABS tablet Take 1 tablet (90 mg total) by mouth 2 (two) times daily. 180 tablet 3  . lisinopril (PRINIVIL,ZESTRIL) 5 MG tablet Take 1 tablet (5 mg total) by mouth daily. 90 tablet 3   No current facility-administered medications for this visit.    Allergies: No  Known Allergies  Social History: The patient  reports that he has been smoking Cigarettes.  He has a 40 pack-year smoking history. He does not have any smokeless tobacco history on file. He reports that he does not drink alcohol or use illicit drugs.   Family History: The patient's family history includes CAD in his father.   Review of Systems: Please see the history of present illness.   Otherwise, the review of systems is positive for none.   All other systems are reviewed and negative.   Physical Exam: VS:  BP 140/80 mmHg  Pulse 64  Ht 5\' 7"  (1.702 m)  Wt 148 lb 6.4 oz (67.314 kg)  BMI 23.24 kg/m2 .  BMI Body mass index is 23.24 kg/(m^2).  Wt Readings from Last 3 Encounters:  03/19/15 148 lb 6.4 oz (67.314 kg)  03/12/15 152 lb 6.4 oz (69.128 kg)  12/11/13 146 lb 12.8 oz (66.588 kg)    General: Pleasant. Well developed, well nourished and in no acute distress.  HEENT: Normal. Neck: Supple, no JVD, carotid bruits, or masses noted.  Cardiac: His rhythm is irregular - few ectopics noted. No murmurs, rubs, or gallops. No edema.  Respiratory:  Lungs are clear to auscultation bilaterally with normal work of breathing.  GI: Soft and nontender.  MS: No deformity or atrophy. Gait and ROM intact. Skin: Warm and dry. Color is sallow. Neuro:  Strength and sensation are intact and no gross focal deficits noted.  Psych: Alert, appropriate and with normal affect.   LABORATORY DATA:  EKG:  EKG is ordered today. This shows inferior MI with evolving changes.   Lab Results  Component Value Date   WBC 7.8 03/11/2015   HGB 11.9* 03/11/2015   HCT 35.6* 03/11/2015   PLT 135* 03/11/2015   GLUCOSE 119* 03/11/2015   CHOL 127 03/11/2015   TRIG 84 03/11/2015   HDL 35* 03/11/2015   LDLCALC 75 03/11/2015   ALT 23 03/10/2015   AST 72* 03/10/2015   NA 136 03/11/2015   K 4.0 03/11/2015   CL 106 03/11/2015   CREATININE 1.01 03/11/2015   BUN 12 03/11/2015   CO2 24 03/11/2015   TSH 0.698  09/27/2012   INR 1.18 09/27/2012   HGBA1C 5.7* 09/27/2012    Lab Results  Component Value Date   CKTOTAL 360* 09/27/2012   CKMB 23.2* 09/27/2012   TROPONINI 45.86* 03/11/2015     BNP (last 3 results) No results for input(s): BNP in the last 8760 hours.  ProBNP (last 3 results) No results for input(s): PROBNP in the last 8760 hours.   Other Studies Reviewed Today:  Echocardiogram 03/11/2015 LV EF: 30% -  35%  ------------------------------------------------------------------- Indications:   Chest pain 786.51.  ------------------------------------------------------------------- History:  PMH:  Large high risk inferior STEMI.  ------------------------------------------------------------------- Study Conclusions  - Left ventricle: The cavity size was normal. Wall thickness was normal. Systolic function was moderately to severely reduced. The estimated ejection fraction was in the range of 30% to 35%. Basal to mid inferior akinesis. Inferoseptal and inferolateral severe hypokinesis. Left ventricular diastolic function parameters were normal. - Aortic valve: There was no stenosis. - Mitral valve: There was no significant regurgitation. - Left atrium: The atrium was mildly dilated. - Right ventricle: The cavity size was normal. Systolic function was mildly reduced. - Right atrium: The atrium was mildly dilated. - Tricuspid valve: Peak RV-RA gradient (S): 20 mm Hg. - Pulmonary arteries: PA peak pressure: 28 mm Hg (S). - Systemic veins: IVC measured 2.0 cm with < 50% respirophasic variation, suggesting RA pressure 8 mmHg.  Impressions:  - Normal LV size with EF 30-35. Wall motion abnormalities as above, consistent with large inferior MI. The RV was normal in size with mildly decreased systolic function. No significant mitral regurgitation.    Cardiac catheterization 03/10/2015 LV EF: 30% -   35%  ------------------------------------------------------------------- Indications:   Chest pain 786.51.  ------------------------------------------------------------------- History:  PMH: Large high risk inferior STEMI.  ------------------------------------------------------------------- Study Conclusions  - Left ventricle: The cavity size was normal. Wall thickness was normal. Systolic function was moderately to severely reduced. The estimated ejection fraction was in the range of 30% to 35%. Basal to mid inferior akinesis. Inferoseptal and inferolateral severe hypokinesis. Left ventricular diastolic function parameters were normal. - Aortic valve: There was no stenosis. - Mitral valve: There was no significant regurgitation. - Left atrium: The atrium was mildly dilated. - Right ventricle: The cavity size was normal. Systolic function was mildly reduced. - Right atrium: The atrium was mildly dilated. - Tricuspid valve: Peak RV-RA gradient (S): 20 mm Hg. - Pulmonary arteries: PA peak pressure: 28 mm Hg (S). - Systemic veins: IVC measured 2.0 cm with < 50% respirophasic variation, suggesting RA pressure 8 mmHg.  Impressions:  - Normal LV size with EF 30-35. Wall motion abnormalities as above, consistent with large inferior MI. The RV was normal in size with mildly decreased systolic function. No significant mitral regurgitation.         Assessment/Plan: 1. Inferior STEMI due to instent restenosis - s/p PCI - on DAPT - would favor long term use. He is doing well clinically. With the degree of LV dysfunction would favor staying out of work for at least 30 days. His job is pretty physical (working on air planes, on ladders, etc). Not interested in cardiac rehab.   2. Smoking history - he is tapering down. Considering using patches again to aid his quitting. 5 minutes spent in discussion regarding smoking cessation today.   3. Ischemic CM - EF down  to 30 to 35% by echo - will need titration of his medicines with plans for repeat echo. His BP has improved. Will start on low dose ACE.  I will see him back on August 22nd.   4. HLD - on statin  5. Bipolar disorder.  6. Brief run of PAF - this was in the setting of an acute MI - his EKG shows evolving changes and he is in NSR.  7. Elevated LFTs - rechecking today.   Current medicines are reviewed with the patient today.  The patient does not have concerns regarding medicines other than what has been noted above.  The following changes have been made:  See above.  Labs/ tests ordered today  include:    Orders Placed This Encounter  Procedures  . Basic metabolic panel  . CBC  . Hepatic function panel  . EKG 12-Lead     Disposition:   FU with me later this month. Will get follow up arranged with Dr. Swaziland. I have placed him out of work at least 30 days from his initial event.   Patient is agreeable to this plan and will call if any problems develop in the interim.   Signed: Rosalio Macadamia, RN, ANP-C 03/19/2015 10:06 AM  [facility] [address] [place], [place]  [address] Phone: [phone] Fax: [phone]

## 2015-04-05 ENCOUNTER — Ambulatory Visit (INDEPENDENT_AMBULATORY_CARE_PROVIDER_SITE_OTHER): Payer: BLUE CROSS/BLUE SHIELD | Admitting: Nurse Practitioner

## 2015-04-05 ENCOUNTER — Encounter: Payer: Self-pay | Admitting: Nurse Practitioner

## 2015-04-05 VITALS — BP 130/84 | HR 76 | Ht 67.0 in | Wt 148.6 lb

## 2015-04-05 DIAGNOSIS — I2510 Atherosclerotic heart disease of native coronary artery without angina pectoris: Secondary | ICD-10-CM | POA: Diagnosis not present

## 2015-04-05 DIAGNOSIS — E785 Hyperlipidemia, unspecified: Secondary | ICD-10-CM | POA: Diagnosis not present

## 2015-04-05 DIAGNOSIS — I2583 Coronary atherosclerosis due to lipid rich plaque: Secondary | ICD-10-CM

## 2015-04-05 DIAGNOSIS — Z720 Tobacco use: Secondary | ICD-10-CM

## 2015-04-05 LAB — BASIC METABOLIC PANEL
BUN: 17 mg/dL (ref 6–23)
CO2: 27 mEq/L (ref 19–32)
Calcium: 9.8 mg/dL (ref 8.4–10.5)
Chloride: 106 mEq/L (ref 96–112)
Creatinine, Ser: 1.07 mg/dL (ref 0.40–1.50)
GFR: 74.62 mL/min (ref >60.00)
Glucose, Bld: 98 mg/dL (ref 70–99)
Potassium: 4.5 mEq/L (ref 3.5–5.1)
Sodium: 140 mEq/L (ref 135–145)

## 2015-04-05 MED ORDER — LOSARTAN POTASSIUM 25 MG PO TABS: 12.5000 mg | ORAL_TABLET | Freq: Every day | ORAL | Status: DC

## 2015-04-05 NOTE — Patient Instructions (Addendum)
We will be checking the following labs today - BMET   Medication Instructions:    Continue with your current medicines BUT  I am stopping the Lisinopril  I am starting Losartan 25 mg to take just 1/2 pill (12.5) each day    Testing/Procedures To Be Arranged:  N/A  Follow-Up:   See Dr. Swaziland in 3 to 4 weeks    Other Special Instructions:   I will send a note to cardiac rehab  We are keeping you out of work until September 27th.   Call the [facility] office at [phone] if you have any questions, problems or concerns.

## 2015-04-05 NOTE — Progress Notes (Signed)
CARDIOLOGY OFFICE NOTE  Date:  04/05/2015    Jason Maldonado Date of Birth: [birth year] Medical Record #[id number]  PCP:  Lucilla Edin, MD  Cardiologist:  [place]    Chief Complaint  Patient presents with  . Coronary Artery Disease    Follow up visit - seen for Dr. Swaziland.     History of Present Illness: Jason Maldonado is a 59 y.o. male who presents today for a follow up visit. Seen for Dr. [place]. I have seen him back in 2014. He has a past medical history of hyperlipidemia, bipolar disorder, tobacco abuse, and a history of CAD status post previous inferior MI in 2014 with PCI to the RCA.   He presented to [facility] on 03/10/2015 as a inferior STEMI in the setting of RCA in-stent restenosis.   Cardiac catheterization on 03/10/2015 showed 100% stenosis of prox and mid RCA prox to a previously placed unknown stent, 4.038 mm Resolute DES stent postdilated to 4.41 mm proximally and 4.1 mm distally. 50% residual RPDA, 40% 1st RPLB, 40%distal RCA lesion. Post cath, he was placed on aspirin and Brilinta. Echo EF 30-35%, basal to mid inferior akinesis, inferoseptal and inferolateral severe hypokinesis. He did have some hypotension on 7/28, however this was gradually improved. Discharged on Brilinta. Not interested in smoking cessation.   I saw him back about a week after discharge. He was doing ok. He was not aware of his low EF. He was tapering in regards to his smoking. Needed to clarify his work issues.   Comes in today. Here with his wife today. No medicines taken yet today. He is doing ok. Wife does not feel like he is doing that well. Complaining of bouts of sweats/nausea - they feel this is due to Lisinopril. He is pretty fatigued. She feels like he is sleeping too much. Not really short of breath. No chest pain. Down to 5 cigs/day now. He understands the need to stop. Not dizzy.   Past Medical History  Diagnosis Date  . Bipolar 1 disorder   . Tobacco abuse   . PAF  (paroxysmal atrial fibrillation)     a. noted on admission 02/2015 in setting of inf stemi;  b. CHA2DS2VASc = 1.  Marland Kitchen CAD (coronary artery disease)     a. STEMI 09/2012  single vessel occlusive CAD of the RCA s/p DES to mid RCA, EF 45%;  b. inferior STEMI 03/10/2015 DES to prox and mid RCA  . LV dysfunction     a. 09/2012 Cath LV gram severe basal to midinferior wall hypokinesis, EF 45% and echo EF 55%.  Marland Kitchen Hyperlipidemia   . Depression     Past Surgical History  Procedure Laterality Date  . Left heart catheterization with coronary angiogram N/A 09/27/2012    Procedure: LEFT HEART CATHETERIZATION WITH CORONARY ANGIOGRAM;  Surgeon: [name], MD;  Location: [facility] CATH LAB;  Service: Cardiovascular;  Laterality: N/A;  . Cardiac catheterization N/A 03/10/2015    Procedure: Left Heart Cath and Coronary Angiography;  Surgeon: Lennette Bihari, MD;  Location: [facility] INVASIVE CV LAB;  Service: Cardiovascular;  Laterality: N/A;  . Cardiac catheterization  03/10/2015    Procedure: Coronary Stent Intervention;  Surgeon: Lennette Bihari, MD;  Location: [facility] INVASIVE CV LAB;  Service: Cardiovascular;;     Medications: Current Outpatient Prescriptions  Medication Sig Dispense Refill  . aspirin EC 81 MG EC tablet Take 1 tablet (81 mg total) by mouth daily.    Marland Kitchen  atorvastatin (LIPITOR) 80 MG tablet Take 1 tablet (80 mg total) by mouth at bedtime. 90 tablet 1  . DULoxetine (CYMBALTA) 60 MG capsule Take 120 mg by mouth daily. (120 mg ) two tablets in the am  2  . nitroGLYCERIN (NITROSTAT) 0.4 MG SL tablet Place 1 tablet (0.4 mg total) under the tongue every 5 (five) minutes as needed for chest pain (up to 3 doses). 25 tablet 3  . ticagrelor (BRILINTA) 90 MG TABS tablet Take 1 tablet (90 mg total) by mouth 2 (two) times daily. 180 tablet 3  . losartan (COZAAR) 25 MG tablet Take 0.5 tablets (12.5 mg total) by mouth daily. 30 tablet 3   No current facility-administered medications for this visit.    Allergies: No Known  Allergies  Social History: The patient  reports that he has been smoking Cigarettes.  He has a 40 pack-year smoking history. He does not have any smokeless tobacco history on file. He reports that he does not drink alcohol or use illicit drugs.   Family History: The patient's family history includes CAD in his father.   Review of Systems: Please see the history of present illness.   Otherwise, the review of systems is positive for cough, depression, anxiety, fatigue and palpitations.   All other systems are reviewed and negative.   Physical Exam: VS:  BP 130/84 mmHg  Pulse 76  Ht 5\' 7"  (1.702 m)  Wt 148 lb 9.6 oz (67.405 kg)  BMI 23.27 kg/m2  SpO2 98% .  BMI Body mass index is 23.27 kg/(m^2).  Wt Readings from Last 3 Encounters:  04/05/15 148 lb 9.6 oz (67.405 kg)  03/19/15 148 lb 6.4 oz (67.314 kg)  03/12/15 152 lb 6.4 oz (69.128 kg)    General: Pleasant. Well developed, well nourished and in no acute distress. Color is chronically sallow.  HEENT: Normal. Neck: Supple, no JVD, carotid bruits, or masses noted.  Cardiac: Regular rate and rhythm. +S3. No edema.  Respiratory:  Lungs are clear to auscultation bilaterally with normal work of breathing.  GI: Soft and nontender.  MS: No deformity or atrophy. Gait and ROM intact. Skin: Warm and dry. Color is normal.  Neuro:  Strength and sensation are intact and no gross focal deficits noted.  Psych: Alert, appropriate and with normal affect.   LABORATORY DATA:  EKG:  EKG is not ordered today.   Lab Results  Component Value Date   WBC 6.9 03/19/2015   HGB 14.0 03/19/2015   HCT 42.6 03/19/2015   PLT 207.0 03/19/2015   GLUCOSE 137* 03/19/2015   CHOL 127 03/11/2015   TRIG 84 03/11/2015   HDL 35* 03/11/2015   LDLCALC 75 03/11/2015   ALT 18 03/19/2015   AST 20 03/19/2015   NA 137 03/19/2015   K 4.3 03/19/2015   CL 102 03/19/2015   CREATININE 1.07 03/19/2015   BUN 14 03/19/2015   CO2 30 03/19/2015   TSH 0.698 09/27/2012     INR 1.18 09/27/2012   HGBA1C 5.7* 09/27/2012    BNP (last 3 results) No results for input(s): BNP in the last 8760 hours.  ProBNP (last 3 results) No results for input(s): PROBNP in the last 8760 hours.   Other Studies Reviewed Today:   Assessment/Plan: Echocardiogram 03/11/2015 LV EF: 30% -  35%  ------------------------------------------------------------------- Indications:   Chest pain 786.51.  ------------------------------------------------------------------- History:  PMH: Large high risk inferior STEMI.  ------------------------------------------------------------------- Study Conclusions  - Left ventricle: The cavity size was normal. Wall thickness  was normal. Systolic function was moderately to severely reduced. The estimated ejection fraction was in the range of 30% to 35%. Basal to mid inferior akinesis. Inferoseptal and inferolateral severe hypokinesis. Left ventricular diastolic function parameters were normal. - Aortic valve: There was no stenosis. - Mitral valve: There was no significant regurgitation. - Left atrium: The atrium was mildly dilated. - Right ventricle: The cavity size was normal. Systolic function was mildly reduced. - Right atrium: The atrium was mildly dilated. - Tricuspid valve: Peak RV-RA gradient (S): 20 mm Hg. - Pulmonary arteries: PA peak pressure: 28 mm Hg (S). - Systemic veins: IVC measured 2.0 cm with < 50% respirophasic variation, suggesting RA pressure 8 mmHg.  Impressions:  - Normal LV size with EF 30-35. Wall motion abnormalities as above, consistent with large inferior MI. The RV was normal in size with mildly decreased systolic function. No significant mitral regurgitation.    Cardiac catheterization 03/10/2015 Conclusion     Prox RCA to Mid RCA lesion, 100% stenosed. There is a 0% residual stenosis post intervention. The lesion was previously treated with a stent (unknown type) .  A  drug-eluting stent was placed.  There is mild left ventricular systolic dysfunction.  RPDA lesion, 50% stenosed.  1st RPLB lesion, 40% stenosed.  Dist RCA lesion, 40% stenosed.  High risk acute inferior ST segment elevation myocardial infarction secondary to total proximal occlusion of the RCA immediately proximal to the previously placed stent with TIMI 0 flow in a very large dominant RCA.  Normal left coronary system with a normal LAD and circumflex vessel.  Difficult but successful PCI to the RCA with PTCA, ultimate insertion of a 4.038 mm Resolute DES stent postdilated to 4.41 mm proximally and 4.1 mm distally with restoration of TIMI-3 flow; 40% narrowing beyond the stented segment prior to the acute margin, 50% stenosis in the proximal PDA and 40% stenosis in the distal RCA continuation branch.  RECOMMENDATION:  Recommend DAPT indefinitely and after one year consider changing brilinta to 60 mg twice a day per Pegasus trial data. Smoking cessation is imperative. Aggressive lipid-lowering therapy and treatment with beta blocker, ACE inhibitor and nitrate therapy.               Assessment/Plan: 1. Inferior STEMI due to instent restenosis - s/p PCI - on DAPT - would favor long term use. He is doing ok but not really making the progress we would expect. This may be in part due to his psyche issues. I think he should consider cardiac rehab - he is agreeable. Will change the Lisinopril to Losartan and see if the nausea/sweats stop.  With the degree of LV dysfunction would favor staying out of work for another 30 days (extended from 04/10/15 to 05/11/15). His job is pretty physical (working on air planes, on ladders, etc).    2. Smoking history - he is tapering down. Considering using patches again to aid his quitting. 5 minutes spent again in discussion regarding smoking cessation today.   3. Ischemic CM - EF down to 30 to 35% by echo - will need titration of his medicines  with plans for repeat echo. His BP has improved. Changing ACE to ARB today. Would like to try and add beta blocker and maximize meds as much as possible.  See Dr. Swaziland in 4 weeks for further discussion.  4. HLD - on statin  5. Bipolar disorder.  6. Brief run of PAF - this was in the setting of an acute MI -  remains in sinus by exam.  7. Elevated LFTs - recheck showed normalization       Current medicines are reviewed with the patient today.  The patient does not have concerns regarding medicines other than what has been noted above.  The following changes have been made:  See above.  Labs/ tests ordered today include:    Orders Placed This Encounter  Procedures  . Basic metabolic panel     Disposition:   FU with Dr. [place]iland in 4 weeks.   Patient is agreeable to this plan and will call if any problems develop in the interim.   Signed: Rosalio Macadamia, RN, ANP-C 04/05/2015 9:53 AM  [facility] [address] [place], [place]  [address] Phone: [phone] Fax: [phone]

## 2015-04-09 ENCOUNTER — Telehealth: Payer: Self-pay | Admitting: Nurse Practitioner

## 2015-04-09 NOTE — Telephone Encounter (Signed)
New message     Pt wife is wanting to know if it is okay for pt to drive to [facility] Please call to discuss

## 2015-04-09 NOTE — Telephone Encounter (Signed)
S/w pt Norma Fredrickson, NP, stated would be okay for pt to drive to [place], pt stated verbal understanding

## 2015-04-14 ENCOUNTER — Telehealth: Payer: Self-pay | Admitting: Nurse Practitioner

## 2015-04-14 NOTE — Telephone Encounter (Signed)
S/w pt stated Norma Fredrickson, Np, sent note to Cardiac Rehab and ususally takes a couple of weeks to get contacted.  Pt stated appreciation.

## 2015-04-14 NOTE — Telephone Encounter (Signed)
NewMessage  Pt calling to follow up on Cardiac Rehab. Pt stated that he has not been contacted to begin rehab and requested to speak w/ Duwayne Heck concerning this. Please call back and discuss.

## 2015-04-14 NOTE — Telephone Encounter (Signed)
New message     Pt wife calling to get status update on pt being able to attend cardiac rehab

## 2015-04-14 NOTE — Telephone Encounter (Signed)
S/w pt's wife per DPR stated s/w pt 5 mintues ago about Cardiac Rehab. Pt's wife stated pt wants to go to [place], [place] and does not want to miss cardia rehab when will rehab be calling.  Once we contact rehab it is out of our office's hands. I stated could call over to rehab and ask for status. Pt's wife stated appreciation.

## 2015-04-15 ENCOUNTER — Telehealth: Payer: Self-pay | Admitting: Cardiology

## 2015-04-15 NOTE — Telephone Encounter (Signed)
Left msg for Minerva Areola to return call.

## 2015-04-15 NOTE — Telephone Encounter (Signed)
New message     Calling to verify stent placement on 03-10-15 and patient is still under our care.  This is for disability

## 2015-04-27 ENCOUNTER — Ambulatory Visit (INDEPENDENT_AMBULATORY_CARE_PROVIDER_SITE_OTHER): Payer: BLUE CROSS/BLUE SHIELD | Admitting: Cardiology

## 2015-04-27 ENCOUNTER — Encounter: Payer: Self-pay | Admitting: Cardiology

## 2015-04-27 VITALS — BP 140/82 | HR 82 | Ht 67.0 in | Wt 151.7 lb

## 2015-04-27 DIAGNOSIS — I2510 Atherosclerotic heart disease of native coronary artery without angina pectoris: Secondary | ICD-10-CM | POA: Diagnosis not present

## 2015-04-27 DIAGNOSIS — Z720 Tobacco use: Secondary | ICD-10-CM | POA: Diagnosis not present

## 2015-04-27 DIAGNOSIS — E785 Hyperlipidemia, unspecified: Secondary | ICD-10-CM

## 2015-04-27 DIAGNOSIS — I519 Heart disease, unspecified: Secondary | ICD-10-CM | POA: Diagnosis not present

## 2015-04-27 DIAGNOSIS — I5022 Chronic systolic (congestive) heart failure: Secondary | ICD-10-CM

## 2015-04-27 MED ORDER — CARVEDILOL 3.125 MG PO TABS: 3.1250 mg | ORAL_TABLET | Freq: Two times a day (BID) | ORAL | Status: DC

## 2015-04-27 NOTE — Patient Instructions (Signed)
Continue your current therapy  Add carvedilol 3.125 mg twice a day  Continue efforts at smoking cessation and cardiac Rehab  We will follow up in 3-4 weeks  You will get an Echo at the end of October  You are clear to return to work the end of this month.

## 2015-04-27 NOTE — Progress Notes (Signed)
CARDIOLOGY OFFICE NOTE  Date:  04/27/2015    Jason Maldonado Date of Birth: October 14, 1955 Medical Record #[id number]  PCP:  Lucilla Edin, MD  Cardiologist:  [place]    Chief Complaint  Patient presents with  . Follow-up    some dizziness    History of Present Illness: Jason Maldonado is a 59 y.o. male who is seen for follow up CAD.  He has a past medical history of hyperlipidemia, bipolar disorder, tobacco abuse, and a history of CAD status post previous inferior MI in 2014 with PCI to the RCA.   He presented to [facility] on 03/10/2015 as a inferior STEMI in the setting of RCA in-stent restenosis/occlusion.   Cardiac catheterization on 03/10/2015 showed 100% stenosis of prox and mid RCA prox to a previously placed unknown stent, 4.038 mm Resolute DES stent postdilated to 4.41 mm proximally and 4.1 mm distally. 50% residual RPDA, 40% 1st RPLB, 40%distal RCA lesion. Post cath, he was placed on aspirin and Brilinta. Echo EF 30-35%, basal to mid inferior akinesis, inferoseptal and inferolateral severe hypokinesis. He did have refractory hypotension that gradually improved. Discharged on Brilinta.   On follow up today he is feeling well. Still complains of sweats at night. This was felt to possibly be related to ACEi. He was switched to losartan with no change. Still smokes 1-2 cigarettes/day. Trying to quit by chewing gum. Has been walking regularly on the flat. Has orientation to cardiac rehab this week. Denies any chest pain or SOB.    Past Medical History  Diagnosis Date  . Bipolar 1 disorder   . Tobacco abuse   . PAF (paroxysmal atrial fibrillation)     a. noted on admission 02/2015 in setting of inf stemi;  b. CHA2DS2VASc = 1.  Marland Kitchen CAD (coronary artery disease)     a. STEMI 09/2012  single vessel occlusive CAD of the RCA s/p DES to mid RCA, EF 45%;  b. inferior STEMI 03/10/2015 DES to prox and mid RCA  . LV dysfunction     a. 09/2012 Cath LV gram severe basal to midinferior  wall hypokinesis, EF 45% and echo EF 55%.  Marland Kitchen Hyperlipidemia   . Depression     Past Surgical History  Procedure Laterality Date  . Left heart catheterization with coronary angiogram N/A 09/27/2012    Procedure: LEFT HEART CATHETERIZATION WITH CORONARY ANGIOGRAM;  Surgeon: Larisha Vencill M [place]il[place], MD;  Location: [facility] CATH LAB;  Service: Cardiovascular;  Laterality: N/A;  . Cardiac catheterization N/A 03/10/2015    Procedure: Left Heart Cath and Coronary Angiography;  Surgeon: Lennette Bihari, MD;  Location: [facility] INVASIVE CV LAB;  Service: Cardiovascular;  Laterality: N/A;  . Cardiac catheterization  03/10/2015    Procedure: Coronary Stent Intervention;  Surgeon: Lennette Bihari, MD;  Location: [facility] INVASIVE CV LAB;  Service: Cardiovascular;;     Medications: Current Outpatient Prescriptions  Medication Sig Dispense Refill  . aspirin EC 81 MG EC tablet Take 1 tablet (81 mg total) by mouth daily.    Marland Kitchen atorvastatin (LIPITOR) 80 MG tablet Take 1 tablet (80 mg total) by mouth at bedtime. 90 tablet 1  . DULoxetine (CYMBALTA) 60 MG capsule Take 120 mg by mouth daily. (120 mg ) two tablets in the am  2  . losartan (COZAAR) 25 MG tablet Take 0.5 tablets (12.5 mg total) by mouth daily. 30 tablet 3  . nitroGLYCERIN (NITROSTAT) 0.4 MG SL tablet Place 1 tablet (0.4 mg total) under  the tongue every 5 (five) minutes as needed for chest pain (up to 3 doses). 25 tablet 3  . ticagrelor (BRILINTA) 90 MG TABS tablet Take 1 tablet (90 mg total) by mouth 2 (two) times daily. 180 tablet 3  . carvedilol (COREG) 3.125 MG tablet Take 1 tablet (3.125 mg total) by mouth 2 (two) times daily. 180 tablet 3   No current facility-administered medications for this visit.    Allergies: No Known Allergies  Social History: The patient  reports that he has been smoking Cigarettes.  He has a 40 pack-year smoking history. He does not have any smokeless tobacco history on file. He reports that he does not drink alcohol or use illicit drugs.     Family History: The patient's family history includes CAD in his father.   Review of Systems: Please see the history of present illness.    All other systems are reviewed and negative.   Physical Exam: VS:  BP 140/82 mmHg  Pulse 82  Ht  (1.702 m)  Wt 68.811 kg (151 lb 11.2 oz)  BMI 23.75 kg/m2 .  BMI Body mass index is 23.75 kg/(m^2).  Wt Readings from Last 3 Encounters:  04/27/15 68.811 kg (151 lb 11.2 oz)  04/05/15 67.405 kg (148 lb 9.6 oz)  03/19/15 67.314 kg (148 lb 6.4 oz)    General: Pleasant. Well developed, well nourished and in no acute distress. Color is chronically sallow.  HEENT: Normal. Neck: Supple, no JVD, carotid bruits, or masses noted.  Cardiac: Regular rate and rhythm. +S3. No edema.  Respiratory:  Lungs are clear to auscultation bilaterally with normal work of breathing.  GI: Soft and nontender.  MS: No deformity or atrophy. Gait and ROM intact. Skin: Warm and dry. Color is normal.  Neuro:  Strength and sensation are intact and no gross focal deficits noted.  Psych: Alert, appropriate and with normal affect.   LABORATORY DATA:  EKG:  EKG is not ordered today.   Lab Results  Component Value Date   WBC 6.9 03/19/2015   HGB 14.0 03/19/2015   HCT 42.6 03/19/2015   PLT 207.0 03/19/2015   GLUCOSE 98 04/05/2015   CHOL 127 03/11/2015   TRIG 84 03/11/2015   HDL 35* 03/11/2015   LDLCALC 75 03/11/2015   ALT 18 03/19/2015   AST 20 03/19/2015   NA 140 04/05/2015   K 4.5 04/05/2015   CL 106 04/05/2015   CREATININE 1.07 04/05/2015   BUN 17 04/05/2015   CO2 27 04/05/2015   TSH 0.698 09/27/2012   INR 1.18 09/27/2012   HGBA1C 5.7* 09/27/2012    BNP (last 3 results) No results for input(s): BNP in the last 8760 hours.  ProBNP (last 3 results) No results for input(s): PROBNP in the last 8760 hours.   Other Studies Reviewed Today:   Assessment/Plan: Echocardiogram 03/11/2015 LV EF: 30% -   35%  ------------------------------------------------------------------- Indications:   Chest pain 786.51.  ------------------------------------------------------------------- History:  PMH: Large high risk inferior STEMI.  ------------------------------------------------------------------- Study Conclusions  - Left ventricle: The cavity size was normal. Wall thickness was normal. Systolic function was moderately to severely reduced. The estimated ejection fraction was in the range of 30% to 35%. Basal to mid inferior akinesis. Inferoseptal and inferolateral severe hypokinesis. Left ventricular diastolic function parameters were normal. - Aortic valve: There was no stenosis. - Mitral valve: There was no significant regurgitation. - Left atrium: The atrium was mildly dilated. - Right ventricle: The cavity size was normal. Systolic function  was mildly reduced. - Right atrium: The atrium was mildly dilated. - Tricuspid valve: Peak RV-RA gradient (S): 20 mm Hg. - Pulmonary arteries: PA peak pressure: 28 mm Hg (S). - Systemic veins: IVC measured 2.0 cm with < 50% respirophasic variation, suggesting RA pressure 8 mmHg.  Impressions:  - Normal LV size with EF 30-35. Wall motion abnormalities as above, consistent with large inferior MI. The RV was normal in size with mildly decreased systolic function. No significant mitral regurgitation.    Cardiac catheterization 03/10/2015 Conclusion     Prox RCA to Mid RCA lesion, 100% stenosed. There is a 0% residual stenosis post intervention. The lesion was previously treated with a stent (unknown type) .  A drug-eluting stent was placed.  There is mild left ventricular systolic dysfunction.  RPDA lesion, 50% stenosed.  1st RPLB lesion, 40% stenosed.  Dist RCA lesion, 40% stenosed.  High risk acute inferior ST segment elevation myocardial infarction secondary to total proximal occlusion of the RCA  immediately proximal to the previously placed stent with TIMI 0 flow in a very large dominant RCA.  Normal left coronary system with a normal LAD and circumflex vessel.  Difficult but successful PCI to the RCA with PTCA, ultimate insertion of a 4.038 mm Resolute DES stent postdilated to 4.41 mm proximally and 4.1 mm distally with restoration of TIMI-3 flow; 40% narrowing beyond the stented segment prior to the acute margin, 50% stenosis in the proximal PDA and 40% stenosis in the distal RCA continuation branch.  RECOMMENDATION:  Recommend DAPT indefinitely and after one year consider changing brilinta to 60 mg twice a day per Pegasus trial data. Smoking cessation is imperative. Aggressive lipid-lowering therapy and treatment with beta blocker, ACE inhibitor and nitrate therapy.               Assessment/Plan: 1. Inferior STEMI due to instent occlusion - s/p PCI with repeat DES placement- on DAPT - would favor continued use indefinitely. Can reduce Brilinta to 60 mg bid at one year. Encourage exercise program. Will clear him to return to work at the end of this month.    2. Smoking history - he is tapering down. Encourage complete cessation.  3. Ischemic CM - EF down to 30 to 35% by echo - will need titration of his medicines with plans for repeat echo at the end of October. His BP has improved. Continue current ARB dose. Add Coreg 3.125 mg bid. Follow up in 3-4 weeks for further titration of meds.   4. HLD - on statin  5. Bipolar disorder.  6. Brief run of PAF - this was in the setting of an acute MI - remains in sinus by exam.  7. Elevated LFTs - recheck showed normalization       Current medicines are reviewed with the patient today.  The patient does not have concerns regarding medicines other than what has been noted above.  The following changes have been made:  See above.  Labs/ tests ordered today include:    Orders Placed This Encounter  Procedures  .  ECHOCARDIOGRAM COMPLETE     Disposition:   FU with 3-4 weeks   Patient is agreeable to this plan and will call if any problems develop in the interim.   Signed: Birney Belshe [place] MD, [facility]    04/27/2015 4:13 PM

## 2015-04-29 ENCOUNTER — Encounter (HOSPITAL_COMMUNITY)
Admission: RE | Admit: 2015-04-29 | Discharge: 2015-04-29 | Disposition: A | Discharge status: Home / Self Care | Payer: BLUE CROSS/BLUE SHIELD | Source: Ambulatory Visit | Attending: Cardiology | Admitting: Cardiology

## 2015-04-29 DIAGNOSIS — Z955 Presence of coronary angioplasty implant and graft: Secondary | ICD-10-CM | POA: Insufficient documentation

## 2015-04-29 DIAGNOSIS — I213 ST elevation (STEMI) myocardial infarction of unspecified site: Secondary | ICD-10-CM | POA: Insufficient documentation

## 2015-04-29 NOTE — Progress Notes (Signed)
Cardiac Rehab Medication Review by a Pharmacist  Does the patient  feel that his/her medications are working for him/her?  yes  Has the patient been experiencing any side effects to the medications prescribed?  yes  Does the patient measure his/her own blood pressure or blood glucose at home?  no   Does the patient have any problems obtaining medications due to transportation or finances?   no  Understanding of regimen: excellent Understanding of indications: excellent Potential of compliance: excellent    Pharmacist comments: Reports bouts of mild sweating at night since starting losartan.    Jason Maldonado 04/29/2015 8:27 AM

## 2015-05-03 ENCOUNTER — Encounter (HOSPITAL_COMMUNITY)
Admission: RE | Admit: 2015-05-03 | Discharge: 2015-05-03 | Disposition: A | Discharge status: Home / Self Care | Payer: BLUE CROSS/BLUE SHIELD | Source: Ambulatory Visit | Attending: Cardiology | Admitting: Cardiology

## 2015-05-03 ENCOUNTER — Encounter (HOSPITAL_COMMUNITY): Payer: Self-pay

## 2015-05-03 ENCOUNTER — Encounter (HOSPITAL_COMMUNITY): Payer: BLUE CROSS/BLUE SHIELD

## 2015-05-03 DIAGNOSIS — Z955 Presence of coronary angioplasty implant and graft: Secondary | ICD-10-CM | POA: Diagnosis not present

## 2015-05-03 DIAGNOSIS — I213 ST elevation (STEMI) myocardial infarction of unspecified site: Secondary | ICD-10-CM | POA: Diagnosis present

## 2015-05-03 NOTE — Progress Notes (Signed)
Pt started cardiac rehab today.  Pt tolerated light exercise without difficulty. VSS, telemetry-sinus rhythm, t wave inversion, asymptomatic.  Medication list reconciled.  Pt verbalized compliance with medications and denies barriers to compliance. PSYCHOSOCIAL ASSESSMENT:  PHQ-1. Pt is currently being treated with cymbalta as prescribed by Melony Overly with [facility].  Pt feels the cymbalta is very effective in managing his symptoms.   Pt exhibits positive coping skills, hopeful outlook with supportive family. No psychosocial needs identified at this time, no psychosocial interventions necessary.    Pt enjoys working and is looking forward to being able to return in next few weeks.  Pt indicates he does not have any other pleasurable activities. Pt is looking forward to developing a home exercise routine and feels this will be a good recreational outlet for him.  Pt has stopped smoking cigarettes reportedly 2 weeks ago.  Pt states he is currentty using nicotine gum but like to switch to nicotine patches. Pt advised against smoking while wearing the patch or chewing gum.  Pt congratulated on his cessation efforts and encouraged to contact [employer] for tobacco cessation counselor support.    Pt cardiac rehab  goal is  to stop smoking and develop home exercise routine.  Pt admits he is only planning to participate in cardiac rehab for a few sessions prior to returning to work.  .  Pt encouraged to participate in utilizing [employer] resources, home exercise and exercising on your own education classes to increase ability to achieve these goals.   Pt oriented to exercise equipment and routine.  Understanding verbalized.

## 2015-05-05 ENCOUNTER — Encounter (HOSPITAL_COMMUNITY)
Admission: RE | Admit: 2015-05-05 | Discharge: 2015-05-05 | Disposition: A | Discharge status: Home / Self Care | Payer: BLUE CROSS/BLUE SHIELD | Source: Ambulatory Visit | Attending: Cardiology | Admitting: Cardiology

## 2015-05-05 DIAGNOSIS — I213 ST elevation (STEMI) myocardial infarction of unspecified site: Secondary | ICD-10-CM | POA: Diagnosis not present

## 2015-05-07 ENCOUNTER — Encounter (HOSPITAL_COMMUNITY)
Admission: RE | Admit: 2015-05-07 | Discharge: 2015-05-07 | Disposition: A | Discharge status: Home / Self Care | Payer: BLUE CROSS/BLUE SHIELD | Source: Ambulatory Visit | Attending: Cardiology | Admitting: Cardiology

## 2015-05-07 DIAGNOSIS — I213 ST elevation (STEMI) myocardial infarction of unspecified site: Secondary | ICD-10-CM | POA: Diagnosis not present

## 2015-05-10 ENCOUNTER — Encounter (HOSPITAL_COMMUNITY)
Admission: RE | Admit: 2015-05-10 | Discharge: 2015-05-10 | Disposition: A | Discharge status: Home / Self Care | Payer: BLUE CROSS/BLUE SHIELD | Source: Ambulatory Visit | Attending: Cardiology | Admitting: Cardiology

## 2015-05-10 DIAGNOSIS — I213 ST elevation (STEMI) myocardial infarction of unspecified site: Secondary | ICD-10-CM | POA: Diagnosis not present

## 2015-05-10 NOTE — Progress Notes (Signed)
Pt discharged early cardiac rehab program today with completion of 4 exercise sessions in Phase II. Pt exited program early to return to work.   Medication list reconciled. Repeat  PHQ score-0  . Pt feels he has achieved his goals during cardiac rehab which include self confidence to safely exercise at home and return to work.   Pt was given home exercise guidelines and nutrition handouts.  Pt declined handouts from other life style modification classes.  Pt plans to continue exercising on his own at home.

## 2015-05-12 ENCOUNTER — Encounter (HOSPITAL_COMMUNITY): Payer: BLUE CROSS/BLUE SHIELD

## 2015-05-14 ENCOUNTER — Encounter (HOSPITAL_COMMUNITY): Payer: BLUE CROSS/BLUE SHIELD

## 2015-05-17 ENCOUNTER — Encounter (HOSPITAL_COMMUNITY): Payer: BLUE CROSS/BLUE SHIELD

## 2015-05-18 ENCOUNTER — Encounter: Payer: Self-pay | Admitting: Emergency Medicine

## 2015-05-19 ENCOUNTER — Encounter (HOSPITAL_COMMUNITY): Payer: BLUE CROSS/BLUE SHIELD

## 2015-05-20 ENCOUNTER — Ambulatory Visit: Payer: BLUE CROSS/BLUE SHIELD | Admitting: Cardiology

## 2015-05-21 ENCOUNTER — Encounter (HOSPITAL_COMMUNITY): Payer: BLUE CROSS/BLUE SHIELD

## 2015-05-24 ENCOUNTER — Encounter (HOSPITAL_COMMUNITY): Payer: BLUE CROSS/BLUE SHIELD

## 2015-05-26 ENCOUNTER — Encounter (HOSPITAL_COMMUNITY): Payer: BLUE CROSS/BLUE SHIELD

## 2015-05-28 ENCOUNTER — Encounter (HOSPITAL_COMMUNITY): Payer: BLUE CROSS/BLUE SHIELD

## 2015-05-31 ENCOUNTER — Encounter (HOSPITAL_COMMUNITY): Payer: BLUE CROSS/BLUE SHIELD

## 2015-06-02 ENCOUNTER — Encounter (HOSPITAL_COMMUNITY): Payer: BLUE CROSS/BLUE SHIELD

## 2015-06-04 ENCOUNTER — Encounter (HOSPITAL_COMMUNITY): Payer: BLUE CROSS/BLUE SHIELD

## 2015-06-07 ENCOUNTER — Encounter (HOSPITAL_COMMUNITY): Payer: BLUE CROSS/BLUE SHIELD

## 2015-06-08 ENCOUNTER — Other Ambulatory Visit: Payer: Self-pay | Admitting: Cardiology

## 2015-06-08 ENCOUNTER — Other Ambulatory Visit: Payer: Self-pay

## 2015-06-08 ENCOUNTER — Ambulatory Visit (HOSPITAL_COMMUNITY): Payer: BLUE CROSS/BLUE SHIELD | Attending: Internal Medicine

## 2015-06-08 DIAGNOSIS — E785 Hyperlipidemia, unspecified: Secondary | ICD-10-CM

## 2015-06-08 DIAGNOSIS — F17200 Nicotine dependence, unspecified, uncomplicated: Secondary | ICD-10-CM | POA: Diagnosis not present

## 2015-06-08 DIAGNOSIS — Z720 Tobacco use: Secondary | ICD-10-CM

## 2015-06-08 DIAGNOSIS — I5022 Chronic systolic (congestive) heart failure: Secondary | ICD-10-CM

## 2015-06-08 DIAGNOSIS — I2510 Atherosclerotic heart disease of native coronary artery without angina pectoris: Secondary | ICD-10-CM

## 2015-06-08 DIAGNOSIS — I519 Heart disease, unspecified: Secondary | ICD-10-CM | POA: Diagnosis not present

## 2015-06-09 ENCOUNTER — Encounter (HOSPITAL_COMMUNITY): Payer: BLUE CROSS/BLUE SHIELD

## 2015-06-09 ENCOUNTER — Ambulatory Visit (INDEPENDENT_AMBULATORY_CARE_PROVIDER_SITE_OTHER): Payer: BLUE CROSS/BLUE SHIELD | Admitting: Nurse Practitioner

## 2015-06-09 ENCOUNTER — Encounter: Payer: Self-pay | Admitting: Nurse Practitioner

## 2015-06-09 VITALS — BP 132/80 | HR 73 | Ht 67.0 in | Wt 151.0 lb

## 2015-06-09 DIAGNOSIS — I5022 Chronic systolic (congestive) heart failure: Secondary | ICD-10-CM | POA: Diagnosis not present

## 2015-06-09 DIAGNOSIS — I2510 Atherosclerotic heart disease of native coronary artery without angina pectoris: Secondary | ICD-10-CM | POA: Diagnosis not present

## 2015-06-09 DIAGNOSIS — Z720 Tobacco use: Secondary | ICD-10-CM

## 2015-06-09 DIAGNOSIS — E785 Hyperlipidemia, unspecified: Secondary | ICD-10-CM | POA: Diagnosis not present

## 2015-06-09 DIAGNOSIS — I519 Heart disease, unspecified: Secondary | ICD-10-CM

## 2015-06-09 LAB — HEPATIC FUNCTION PANEL
ALT: 25 U/L (ref 9–46)
AST: 23 U/L (ref 10–35)
Albumin: 4.1 g/dL (ref 3.6–5.1)
Alkaline Phosphatase: 70 U/L (ref 40–115)
Bilirubin, Direct: 0.1 mg/dL (ref <=0.2)
Indirect Bilirubin: 0.5 mg/dL (ref 0.2–1.2)
Total Bilirubin: 0.6 mg/dL (ref 0.2–1.2)
Total Protein: 6.7 g/dL (ref 6.1–8.1)

## 2015-06-09 LAB — BASIC METABOLIC PANEL
BUN: 15 mg/dL (ref 7–25)
CO2: 24 mmol/L (ref 20–31)
Calcium: 9.1 mg/dL (ref 8.6–10.3)
Chloride: 104 mmol/L (ref 98–110)
Creat: 1.07 mg/dL (ref 0.70–1.25)
Glucose, Bld: 108 mg/dL — ABNORMAL HIGH (ref 65–99)
Potassium: 4.4 mmol/L (ref 3.5–5.3)
Sodium: 138 mmol/L (ref 135–146)

## 2015-06-09 LAB — LIPID PANEL
Cholesterol: 150 mg/dL (ref 125–200)
HDL: 50 mg/dL (ref >=40)
LDL Cholesterol: 84 mg/dL (ref <130)
Total CHOL/HDL Ratio: 3 Ratio (ref <=5.0)
Triglycerides: 78 mg/dL (ref <150)
VLDL: 16 mg/dL (ref <30)

## 2015-06-09 MED ORDER — NICOTINE 21 MG/24HR TD PT24: 21.0000 mg | MEDICATED_PATCH | Freq: Every day | TRANSDERMAL | Status: DC

## 2015-06-09 NOTE — Patient Instructions (Addendum)
We will be checking the following labs today - BMET, Lipids, HPF and CBC  Medication Instructions:    Continue with your current medicines.   I did send in a RX for nicoderm patches - this is at your drug store    Testing/Procedures To Be Arranged:  N/A  Follow-Up:   See Dr. SwazilandJordan in 4 months    Other Special Instructions:   N/A    If you need a refill on your cardiac medications before your next appointment, please call your pharmacy.   Call the [facility] office at [phone] if you have any questions, problems or concerns.

## 2015-06-09 NOTE — Progress Notes (Signed)
CARDIOLOGY OFFICE NOTE  Date:  06/09/2015    Jason Maldonado Date of Birth: [birth year] Medical Record #[id number]  PCP:  Jason Edin, MD  Cardiologist:  Jason Maldonado    Chief Complaint  Patient presents with  . Cardiomyopathy    Post echo visit - seen for Dr. Swaziland  . Coronary Artery Disease    History of Present Illness: Jason Maldonado is a 59 y.o. male who presents today for a follow up visit. Seen for Dr. Swaziland. He has known hyperlipidemia, bipolar disorder, tobacco abuse, and a history of CAD status post previous inferior MI in 2014 with PCI to the RCA.   He presented to [facility] on 03/10/2015 as a inferior STEMI in the setting of RCA in-stent restenosis/occlusion.   Cardiac catheterization on 03/10/2015 showed 100% stenosis of prox and mid RCA prox to a previously placed unknown stent, 4.038 mm Resolute DES stent postdilated to 4.41 mm proximally and 4.1 mm distally. 50% residual RPDA, 40% 1st RPLB, 40%distal RCA lesion. Post cath, he was placed on aspirin and Brilinta. Echo EF 30-35%, basal to mid inferior akinesis, inferoseptal and inferolateral severe hypokinesis. He did have refractory hypotension that gradually improved. Discharged on Brilinta.   I have seen him back a couple of times - trying to put on better CHF regimen. Saw Dr. Swaziland last month and was doing well. Limited echo earlier this week shows that his pumping function has improved.   Comes in today. Here alone. Doing well. Energy has improved. Fasting today for labs. No chest pain. Not short of breath. Feels ok on his medicines. Really seems to be trying to work on his smoking - asking about using the patches - will send in RX for him. Echo results already given to him by [facility].   Past Medical History  Diagnosis Date  . Bipolar 1 disorder (HCC)   . Tobacco abuse   . PAF (paroxysmal atrial fibrillation) (HCC)     a. noted on admission 02/2015 in setting of inf stemi;  b. CHA2DS2VASc = 1.  Jason Maldonado CAD  (coronary artery disease)     a. STEMI 09/2012  single vessel occlusive CAD of the RCA s/p DES to mid RCA, EF 45%;  b. inferior STEMI 03/10/2015 DES to prox and mid RCA  . LV dysfunction     a. 09/2012 Cath LV gram severe basal to midinferior wall hypokinesis, EF 45% and echo EF 55%.  Jason Maldonado Hyperlipidemia   . Depression     Past Surgical History  Procedure Laterality Date  . Left heart catheterization with coronary angiogram N/A 09/27/2012    Procedure: LEFT HEART CATHETERIZATION WITH CORONARY ANGIOGRAM;  Surgeon: [name], MD;  Location: [facility] CATH LAB;  Service: Cardiovascular;  Laterality: N/A;  . Cardiac catheterization N/A 03/10/2015    Procedure: Left Heart Cath and Coronary Angiography;  Surgeon: Lennette Bihari, MD;  Location: [facility] INVASIVE CV LAB;  Service: Cardiovascular;  Laterality: N/A;  . Cardiac catheterization  03/10/2015    Procedure: Coronary Stent Intervention;  Surgeon: Lennette Bihari, MD;  Location[facility] INVASIVE CV LAB;  Service: Cardiovascular;;     Medications: Current Outpatient Prescriptions  Medication Sig Dispense Refill  . ALPRAZolam (XANAX) 0.5 MG tablet TAKE 1/2 TO 1 TABLET BY MOUTH EVERY 8 HOURS AS NEEDED FOR ANXIETY  0  . aspirin EC 81 MG EC tablet Take 1 tablet (81 mg total) by mouth daily.    Jason Maldonado atorvastatin (LIPITOR) 80 MG  tablet Take 1 tablet (80 mg total) by mouth at bedtime. 90 tablet 1  . carvedilol (COREG) 3.125 MG tablet Take 1 tablet (3.125 mg total) by mouth 2 (two) times daily. 180 tablet 3  . DULoxetine (CYMBALTA) 60 MG capsule Take 120 mg by mouth daily. (120 mg ) two tablets in the am  2  . losartan (COZAAR) 25 MG tablet Take 0.5 tablets (12.5 mg total) by mouth daily. 30 tablet 3  . nitroGLYCERIN (NITROSTAT) 0.4 MG SL tablet Place 1 tablet (0.4 mg total) under the tongue every 5 (five) minutes as needed for chest pain (up to 3 doses). 25 tablet 3  . ticagrelor (BRILINTA) 90 MG TABS tablet Take 1 tablet (90 mg total) by mouth 2 (two) times daily. 180  tablet 3  . nicotine (NICODERM CQ) 21 mg/24hr patch Place 1 patch (21 mg total) onto the skin daily. 28 patch 0   No current facility-administered medications for this visit.    Allergies: No Known Allergies  Social History: The patient  reports that he quit smoking about 7 weeks ago. His smoking use included Cigarettes. He has a 40 pack-year smoking history. He does not have any smokeless tobacco history on file. He reports that he does not drink alcohol or use illicit drugs.   Family History: The patient's family history includes CAD in his father.   Review of Systems: Please see the history of present illness.   Otherwise, the review of systems is positive for none.   All other systems are reviewed and negative.   Physical Exam: VS:  BP 132/80 mmHg  Pulse 73  Ht 5\' 7"  (1.702 m)  Wt 151 lb (68.493 kg)  BMI 23.64 kg/m2  SpO2 99% .  BMI Body mass index is 23.64 kg/(m^2).  Wt Readings from Last 3 Encounters:  06/09/15 151 lb (68.493 kg)  04/29/15 151 lb 0.2 oz (68.5 kg)  04/27/15 151 lb 11.2 oz (68.811 kg)    General: Pleasant. Thin. He is in no acute distress. Color is always "off".  HEENT: Normal. Neck: Supple, no JVD, carotid bruits, or masses noted.  Cardiac: Regular rate and rhythm. No murmurs, rubs, or gallops. No edema.  Respiratory:  Lungs are clear to auscultation bilaterally with normal work of breathing.  GI: Soft and nontender.  MS: No deformity or atrophy. Gait and ROM intact. Skin: Warm and dry. Color is normal.  Neuro:  Strength and sensation are intact and no gross focal deficits noted.  Psych: Alert, appropriate and with normal affect.   LABORATORY DATA:  EKG:  EKG is not ordered today.  Lab Results  Component Value Date   WBC 6.9 03/19/2015   HGB 14.0 03/19/2015   HCT 42.6 03/19/2015   PLT 207.0 03/19/2015   GLUCOSE 98 04/05/2015   CHOL 127 03/11/2015   TRIG 84 03/11/2015   HDL 35* 03/11/2015   LDLCALC 75 03/11/2015   ALT 18 03/19/2015    AST 20 03/19/2015   NA 140 04/05/2015   K 4.5 04/05/2015   CL 106 04/05/2015   CREATININE 1.07 04/05/2015   BUN 17 04/05/2015   CO2 27 04/05/2015   TSH 0.698 09/27/2012   INR 1.18 09/27/2012   HGBA1C 5.7* 09/27/2012    BNP (last 3 results) No results for input(s): BNP in the last 8760 hours.  ProBNP (last 3 results) No results for input(s): PROBNP in the last 8760 hours.   Other Studies Reviewed Today:  Limited Echo Study Conclusions from 05/2015  -  Procedure narrative: Limited transthoracic echocardiography for left ventricular function evaluation. Image quality was adequate. - Left ventricle: The cavity size was normal. Wall thickness was normal. Systolic function was normal. The estimated ejection fraction was in the range of 50% to 55%. There is inferior and inferoseptal hypokinesis.  Impressions:  - Limited echo for LV function. There is inferior and inferoseptal hypokinesis. LVEF has improved to 50-55%.  Echocardiogram 03/11/2015 LV EF: 30% -  35%  ------------------------------------------------------------------- Indications:   Chest pain 786.51.  ------------------------------------------------------------------- History:  PMH: Large high risk inferior STEMI.  ------------------------------------------------------------------- Study Conclusions  - Left ventricle: The cavity size was normal. Wall thickness was normal. Systolic function was moderately to severely reduced. The estimated ejection fraction was in the range of 30% to 35%. Basal to mid inferior akinesis. Inferoseptal and inferolateral severe hypokinesis. Left ventricular diastolic function parameters were normal. - Aortic valve: There was no stenosis. - Mitral valve: There was no significant regurgitation. - Left atrium: The atrium was mildly dilated. - Right ventricle: The cavity size was normal. Systolic function was mildly reduced. - Right atrium: The atrium  was mildly dilated. - Tricuspid valve: Peak RV-RA gradient (S): 20 mm Hg. - Pulmonary arteries: PA peak pressure: 28 mm Hg (S). - Systemic veins: IVC measured 2.0 cm with < 50% respirophasic variation, suggesting RA pressure 8 mmHg.  Impressions:  - Normal LV size with EF 30-35. Wall motion abnormalities as above, consistent with large inferior MI. The RV was normal in size with mildly decreased systolic function. No significant mitral regurgitation.    Cardiac catheterization 03/10/2015 Conclusion     Prox RCA to Mid RCA lesion, 100% stenosed. There is a 0% residual stenosis post intervention. The lesion was previously treated with a stent (unknown type) .  A drug-eluting stent was placed.  There is mild left ventricular systolic dysfunction.  RPDA lesion, 50% stenosed.  1st RPLB lesion, 40% stenosed.  Dist RCA lesion, 40% stenosed.  High risk acute inferior ST segment elevation myocardial infarction secondary to total proximal occlusion of the RCA immediately proximal to the previously placed stent with TIMI 0 flow in a very large dominant RCA.  Normal left coronary system with a normal LAD and circumflex vessel.  Difficult but successful PCI to the RCA with PTCA, ultimate insertion of a 4.038 mm Resolute DES stent postdilated to 4.41 mm proximally and 4.1 mm distally with restoration of TIMI-3 flow; 40% narrowing beyond the stented segment prior to the acute margin, 50% stenosis in the proximal PDA and 40% stenosis in the distal RCA continuation branch.  RECOMMENDATION:  Recommend DAPT indefinitely and after one year consider changing brilinta to 60 mg twice a day per Pegasus trial data. Smoking cessation is imperative. Aggressive lipid-lowering therapy and treatment with beta blocker, ACE inhibitor and nitrate therapy.               Assessment/Plan: 1. Inferior STEMI due to instent occlusion - s/p PCI with repeat DES placement- on DAPT  - would favor continued use indefinitely. Can reduce Brilinta to 60 mg bid at one year. Back to work without issue. He is doing well clinically. See back in 4 months.   2. Smoking history - he is tapering down. Encourage complete cessation. Will try the patches - I have sent in a RX for hime   3. Ischemic CM - EF was down to 30 to 35% by echo - repeat limited echo shows considerable improvement. He looks good clinically. No change with current regimen.  4. HLD - on statin - recheck labs today.   5. Bipolar disorder.  6. Brief run of PAF - this was in the setting of an acute MI - remains in sinus by exam.  7. Elevated LFTs - rechecking lab today               Current medicines are reviewed with the patient today.  The patient does not have concerns regarding medicines other than what has been noted above.  The following changes have been made:  See above.  Labs/ tests ordered today include:    Orders Placed This Encounter  Procedures  . Basic metabolic panel  . CBC  . Hepatic function panel  . Lipid panel     Disposition:   FU with Dr. Swaziland in 4 months.   Patient is agreeable to this plan and will call if any problems develop in the interim.   Signed: Rosalio Macadamia, RN, ANP-C 06/09/2015 10:48 AM  [facility] [address] [place], [place]  [address] Phone: [phone] Fax: [phone]

## 2015-06-10 ENCOUNTER — Telehealth: Payer: Self-pay | Admitting: Nurse Practitioner

## 2015-06-10 NOTE — Telephone Encounter (Signed)
Forward to VanceLori and W[employer] Resourcesdanielle

## 2015-06-10 NOTE — Telephone Encounter (Signed)
° ° °  Per [facility]   CBC can not be ran on this pt..they called in to report.

## 2015-06-11 ENCOUNTER — Encounter (HOSPITAL_COMMUNITY): Payer: BLUE CROSS/BLUE SHIELD

## 2015-06-14 ENCOUNTER — Encounter (HOSPITAL_COMMUNITY): Payer: BLUE CROSS/BLUE SHIELD

## 2015-06-16 ENCOUNTER — Encounter (HOSPITAL_COMMUNITY): Payer: BLUE CROSS/BLUE SHIELD

## 2015-06-18 ENCOUNTER — Encounter (HOSPITAL_COMMUNITY): Payer: BLUE CROSS/BLUE SHIELD

## 2015-06-21 ENCOUNTER — Encounter (HOSPITAL_COMMUNITY): Payer: BLUE CROSS/BLUE SHIELD

## 2015-06-23 ENCOUNTER — Encounter (HOSPITAL_COMMUNITY): Payer: BLUE CROSS/BLUE SHIELD

## 2015-06-25 ENCOUNTER — Encounter (HOSPITAL_COMMUNITY): Payer: BLUE CROSS/BLUE SHIELD

## 2015-06-28 ENCOUNTER — Encounter (HOSPITAL_COMMUNITY): Payer: BLUE CROSS/BLUE SHIELD

## 2015-06-30 ENCOUNTER — Encounter (HOSPITAL_COMMUNITY): Payer: BLUE CROSS/BLUE SHIELD

## 2015-07-02 ENCOUNTER — Encounter (HOSPITAL_COMMUNITY): Payer: BLUE CROSS/BLUE SHIELD

## 2015-07-05 ENCOUNTER — Encounter (HOSPITAL_COMMUNITY): Payer: BLUE CROSS/BLUE SHIELD

## 2015-07-07 ENCOUNTER — Encounter (HOSPITAL_COMMUNITY): Payer: BLUE CROSS/BLUE SHIELD

## 2015-07-12 ENCOUNTER — Encounter (HOSPITAL_COMMUNITY): Payer: BLUE CROSS/BLUE SHIELD

## 2015-07-14 ENCOUNTER — Telehealth: Payer: Self-pay | Admitting: Nurse Practitioner

## 2015-07-14 ENCOUNTER — Encounter (HOSPITAL_COMMUNITY): Payer: BLUE CROSS/BLUE SHIELD

## 2015-07-14 MED ORDER — ATORVASTATIN CALCIUM 80 MG PO TABS: 80.0000 mg | ORAL_TABLET | Freq: Every day | ORAL | Status: DC

## 2015-07-14 NOTE — Telephone Encounter (Signed)
Returned call to patient.He stated he would like to try Cialis for ED.Stated he has took Viagra in the past with no results.Message sent to Dr.Jordan.

## 2015-07-14 NOTE — Telephone Encounter (Signed)
The pt's primary cardiologist is Dr SwazilandJordan. I will forward this message to the [facility] office.

## 2015-07-14 NOTE — Telephone Encounter (Signed)
New problem    Pt need to speak to nurse concerning Erectile Dysfunction. Please call pt.

## 2015-07-14 NOTE — Telephone Encounter (Signed)
°*  STAT* If patient is at the pharmacy, call can be transferred to refill team.   1. Which medications need to be refilled? (please list name of each medication and dose if known) Atorvastatin 80mg   2. Which pharmacy/location (including street and city if local pharmacy) is medication to be sent to? [facility]  3. Do they need a 30 day or 90 day supply? 90day

## 2015-07-15 MED ORDER — TADALAFIL 10 MG PO TABS: 10.0000 mg | ORAL_TABLET | Freq: Every day | ORAL | Status: DC | PRN

## 2015-07-15 NOTE — Telephone Encounter (Signed)
He is OK to take Cialis. Recommend 10 mg daily prn  Remmie Bembenek SwazilandJordan MD, [facility]

## 2015-07-15 NOTE — Addendum Note (Signed)
Addended by: Meda KlinefelterPUGH, Ferry Matthis JOHNSON D on: 07/15/2015 03:19 PM   Modules accepted: Orders

## 2015-07-15 NOTE — Telephone Encounter (Signed)
Returned call to patient.Dr.Jodan advised ok to take Cialis 10 mg daily as needed.Advised not to take NTG within 24 hrs of taking Cialis.Prescription sent to pharmacy.

## 2015-07-16 ENCOUNTER — Encounter (HOSPITAL_COMMUNITY): Payer: BLUE CROSS/BLUE SHIELD

## 2015-07-19 ENCOUNTER — Encounter (HOSPITAL_COMMUNITY): Payer: BLUE CROSS/BLUE SHIELD

## 2015-07-19 ENCOUNTER — Telehealth: Payer: Self-pay

## 2015-07-19 NOTE — Telephone Encounter (Signed)
Prior auth for Cialis 10mg  sent to [employer].

## 2015-07-21 ENCOUNTER — Encounter (HOSPITAL_COMMUNITY): Payer: BLUE CROSS/BLUE SHIELD

## 2015-07-23 ENCOUNTER — Encounter (HOSPITAL_COMMUNITY): Payer: BLUE CROSS/BLUE SHIELD

## 2015-07-26 ENCOUNTER — Encounter (HOSPITAL_COMMUNITY): Payer: BLUE CROSS/BLUE SHIELD

## 2015-07-27 ENCOUNTER — Telehealth: Payer: Self-pay

## 2015-07-27 NOTE — Telephone Encounter (Signed)
Prior auth for Cialis 10mg  [facility]

## 2015-07-28 ENCOUNTER — Encounter (HOSPITAL_COMMUNITY): Payer: BLUE CROSS/BLUE SHIELD

## 2015-07-29 ENCOUNTER — Telehealth: Payer: Self-pay

## 2015-07-29 NOTE — Telephone Encounter (Signed)
Cialis 10mg  approved by [employer]. Pharmacy notified.

## 2015-07-30 ENCOUNTER — Encounter (HOSPITAL_COMMUNITY): Payer: BLUE CROSS/BLUE SHIELD

## 2015-08-02 ENCOUNTER — Encounter (HOSPITAL_COMMUNITY): Payer: BLUE CROSS/BLUE SHIELD

## 2015-08-03 ENCOUNTER — Encounter: Payer: Self-pay | Admitting: Physician Assistant

## 2015-08-03 ENCOUNTER — Ambulatory Visit (INDEPENDENT_AMBULATORY_CARE_PROVIDER_SITE_OTHER): Payer: BLUE CROSS/BLUE SHIELD | Admitting: Physician Assistant

## 2015-08-03 ENCOUNTER — Ambulatory Visit: Payer: Managed Care, Other (non HMO) | Admitting: Physician Assistant

## 2015-08-03 VITALS — BP 126/88 | HR 45 | Temp 98.2°F | Resp 16 | Ht 68.25 in | Wt 156.4 lb

## 2015-08-03 DIAGNOSIS — Z1329 Encounter for screening for other suspected endocrine disorder: Secondary | ICD-10-CM | POA: Diagnosis not present

## 2015-08-03 DIAGNOSIS — Z125 Encounter for screening for malignant neoplasm of prostate: Secondary | ICD-10-CM | POA: Diagnosis not present

## 2015-08-03 DIAGNOSIS — Z130 Encounter for screening for diseases of the blood and blood-forming organs and certain disorders involving the immune mechanism: Secondary | ICD-10-CM | POA: Diagnosis not present

## 2015-08-03 DIAGNOSIS — Z13228 Encounter for screening for other metabolic disorders: Secondary | ICD-10-CM

## 2015-08-03 DIAGNOSIS — Z0000 Encounter for general adult medical examination without abnormal findings: Secondary | ICD-10-CM

## 2015-08-03 DIAGNOSIS — Z23 Encounter for immunization: Secondary | ICD-10-CM

## 2015-08-03 LAB — CBC
HEMATOCRIT: 43.2 % (ref 39.0–52.0)
Hemoglobin: 14.6 g/dL (ref 13.0–17.0)
MCH: 27.1 pg (ref 26.0–34.0)
MCHC: 33.8 g/dL (ref 30.0–36.0)
MCV: 80.1 fL (ref 78.0–100.0)
MPV: 8.6 fL (ref 8.6–12.4)
PLATELETS: 247 10*3/uL (ref 150–400)
RBC: 5.39 MIL/uL (ref 4.22–5.81)
RDW: 14.2 % (ref 11.5–15.5)
WBC: 9.2 10*3/uL (ref 4.0–10.5)

## 2015-08-03 LAB — COMPLETE METABOLIC PANEL WITH GFR
ALBUMIN: 4.4 g/dL (ref 3.6–5.1)
ALT: 17 U/L (ref 9–46)
AST: 18 U/L (ref 10–35)
Alkaline Phosphatase: 73 U/L (ref 40–115)
BILIRUBIN TOTAL: 0.4 mg/dL (ref 0.2–1.2)
BUN: 20 mg/dL (ref 7–25)
CALCIUM: 9.4 mg/dL (ref 8.6–10.3)
CO2: 25 mmol/L (ref 20–31)
CREATININE: 1.25 mg/dL (ref 0.70–1.25)
Chloride: 103 mmol/L (ref 98–110)
GFR, EST AFRICAN AMERICAN: 71 mL/min (ref >=60)
GFR, Est Non African American: 62 mL/min (ref >=60)
Glucose, Bld: 91 mg/dL (ref 65–99)
Potassium: 4.2 mmol/L (ref 3.5–5.3)
Sodium: 140 mmol/L (ref 135–146)
Total Protein: 6.8 g/dL (ref 6.1–8.1)

## 2015-08-03 MED ORDER — ZOSTER VACCINE LIVE 19400 UNT/0.65ML ~~LOC~~ SOLR: 0.6500 mL | Freq: Once | SUBCUTANEOUS | Status: DC

## 2015-08-03 NOTE — Patient Instructions (Signed)
Please schedule appointment with ophthalmologist, see your dermatologist, and consider having the colonoscopy.  You are due.   Listed below are general physical exam information.   I will have your results within the next 7-10 days. Sounds like you are doing an awesome job, by way of diet, and trying to get in some exercise.  Keep up the good work.

## 2015-08-03 NOTE — Progress Notes (Signed)
[facility] [address], [place] [address] [phone]  Date:  08/03/2015   Name:  Jason Maldonado   DOB:  June 09, 1956   MRN:  [id number]  PCP:  Lucilla Edin, MD    History of Present Illness:  Jason Maldonado is a 59 y.o. male patient who presents to [facility] for annual physical exam. He has no present concerns.   Diet: low fat.  Drinking plenty of water  Bm: no blood in the stool, constipation, or diarrhea  Urination: normal, dysuria, frequency, hematuria  Sleep: sleeping thorugh the night, getting enough sleep Bipolar disorder: followed by [facility], which he states is working.  He feels very functional and happy with the cymbalta   Social activity: not much.  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  [employer].   09/2012 03/10/2015.  effient with 1 year .  5 cigarettes per day.   Exercising couple days per week.    Recent MI 5 months ago--reports no present concerns.    Patient Active Problem List   Diagnosis Date Noted  . Chronic systolic CHF (congestive heart failure) (HCC) 04/27/2015  . Acute ST elevation myocardial infarction (STEMI) involving other coronary artery of inferior wall (HCC) 03/10/2015  . PAF (paroxysmal atrial fibrillation) (HCC) 03/10/2015  . ST elevation myocardial infarction (STEMI) involving right coronary artery in recovery phase (HCC) 03/10/2015  . Acute MI, inferior wall (HCC) 03/10/2015  . LV dysfunction   . Cardiogenic shock (HCC) 10/01/2012  . ST elevation myocardial infarction (STEMI) of inferior wall (HCC) 09/27/2012  . Hyperlipidemia 09/27/2012  . Tobacco abuse 09/27/2012  . CAD (coronary artery disease) 09/27/2012  . Bipolar 1 disorder [facility])     Past Medical History  Diagnosis Date  . Bipolar 1 disorder (HCC)   . Tobacco abuse   . PAF (paroxysmal atrial fibrillation) (HCC)     a. noted on admission 02/2015 in setting of inf stemi;  b. CHA2DS2VASc = 1.  Marland Kitchen CAD (coronary artery disease)      a. STEMI 09/2012  single vessel occlusive CAD of the RCA s/p DES to mid RCA, EF 45%;  b. inferior STEMI 03/10/2015 DES to prox and mid RCA  . LV dysfunction     a. 09/2012 Cath LV gram severe basal to midinferior wall hypokinesis, EF 45% and echo EF 55%.  Marland Kitchen Hyperlipidemia   . Depression     Past Surgical History  Procedure Laterality Date  . Left heart catheterization with coronary angiogram N/A 09/27/2012    Procedure: LEFT HEART CATHETERIZATION WITH CORONARY ANGIOGRAM;  Surgeon: [name], MD;  Location: [facility] CATH LAB;  Service: Cardiovascular;  Laterality: N/A;  . Cardiac catheterization N/A 03/10/2015    Procedure: Left Heart Cath and Coronary Angiography;  Surgeon: Lennette Bihari, MD;  Location: [facility] INVASIVE CV LAB;  Service: Cardiovascular;  Laterality: N/A;  . Cardiac catheterization  03/10/2015    Procedure: Coronary Stent Intervention;  Surgeon: Lennette Bihari, MD;  Location: [facility] INVASIVE CV LAB;  Service: Cardiovascular;;    Social History  Substance Use Topics  . Smoking status: Current Every Day Smoker -- 1.00 packs/day for 40 years    Types: Cigarettes    Last Attempt to Quit: 04/15/2015  . Smokeless tobacco: None     Comment: using nicotine gum  . Alcohol Use: No    Family History  Problem Relation Age of Onset  . CAD Father     deceased.  No Known Allergies  Medication list has been reviewed and updated.  Current Outpatient Prescriptions on File Prior to Visit  Medication Sig Dispense Refill  . ALPRAZolam (XANAX) 0.5 MG tablet TAKE 1/2 TO 1 TABLET BY MOUTH EVERY 8 HOURS AS NEEDED FOR ANXIETY  0  . aspirin EC 81 MG EC tablet Take 1 tablet (81 mg total) by mouth daily.    Marland Kitchen. atorvastatin (LIPITOR) 80 MG tablet Take 1 tablet (80 mg total) by mouth at bedtime. 90 tablet 3  . carvedilol (COREG) 3.125 MG tablet Take 1 tablet (3.125 mg total) by mouth 2 (two) times daily. 180 tablet 3  . DULoxetine (CYMBALTA) 60 MG capsule Take 120 mg by mouth daily. (120 mg ) two  tablets in the am  2  . losartan (COZAAR) 25 MG tablet Take 0.5 tablets (12.5 mg total) by mouth daily. 30 tablet 3  . nitroGLYCERIN (NITROSTAT) 0.4 MG SL tablet Place 1 tablet (0.4 mg total) under the tongue every 5 (five) minutes as needed for chest pain (up to 3 doses). 25 tablet 3  . ticagrelor (BRILINTA) 90 MG TABS tablet Take 1 tablet (90 mg total) by mouth 2 (two) times daily. 180 tablet 3  . nicotine (NICODERM CQ) 21 mg/24hr patch Place 1 patch (21 mg total) onto the skin daily. (Patient not taking: Reported on 08/03/2015) 28 patch 0  . tadalafil (CIALIS) 10 MG tablet Take 1 tablet (10 mg total) by mouth daily as needed for erectile dysfunction. (Patient not taking: Reported on 08/03/2015) 10 tablet 3   No current facility-administered medications on file prior to visit.    Review of Systems  Constitutional: Negative for fever and chills.  HENT: Negative for ear discharge, ear pain and sore throat.   Eyes: Negative for blurred vision and double vision.  Respiratory: Negative for cough, shortness of breath and wheezing.   Cardiovascular: Negative for chest pain, palpitations and leg swelling.  Gastrointestinal: Negative for nausea, vomiting and diarrhea.  Genitourinary: Negative for dysuria, frequency and hematuria.  Skin: Negative for itching and rash.  Neurological: Negative for dizziness and headaches.     Physical Examination: BP 126/88 mmHg  Pulse 45  Temp(Src) 98.2 F (36.8 C) (Oral)  Resp 16  Ht 5' 8.25" (1.734 m)  Wt 156 lb 6.4 oz (70.943 kg)  BMI 23.59 kg/m2  SpO2 98% Ideal Body Weight: Weight in (lb) to have BMI = 25: 165.3  Physical Exam  Constitutional: He is oriented to person, place, and time. He appears well-developed and well-nourished. No distress.  HENT:  Head: Normocephalic and atraumatic.  Right Ear: Tympanic membrane, external ear and ear canal normal.  Left Ear: Tympanic membrane, external ear and ear canal normal.  Mouth/Throat: No oropharyngeal  exudate, posterior oropharyngeal edema or posterior oropharyngeal erythema.  Eyes: Conjunctivae and EOM are normal. Pupils are equal, round, and reactive to light. Right conjunctiva is not injected. Left conjunctiva is not injected. No scleral icterus. Right eye exhibits normal extraocular motion. Left eye exhibits normal extraocular motion.  Cardiovascular: Normal rate and regular rhythm.  Exam reveals no friction rub.   No murmur heard. Pulmonary/Chest: Effort normal. No respiratory distress. He has no wheezes.  Abdominal: Soft. Bowel sounds are normal. He exhibits no distension and no mass. There is no tenderness.  Musculoskeletal: Normal range of motion. He exhibits no edema or tenderness.  Neurological: He is alert and oriented to person, place, and time. He displays normal reflexes.  Skin: Skin is warm and dry. He  is not diaphoretic.  Above sternum, a nevus that has darkened spots along the lesion.  Still consistently rounded and raised without ulceration.  There is some erythema surrounding the lesion.     Psychiatric: He has a normal mood and affect. His behavior is normal.   --Patient has many moles and angiomas along the body (mainly trunk).  Sternum mole--states that he rubs and pulls at a lot.  Non-pruritic or painful.--   Visual Acuity Screening   Right eye Left eye Both eyes  Without correction:  With correction:       Assessment and Plan: Jason Maldonado is a 59 y.o. male who is here today for physical exam. Advised patient that he is due colonoscopy.  He is very reluctant, and does not wish for me to place a referral.  I discussed the benefit of procedure.  He states that he will set this up individually and does not want to be told what he is going to do.   Discussed vision changes.  He states that he knows he needs to see opthalmologist--he will schedule appt on his own. Though this the change in nevus is likely secondary to his manipulation.  Dermatology  visit is recommended.  Patient states that he will contact his dermatology contact that he should be seeing (in [place]??).  He declines referral as well. CAD followed by heartcare--next visit with [place] in February. Given shingles vaccine to obtain at pharmacy  Annual physical exam - Plan: COMPLETE METABOLIC PANEL WITH GFR, CBC, PSA, TSH  Screening for deficiency anemia - Plan: COMPLETE METABOLIC PANEL WITH GFR, CBC  Screening for prostate cancer - Plan: COMPLETE METABOLIC PANEL WITH GFR, PSA  Screening for thyroid disorder - Plan: COMPLETE METABOLIC PANEL WITH GFR, TSH  Screening for metabolic disorder - Plan: COMPLETE METABOLIC PANEL WITH GFR  Need for shingles vaccine - Plan: zoster vaccine live, PF, (ZOSTAVAX) 69629 UNT/0.65ML injection  Trena Platt, PA-C [facility] 08/03/2015 4:44 PM

## 2015-08-03 NOTE — Progress Notes (Signed)
   Subjective:    Patient ID: Jason Maldonado, male    DOB: [birth year], 59 y.o.   MRN: [id number]  HPI    Review of Systems  Constitutional: Negative.   HENT: Negative.   Eyes: Negative.   Respiratory: Negative.   Cardiovascular: Negative.   Gastrointestinal: Negative.   Endocrine: Negative.   Genitourinary: Negative.   Musculoskeletal: Negative.   Skin: Negative.   Allergic/Immunologic: Negative.   Neurological: Negative.   Hematological: Negative.   Psychiatric/Behavioral: Negative.        Objective:   Physical Exam        Assessment & Plan:

## 2015-08-04 ENCOUNTER — Encounter (HOSPITAL_COMMUNITY): Payer: BLUE CROSS/BLUE SHIELD

## 2015-08-04 LAB — PSA: PSA: 1.3 ng/mL (ref <=4.00)

## 2015-08-04 LAB — TSH: TSH: 1.565 u[IU]/mL (ref 0.350–4.500)

## 2015-08-06 ENCOUNTER — Encounter (HOSPITAL_COMMUNITY): Payer: BLUE CROSS/BLUE SHIELD

## 2015-10-01 ENCOUNTER — Ambulatory Visit (INDEPENDENT_AMBULATORY_CARE_PROVIDER_SITE_OTHER): Payer: BLUE CROSS/BLUE SHIELD | Admitting: Cardiology

## 2015-10-01 ENCOUNTER — Encounter: Payer: Self-pay | Admitting: Cardiology

## 2015-10-01 VITALS — BP 118/78 | HR 84 | Ht 67.0 in | Wt 154.4 lb

## 2015-10-01 DIAGNOSIS — Z720 Tobacco use: Secondary | ICD-10-CM | POA: Diagnosis not present

## 2015-10-01 DIAGNOSIS — I5022 Chronic systolic (congestive) heart failure: Secondary | ICD-10-CM | POA: Diagnosis not present

## 2015-10-01 DIAGNOSIS — I2510 Atherosclerotic heart disease of native coronary artery without angina pectoris: Secondary | ICD-10-CM

## 2015-10-01 NOTE — Patient Instructions (Addendum)
Stop taking losartan   Continue your other therapy  Quit smoking  I will see you in 6 months.

## 2015-10-01 NOTE — Progress Notes (Signed)
CARDIOLOGY OFFICE NOTE  Date:  10/01/2015    [name] Date of Birth: 1956/07/18 Medical Record #[id number]  PCP:  Trena Platt, PA  Cardiologist:  [place]    Chief Complaint  Patient presents with  . Follow-up    4 month:  No complaints of chest pain, SOB, edema, dizziness or leg pain. Has not started using the nicotine patches.    History of Present Illness: Jason Maldonado is a 60 y.o. male who is seen for follow up CAD.  He has a past medical history of hyperlipidemia, bipolar disorder, tobacco abuse, and a history of CAD status post previous inferior MI in 2014 with PCI to the RCA.   He presented to [facility] on 03/10/2015 as a inferior STEMI in the setting of RCA in-stent restenosis/occlusion.   Cardiac catheterization on 03/10/2015 showed 100% stenosis of prox and mid RCA prox to a previously placed unknown stent, 4.038 mm Resolute DES stent postdilated to 4.41 mm proximally and 4.1 mm distally. 50% residual RPDA, 40% 1st RPLB, 40%distal RCA lesion. Post cath, he was placed on aspirin and Brilinta. Echo EF 30-35%, basal to mid inferior akinesis, inferoseptal and inferolateral severe hypokinesis. He did have refractory hypotension that gradually improved.   On follow up today he is feeling well. He is still smoking 1/2 pk/day. Can't seem to quit- attributes this to stress and triggers at work. He denies any chest pain, SOB, palpitations, or edema. Energy level is good.    Past Medical History  Diagnosis Date  . Bipolar 1 disorder (HCC)   . Tobacco abuse   . PAF (paroxysmal atrial fibrillation) (HCC)     a. noted on admission 02/2015 in setting of inf stemi;  b. CHA2DS2VASc = 1.  Marland Kitchen CAD (coronary artery disease)     a. STEMI 09/2012  single vessel occlusive CAD of the RCA s/p DES to mid RCA, EF 45%;  b. inferior STEMI 03/10/2015 DES to prox and mid RCA  . LV dysfunction     a. 09/2012 Cath LV gram severe basal to midinferior wall hypokinesis, EF 45% and  echo EF 55%.  Marland Kitchen Hyperlipidemia   . Depression     Past Surgical History  Procedure Laterality Date  . Left heart catheterization with coronary angiogram N/A 09/27/2012    Procedure: LEFT HEART CATHETERIZATION WITH CORONARY ANGIOGRAM;  Surgeon: Ramiel Forti M Swaziland, MD;  Location: [facility] CATH LAB;  Service: Cardiovascular;  Laterality: N/A;  . Cardiac catheterization N/A 03/10/2015    Procedure: Left Heart Cath and Coronary Angiography;  Surgeon: Lennette Bihari, MD;  Location: [facility] INVASIVE CV LAB;  Service: Cardiovascular;  Laterality: N/A;  . Cardiac catheterization  03/10/2015    Procedure: Coronary Stent Intervention;  Surgeon: Lennette Bihari, MD;  Location: [facility] INVASIVE CV LAB;  Service: Cardiovascular;;     Medications: Current Outpatient Prescriptions  Medication Sig Dispense Refill  . ALPRAZolam (XANAX) 0.5 MG tablet TAKE 1/2 TO 1 TABLET BY MOUTH EVERY 8 HOURS AS NEEDED FOR ANXIETY  0  . aspirin EC 81 MG EC tablet Take 1 tablet (81 mg total) by mouth daily.    Marland Kitchen atorvastatin (LIPITOR) 80 MG tablet Take 1 tablet (80 mg total) by mouth at bedtime. 90 tablet 3  . carvedilol (COREG) 3.125 MG tablet Take 1 tablet (3.125 mg total) by mouth 2 (two) times daily. 180 tablet 3  . DULoxetine (CYMBALTA) 60 MG capsule Take 120 mg by mouth daily. (120 mg ) two  tablets in the am  2  . nitroGLYCERIN (NITROSTAT) 0.4 MG SL tablet Place 1 tablet (0.4 mg total) under the tongue every 5 (five) minutes as needed for chest pain (up to 3 doses). 25 tablet 3  . tadalafil (CIALIS) 10 MG tablet Take 1 tablet (10 mg total) by mouth daily as needed for erectile dysfunction. 10 tablet 3  . ticagrelor (BRILINTA) 90 MG TABS tablet Take 1 tablet (90 mg total) by mouth 2 (two) times daily. 180 tablet 3   No current facility-administered medications for this visit.    Allergies: No Known Allergies  Social History: The patient  reports that he has been smoking Cigarettes.  He has a 40 pack-year smoking history. He does not  have any smokeless tobacco history on file. He reports that he does not drink alcohol or use illicit drugs.   Family History: The patient's family history includes CAD in his father.   Review of Systems: Please see the history of present illness.    All other systems are reviewed and negative.   Physical Exam: VS:  BP 118/78 mmHg  Pulse 84  Ht  (1.702 m)  Wt 70.035 kg (154 lb 6.4 oz)  BMI 24.18 kg/m2 .  BMI Body mass index is 24.18 kg/(m^2).  Wt Readings from Last 3 Encounters:  10/01/15 70.035 kg (154 lb 6.4 oz)  08/03/15 70.943 kg (156 lb 6.4 oz)  06/09/15 68.493 kg (151 lb)    General: Pleasant. Well developed, well nourished and in no acute distress. Color is chronically sallow.  HEENT: Normal. Neck: Supple, no JVD, carotid bruits, or masses noted.  Cardiac: Regular rate and rhythm. +S3. No edema.  Respiratory:  Lungs are clear to auscultation bilaterally with normal work of breathing.  GI: Soft and nontender.  MS: No deformity or atrophy. Gait and ROM intact. Skin: Warm and dry. Color is normal.  Neuro:  Strength and sensation are intact and no gross focal deficits noted.  Psych: Alert, appropriate and with normal affect.   LABORATORY DATA:  EKG:  EKG is not ordered today.   Lab Results  Component Value Date   WBC 9.2 08/03/2015   HGB 14.6 08/03/2015   HCT 43.2 08/03/2015   PLT 247 08/03/2015   GLUCOSE 91 08/03/2015   CHOL 150 06/09/2015   TRIG 78 06/09/2015   HDL 50 06/09/2015   LDLCALC 84 06/09/2015   ALT 17 08/03/2015   AST 18 08/03/2015   NA 140 08/03/2015   K 4.2 08/03/2015   CL 103 08/03/2015   CREATININE 1.25 08/03/2015   BUN 20 08/03/2015   CO2 25 08/03/2015   TSH 1.565 08/03/2015   PSA 1.30 08/03/2015   INR 1.18 09/27/2012   HGBA1C 5.7* 09/27/2012    BNP (last 3 results) No results for input(s): BNP in the last 8760 hours.  ProBNP (last 3 results) No results for input(s): PROBNP in the last 8760 hours.   Other Studies Reviewed  Today: Echo: 06/08/15:Study Conclusions  - Procedure narrative: Limited transthoracic echocardiography for left ventricular function evaluation. Image quality was adequate. - Left ventricle: The cavity size was normal. Wall thickness was normal. Systolic function was normal. The estimated ejection fraction was in the range of 50% to 55%. There is inferior and inferoseptal hypokinesis.  Impressions:  - Limited echo for LV function. There is inferior and inferoseptal hypokinesis. LVEF has improved to 50-55%.  Assessment/Plan: 1. CAD s/p inferior MI with stenting in 2014. Recurrent STEMI in July 2016 with  instent restenosis/occlusion. S/p repeat DES. On DAPT. Would favor continuing this long term. Plan to reduce Brilinta to 60 mg bid in July. Continue beta blocker. Encourage exercise and smoking cessation.  2. Tobacco abuse. Encourage efforts at smoking cessation.  3. Ischemic cardiomyopathy in setting of acute MI. EF has recovered. Will stop losartan at this point.  4. Hyperlipidemia. Controlled on statin.   5. Bipolar disorder  I will follow up in 6 months.  Current medicines are reviewed with the patient today.  The patient does not have concerns regarding medicines other than what has been noted above.  The following changes have been made:  See above.  Labs/ tests ordered today include:    No orders of the defined types were placed in this encounter.     Disposition:   FU with me in 6 months.   Patient is agreeable to this plan and will call if any problems develop in the interim.   Signed: Yameli Delamater Sw[place] MD, [facility]    10/01/2015 1:08 PM

## 2015-11-22 ENCOUNTER — Other Ambulatory Visit: Payer: Self-pay | Admitting: Nurse Practitioner

## 2015-11-23 NOTE — Telephone Encounter (Signed)
REFILL 

## 2016-01-04 DIAGNOSIS — F3175 Bipolar disorder, in partial remission, most recent episode depressed: Secondary | ICD-10-CM | POA: Diagnosis not present

## 2016-02-22 DIAGNOSIS — F3175 Bipolar disorder, in partial remission, most recent episode depressed: Secondary | ICD-10-CM | POA: Diagnosis not present

## 2016-03-01 ENCOUNTER — Telehealth: Payer: Self-pay | Admitting: Cardiology

## 2016-03-01 DIAGNOSIS — Z79899 Other long term (current) drug therapy: Secondary | ICD-10-CM

## 2016-03-01 DIAGNOSIS — E785 Hyperlipidemia, unspecified: Secondary | ICD-10-CM

## 2016-03-01 MED ORDER — TICAGRELOR 60 MG PO TABS: 60.0000 mg | ORAL_TABLET | Freq: Two times a day (BID) | ORAL | Status: DC

## 2016-03-01 MED ORDER — CARVEDILOL 3.125 MG PO TABS: 3.1250 mg | ORAL_TABLET | Freq: Two times a day (BID) | ORAL | Status: DC

## 2016-03-01 NOTE — Telephone Encounter (Signed)
Patient notified of MD advice. Voiced understanding. Rx(s) sent to pharmacy electronically. Patient will have fasting labs done most likely this week.

## 2016-03-01 NOTE — Telephone Encounter (Signed)
Patient called in as he wants an appointment w/Dr. SwazilandJordan He states he is due for an appointment in August - cannot be seen til October  Patient wants to know if his Brilinta dose should be reduced (from 90 to 60mg  BID)? -- he is needing a refill and doesn't want to have a refill on the 90mg  dose if Dr. Sw[place]ilandJordan wants to change it  Patient has no edema, no chest pain, no shortness of breath - a little dizziness  Patient also stopped atorvastatin 80mg  about 6-8 weeks ago -- patient takes an herbal formula for cholesterol ([name] - formulated by Dr. Emilie RutterSam Robbins - takes 2 BID) -- patient wants to know if he needs labs done  Message routed to Dr. SwazilandJordan & Cheryl, LPN

## 2016-03-01 NOTE — Telephone Encounter (Signed)
Please reduce Brilinta to 60 mg bid.   We should check a CMET and lipid panel on this new supplement to see if it is working.  Wyman Meschke [place] MD, [facility]

## 2016-03-01 NOTE — Telephone Encounter (Signed)
New Message  Pt calling to speak w/ RN about adjusting his Brillenta. Please call back and discuss.

## 2016-03-01 NOTE — Addendum Note (Signed)
Addended by: Lindell SparELKINS, JENNA M on: 03/01/2016 03:38 PM   Modules accepted: Orders, Medications

## 2016-03-06 DIAGNOSIS — E785 Hyperlipidemia, unspecified: Secondary | ICD-10-CM | POA: Diagnosis not present

## 2016-03-07 LAB — LIPID PANEL
Cholesterol: 225 mg/dL — ABNORMAL HIGH (ref 125–200)
HDL: 52 mg/dL (ref >=40)
LDL CALC: 157 mg/dL — AB (ref <130)
Total CHOL/HDL Ratio: 4.3 Ratio (ref <=5.0)
Triglycerides: 79 mg/dL (ref <150)
VLDL: 16 mg/dL (ref <30)

## 2016-03-17 ENCOUNTER — Other Ambulatory Visit: Payer: Self-pay

## 2016-03-17 DIAGNOSIS — E785 Hyperlipidemia, unspecified: Secondary | ICD-10-CM

## 2016-03-17 MED ORDER — ROSUVASTATIN CALCIUM 5 MG PO TABS: 5.0000 mg | ORAL_TABLET | Freq: Every day | ORAL | 6 refills | Status: DC

## 2016-04-03 ENCOUNTER — Telehealth: Payer: Self-pay | Admitting: Cardiology

## 2016-04-03 NOTE — Telephone Encounter (Signed)
Could be a combination of both medications.  Really needs to be on statin for his cardiac history.  Would suggest he stop the crestor for about 2 weeks.   By then any side effects of the wellbutrin should have worked themselves out.  Restart Crestor in 2 weeks and see if any symptoms return.

## 2016-04-03 NOTE — Telephone Encounter (Signed)
Returned patient call-patient c/o joint and muscle pain in BL shoulders and lower back x 2 weeks.  States the pain started in his knees, thighs, and hips, also experiencing fatigue, nausea and sweating.  Reports pain is there all the time.  Patient reports 2 recent medication changes and is concerned that they are causing his pain, unsure which one.  Patient was started on Crestor 8/5 and changed from Cymbalta to Wellbutrin on 7/23 by Psychiatrist.  Hx: CAD and HLD.  Reports intolerance to Lipitor as well in the past-d/t muscle aches.    Will route to clinical pharmacist for advice.

## 2016-04-03 NOTE — Telephone Encounter (Signed)
New message     Pt states he is experiencing chronic muscle pain. He would like advise as to what is causing it. He thinks the change in medication from Lipitor to Crestor may be part of the problem. Please advise.

## 2016-04-03 NOTE — Telephone Encounter (Signed)
Returned patient call-made aware of pharmacist recommendations to stop Crestor x 2 weeks, then restart to see if symptoms return.  Pt also states he will update his psychiatrist regarding the Wellbutrin.  Pt advised to call back in 2 weeks for follow up.    Pt verbalized understanding.  Advised to call with further questions/concerns.

## 2016-05-09 ENCOUNTER — Ambulatory Visit (INDEPENDENT_AMBULATORY_CARE_PROVIDER_SITE_OTHER): Payer: BLUE CROSS/BLUE SHIELD | Admitting: Family Medicine

## 2016-05-09 ENCOUNTER — Ambulatory Visit (INDEPENDENT_AMBULATORY_CARE_PROVIDER_SITE_OTHER): Payer: BLUE CROSS/BLUE SHIELD

## 2016-05-09 ENCOUNTER — Ambulatory Visit: Payer: BLUE CROSS/BLUE SHIELD

## 2016-05-09 VITALS — BP 144/95 | HR 76 | Temp 98.5°F | Resp 17 | Ht 67.5 in | Wt 157.0 lb

## 2016-05-09 DIAGNOSIS — M7580 Other shoulder lesions, unspecified shoulder: Secondary | ICD-10-CM

## 2016-05-09 DIAGNOSIS — I2510 Atherosclerotic heart disease of native coronary artery without angina pectoris: Secondary | ICD-10-CM | POA: Diagnosis not present

## 2016-05-09 DIAGNOSIS — M25511 Pain in right shoulder: Secondary | ICD-10-CM

## 2016-05-09 DIAGNOSIS — M25512 Pain in left shoulder: Secondary | ICD-10-CM

## 2016-05-09 NOTE — Patient Instructions (Signed)
     IF you received an x-ray today, you will receive an invoice from [facility]. Please contact [facility] at [phone] with questions or concerns regarding your invoice.   IF you received labwork today, you will receive an invoice from [facility]. Please contact [facility] at [phone] with questions or concerns regarding your invoice.   Our billing staff will not be able to assist you with questions regarding bills from these companies.  You will be contacted with the lab results as soon as they are available. The fastest way to get your results is to activate your My Chart account. Instructions are located on the last page of this paperwork. If you have not heard from us regarding the results in 2 weeks, please contact this office.      

## 2016-05-09 NOTE — Progress Notes (Signed)
  Jason KochMichael J Maldonado - 60 y.o. male MRN [id number]  Date of birth: [birth year]  SUBJECTIVE:  Including CC & ROS.  CC: bilateral shoulder pain Complains of bilateral shoulder pain that has been ongoing for past 6-8 weeks.  He works doing a Lexicographerlot of overhead activity.   He states that it came on after starting a statin, but progressed from legs to shoulders.  His leg pain resolved, but now he has pain in his shoulders.  He is no longer taking his statin x a few weeks.  Denies numbness or tingling.  Pain on lateral and posterior shoulder.  Denies weakness.  He has tried NSAIDs with temporary relief.     ROS: No unexpected weight loss, fever, chills, swelling, instability, muscle pain, numbness/tingling, redness, otherwise see HPI   PMHx - Updated and reviewed.  Contributory factors include: CAD with stents, bipolar, CHF PSHx - Updated and reviewed.  Contributory factors include:  Negative FHx - Updated and reviewed.  Contributory factors include:  Noncontributory Social Hx - Updated and reviewed. Contributory factors include: +smoking history Medications - reviewed   DATA REVIEWED: Previous office visits, shoulder XR that were without arthritis  PHYSICAL EXAM:  VS: BP:(!) 144/95  HR:76bpm  TEMP:98.5 F (36.9 C)(Oral)  RESP:98 %  HT:5' 7.5" (171.5 cm)   WT:157 lb (71.2 kg)  BMI:24.3 PHYSICAL EXAM: Gen: NAD, alert, cooperative with exam, well-appearing HEENT: clear conjunctiva,  CV:  no edema, capillary refill brisk, normal rate Resp: non-labored Skin: no rashes, normal turgor  Neuro: no gross deficits.  Psych:  alert and oriented  Shoulder: Inspection reveals no abnormalities, atrophy or asymmetry. Palpation is with minimal tenderness over AC joint or bicipital groove. ROM is full in all planes except IR, but pain with extremes of motion. IR to L4 bilaterally Rotator cuff strength normal throughout. Positive signs of impingement with positive Neer and Hawkin's tests, empty can  sign. Speeds and Yergason's tests normal. No labral pathology noted with negative Obrien's, negative clunk and good stability. Normal scapular function observed. No painful arc and no drop arm sign.   ASSESSMENT & PLAN:   Rotator cuff tendinitis After discussion with patient regarding options, decided to go with bilateral shoulder injections.  He has already tried NSAIDs, rest.  Will refer to PT for exercises and help with inflammation.  Recommend that if not improved, can consider orthopedics referral and possible MRI to check for tears.    CAD (coronary artery disease) Recommended being on a statin and discussing options with cardiologist.  Do not believe this shoulder pain is due to statin-induced myopathy.  Did not do CPK, as pt has not been taking statin.    Consent obtained and verified. Sterile betadine prep. Furthur cleansed with alcohol. Topical analgesic spray: Ethyl chloride. Joint: right subacromial Approached in typical fashion with:posterior Completed without difficulty Meds: 40 mg kenalog, 3 cc 0.5% marcaine Needle: 25G Aftercare instructions and Red flags advised.  Consent obtained and verified. Sterile betadine prep. Furthur cleansed with alcohol. Topical analgesic spray: Ethyl chloride. Joint: left subacromial Approached in typical fashion with: posterior Completed without difficulty Meds: 40 mg kenalog, 3 cc 0.5% marcaine Needle: 25G Aftercare instructions and Red flags advised.  Signed,  Corliss MarcusAlicia Barnes, DO [facility] 1:36 PM 05/11/16

## 2016-05-10 ENCOUNTER — Telehealth: Payer: Self-pay

## 2016-05-10 NOTE — Telephone Encounter (Signed)
Patient stated his pain level went down 80% percent. He is happy with  Dr. Paralee CancelGreene's decision.

## 2016-05-10 NOTE — Assessment & Plan Note (Signed)
Recommended being on a statin and discussing options with cardiologist.  Do not believe this shoulder pain is due to statin-induced myopathy.  Did not do CPK, as pt has not been taking statin.

## 2016-05-10 NOTE — Progress Notes (Signed)
Patient discussed and examined with Dr. Zachery Dauerbarnes. Agree with findings, assessment and plan of care per her note.   Risks (including but not limited to bleeding and infection, damage to underlying structures), benefits, and alternatives discussed for subacromial injections. Given relative contraindications/recommendations against use of NSAIDs by his cardiologist, and failure of these to improve the symptoms at home, suspect injections would be beneficial. Verbal consent obtained after any questions were answered. Procedure as per Dr. Zachery DauerBarnes note.

## 2016-05-10 NOTE — Assessment & Plan Note (Addendum)
After discussion with patient regarding options, decided to go with bilateral shoulder injections.  He has already tried NSAIDs, rest.  Will refer to PT for exercises and help with inflammation.  Recommend that if not improved, can consider orthopedics referral and possible MRI to check for tears.

## 2016-05-11 ENCOUNTER — Telehealth: Payer: Self-pay | Admitting: Cardiology

## 2016-05-11 ENCOUNTER — Ambulatory Visit: Payer: BLUE CROSS/BLUE SHIELD

## 2016-05-11 DIAGNOSIS — E785 Hyperlipidemia, unspecified: Secondary | ICD-10-CM

## 2016-05-11 MED ORDER — PITAVASTATIN CALCIUM 1 MG PO TABS: ORAL_TABLET | ORAL | 6 refills | Status: DC

## 2016-05-11 NOTE — Telephone Encounter (Signed)
New message    Patient calling wanted to know when to restart crestor or which LDL medication

## 2016-05-11 NOTE — Telephone Encounter (Signed)
Lets try Livalo 1 mg daily to start and see how he does.  Victoriana Aziz SwazilandJordan MD, [facility]

## 2016-05-11 NOTE — Telephone Encounter (Signed)
Returned call to patient.Dr.Jordan's recommendations given.Repeat fasting lipid and hepatic panels 3 months after Livalo.Lab order mailed.

## 2016-05-11 NOTE — Telephone Encounter (Signed)
Spoke with pt, he has been off the crestor for about 4 weeks.he does feel better but he also recently found out he has inflammation in both shoulders. He was given a shot and his shoulders are much better. He reports 85% reduction in his discomfort. He know he needs to restrat something for his cholesterol and he is willing to do so. He is interested in livalo and would like to know what dr Swazilandjordan thinks about that. Will forward to dr Swazilandjordan to review and advise

## 2016-05-12 NOTE — Telephone Encounter (Signed)
FYI

## 2016-05-12 NOTE — Telephone Encounter (Signed)
Good to hear. To Dr. Zachery DauerBarnes, Lorain ChildesFYI.

## 2016-05-24 ENCOUNTER — Ambulatory Visit: Payer: BLUE CROSS/BLUE SHIELD | Attending: Emergency Medicine

## 2016-05-24 DIAGNOSIS — M25512 Pain in left shoulder: Secondary | ICD-10-CM | POA: Diagnosis not present

## 2016-05-24 DIAGNOSIS — M25611 Stiffness of right shoulder, not elsewhere classified: Secondary | ICD-10-CM | POA: Diagnosis not present

## 2016-05-24 DIAGNOSIS — M25612 Stiffness of left shoulder, not elsewhere classified: Secondary | ICD-10-CM | POA: Insufficient documentation

## 2016-05-24 DIAGNOSIS — M25511 Pain in right shoulder: Secondary | ICD-10-CM | POA: Insufficient documentation

## 2016-05-25 NOTE — Patient Instructions (Signed)
From cabinet issued band exerccise  Green and red band  10-20 reps attached bilateral pulls and [place]. 1x/day. Posture awareness.

## 2016-06-06 ENCOUNTER — Ambulatory Visit: Payer: BLUE CROSS/BLUE SHIELD

## 2016-06-08 ENCOUNTER — Other Ambulatory Visit: Payer: Self-pay | Admitting: Cardiology

## 2016-06-08 ENCOUNTER — Telehealth: Payer: Self-pay | Admitting: Cardiology

## 2016-06-08 MED ORDER — TICAGRELOR 60 MG PO TABS: 60.0000 mg | ORAL_TABLET | Freq: Two times a day (BID) | ORAL | 0 refills | Status: DC

## 2016-06-08 NOTE — Telephone Encounter (Signed)
°  1. Which medications need to be refilled? (please list name of each medication and dose if known  ticagrelor (BRILINTA) 60 MG TABS tablet [[id number]][[id number]]    2. Which pharmacy/location (including street and city if local pharmacy) is medication to be sent to?  [facility] on [address]   3. Do they need a 30 day or 90 day supply?    90 day

## 2016-06-08 NOTE — Telephone Encounter (Signed)
Refill sent to the pharmacy electronically.  

## 2016-06-28 DIAGNOSIS — F331 Major depressive disorder, recurrent, moderate: Secondary | ICD-10-CM | POA: Diagnosis not present

## 2016-07-25 DIAGNOSIS — E785 Hyperlipidemia, unspecified: Secondary | ICD-10-CM | POA: Diagnosis not present

## 2016-07-26 LAB — HEPATIC FUNCTION PANEL
ALK PHOS: 72 U/L (ref 40–115)
ALT: 12 U/L (ref 9–46)
AST: 18 U/L (ref 10–35)
Albumin: 4.3 g/dL (ref 3.6–5.1)
BILIRUBIN DIRECT: 0.1 mg/dL (ref <=0.2)
Indirect Bilirubin: 0.8 mg/dL (ref 0.2–1.2)
Total Bilirubin: 0.9 mg/dL (ref 0.2–1.2)
Total Protein: 6.8 g/dL (ref 6.1–8.1)

## 2016-07-26 LAB — LIPID PANEL
CHOL/HDL RATIO: 4.2 ratio (ref <5.0)
CHOLESTEROL: 229 mg/dL — AB (ref <200)
HDL: 54 mg/dL (ref >40)
LDL Cholesterol: 154 mg/dL — ABNORMAL HIGH (ref <100)
Triglycerides: 105 mg/dL (ref <150)
VLDL: 21 mg/dL (ref <30)

## 2016-07-31 NOTE — Progress Notes (Signed)
CARDIOLOGY OFFICE NOTE  Date:  08/01/2016    Jason KochMichael J Mone Date of Birth: 1955-12-06 Medical Record #[id number]  PCP:  Trena PlattStephanie English, PA  Cardiologist:  Swazil[place]Jordan    Chief Complaint  Patient presents with  . Follow-up    pt c/o joint pain and wonders if its his statin medicine  . Coronary Artery Disease  . Hyperlipidemia    History of Present Illness: Jason Maldonado is a 60 y.o. male who is seen for follow up CAD.  He has a past medical history of hyperlipidemia, bipolar disorder, tobacco abuse, and a history of CAD status post previous inferior MI in 2014 with PCI to the RCA.   [facility] as a inferior STEMI in the setting of RCA in-stent restenosis/occlusion. Cardiac catheterization on 03/10/2015 showed 100% stenosis of prox and mid RCA prox to a previously placed unknown stent, 4.038 mm Resolute DES stent postdilated to 4.41 mm proximally and 4.1 mm distally. 50% residual RPDA, 40% 1st RPLB, 40%distal RCA lesion. Post cath, he was placed on aspirin and Brilinta. Echo EF 30-35%, basal to mid inferior akinesis, inferoseptal and inferolateral severe hypokinesis. He did have refractory hypotension that gradually improved. Repeat Echo in October 2016 showed inferior HK with EF 50-55%.   On follow up today he is feeling well. He is still smoking 6 cigs/day. Psychiatry placed him on Wellbutrin and he notes this really helps with his cravings and mood.  He denies any chest pain, SOB, palpitations, or edema. Energy level is good. He walks a lot at work. He did develop severe arthralgias on lipitor. He is now on Livalo 1 mg and tolerates this well.    Past Medical History:  Diagnosis Date  . Bipolar 1 disorder (HCC)   . CAD (coronary artery disease)    a. STEMI 09/2012  single vessel occlusive CAD of the RCA s/p DES to mid RCA, EF 45%;  b. inferior STEMI 03/10/2015 DES to prox and mid RCA  . Depression   . Hyperlipidemia   . LV dysfunction    a. 09/2012 Cath LV gram severe basal to midinferior wall hypokinesis, EF 45% and echo EF 55%.  Marland Kitchen. PAF (paroxysmal atrial fibrillation) (HCC)    a. noted on admission 02/2015 in setting of inf stemi;  b. CHA2DS2VASc = 1.  . Tobacco abuse     Past Surgical History:  Procedure Laterality Date  . CARDIAC CATHETERIZATION N/A 03/10/2015   Procedure: Left Heart Cath and Coronary Angiography;  Surgeon: Lennette Biharihomas A Kelly, MD;  [facility];  Service: Cardiovascular;  Laterality: N/A;  . CARDIAC CATHETERIZATION  03/10/2015   Procedure: Coronary Stent Intervention;  Surgeon: Lennette Biharihomas A Kelly, MD;  Location: [facility] INVASIVE CV LAB;  Service: Cardiovascular;;  . LEFT HEART CATHETERIZATION WITH CORONARY ANGIOGRAM N/A 09/27/2012   Procedure: LEFT HEART CATHETERIZATION WITH CORONARY ANGIOGRAM;  Surgeon: Tarryn Bogdan M SwazilandJordan, MD;  [facility];  Service: Cardiovascular;  Laterality: N/A;     Medications: Current Outpatient Prescriptions  Medication Sig Dispense Refill  . ALPRAZolam (XANAX) 0.5 MG tablet TAKE 1/2 TO 1 TABLET BY MOUTH EVERY 8 HOURS AS NEEDED FOR ANXIETY  0  . aspirin EC 81 MG EC tablet Take 1 tablet (81 mg total) by mouth daily.    Marland Kitchen. buPROPion (WELLBUTRIN) 75 MG tablet Take 75 mg by mouth daily.  1  . carvedilol (COREG) 3.125 MG tablet TAKE 1 TABLET BY MOUTH TWICE A DAY  180 tablet 0  . nitroGLYCERIN (NITROSTAT) 0.4 MG SL tablet Place 1 tablet (0.4 mg total) under the tongue every 5 (five) minutes as needed for chest pain (up to 3 doses). 25 tablet 3  . ticagrelor (BRILINTA) 60 MG TABS tablet Take 1 tablet (60 mg total) by mouth 2 (two) times daily. 180 tablet 0  . Pitavastatin Calcium (LIVALO) 2 MG TABS Take 1 tablet (2 mg total) by mouth daily. 30 tablet 11   No current facility-administered medications for this visit.     Allergies: No Known Allergies  Social History: The patient  reports that he has been smoking Cigarettes.  He has a 40.00 pack-year smoking history. He does  not have any smokeless tobacco history on file. He reports that he does not drink alcohol or use drugs.   Family History: The patient's family history includes CAD in his father.   Review of Systems: Please see the history of present illness.    All other systems are reviewed and negative.   Physical Exam: VS:  BP 130/88 (BP Location: Right Arm, Patient Position: Sitting, Cuff Size: Normal)   Ht 5' 7.5" (1.715 m)   Wt 156 lb (70.8 kg)   SpO2 98%   BMI 24.07 kg/m  .  BMI Body mass index is 24.07 kg/m.  Wt Readings from Last 3 Encounters:  08/01/16 156 lb (70.8 kg)  05/09/16 157 lb (71.2 kg)  10/01/15 154 lb 6.4 oz (70 kg)    General: Pleasant. Well developed, well nourished and in no acute distress. Color is chronically sallow.  HEENT: Normal.  Neck: Supple, no JVD, carotid bruits, or masses noted.  Cardiac: Regular rate and rhythm. +S3. No edema.  Respiratory:  Lungs are clear to auscultation bilaterally with normal work of breathing.  GI: Soft and nontender.  MS: No deformity or atrophy. Gait and ROM intact.  Skin: Warm and dry. Color is normal.  Neuro:  Strength and sensation are intact and no gross focal deficits noted.  Psych: Alert, appropriate and with normal affect.   LABORATORY DATA:  EKG:  EKG is ordered today. NSR with PVCs. Old inferior infarct. No change. I have personally reviewed and interpreted this study.   Lab Results  Component Value Date   WBC 9.2 08/03/2015   HGB 14.6 08/03/2015   HCT 43.2 08/03/2015   PLT 247 08/03/2015   GLUCOSE 91 08/03/2015   CHOL 229 (H) 07/25/2016   TRIG 105 07/25/2016   HDL 54 07/25/2016   LDLCALC 154 (H) 07/25/2016   ALT 12 07/25/2016   AST 18 07/25/2016   NA 140 08/03/2015   K 4.2 08/03/2015   CL 103 08/03/2015   CREATININE 1.25 08/03/2015   BUN 20 08/03/2015   CO2 25 08/03/2015   TSH 1.565 08/03/2015   PSA 1.30 08/03/2015   INR 1.18 09/27/2012   HGBA1C 5.7 (H) 09/27/2012    BNP (last 3 results) No results  for input(s): BNP in the last 8760 hours.  ProBNP (last 3 results) No results for input(s): PROBNP in the last 8760 hours.   Other Studies Reviewed Today: Echo: 06/08/15:Study Conclusions  - Procedure narrative: Limited transthoracic echocardiography for left ventricular function evaluation. Image quality was adequate. - Left ventricle: The cavity size was normal. Wall thickness was normal. Systolic function was normal. The estimated ejection fraction was in the range of 50% to 55%. There is inferior and inferoseptal hypokinesis.  Impressions:  - Limited echo for LV function. There is inferior and inferoseptal  hypokinesis. LVEF has improved to 50-55%.  Assessment/Plan: 1. CAD s/p inferior MI with stenting in 2014. Recurrent STEMI in July 2016 with instent restenosis/occlusion. S/p repeat DES. On DAPT. Would favor continuing this long term. Continue Brilinta 60 mg bid and ASA 81 mg daily. Continue beta blocker. Encourage exercise and complete smoking cessation.  2. Tobacco abuse. Encourage efforts at complete smoking cessation. On Wellbutrin.  3. Ischemic cardiomyopathy in setting of acute MI. EF has recovered.   4. Hyperlipidemia. Intolerant of lipitor. Now on Livalo and tolerating well. Will increase to 2 mg daily. Repeat lab 3 months. May need to consider Zetia or PCSK9 inhibitor if not at goal.  5. Bipolar disorder  I will follow up in 6 months.  Current medicines are reviewed with the patient today.  The patient does not have concerns regarding medicines other than what has been noted above.  The following changes have been made:  See above.  Labs/ tests ordered today include:    Orders Placed This Encounter  Procedures  . EKG 12-Lead     Disposition:   FU with me in 6 months.   Patient is agreeable to this plan and will call if any problems develop in the interim.   Signed: Darus Hershman SwazilandJordan MD, [facility]    08/01/2016 3:55 PM

## 2016-08-01 ENCOUNTER — Ambulatory Visit (INDEPENDENT_AMBULATORY_CARE_PROVIDER_SITE_OTHER): Payer: BLUE CROSS/BLUE SHIELD | Admitting: Cardiology

## 2016-08-01 ENCOUNTER — Encounter: Payer: Self-pay | Admitting: Cardiology

## 2016-08-01 VITALS — BP 130/88 | Ht 67.5 in | Wt 156.0 lb

## 2016-08-01 DIAGNOSIS — Z720 Tobacco use: Secondary | ICD-10-CM

## 2016-08-01 DIAGNOSIS — I2510 Atherosclerotic heart disease of native coronary artery without angina pectoris: Secondary | ICD-10-CM | POA: Diagnosis not present

## 2016-08-01 DIAGNOSIS — E7800 Pure hypercholesterolemia, unspecified: Secondary | ICD-10-CM

## 2016-08-01 MED ORDER — PITAVASTATIN CALCIUM 2 MG PO TABS: 2.0000 mg | ORAL_TABLET | Freq: Every day | ORAL | 11 refills | Status: DC

## 2016-08-01 NOTE — Patient Instructions (Signed)
Increase Livalo to 2 mg daily  Stop smoking completely   I will see you in 6 months.

## 2016-08-03 ENCOUNTER — Other Ambulatory Visit: Payer: Self-pay | Admitting: Nurse Practitioner

## 2016-08-15 NOTE — Therapy (Addendum)
[place], [place], [address] Phone: [phone]   Fax:  [phone]  Physical Therapy Evaluation/ Discharge  Patient Details  Name: Jason Maldonado MRN: [id number] Date of Birth: 26-Nov-1955 Referring Provider: Lupita Raider, DO  Encounter Date: 05/24/2016      PT End of Session - 08/15/16 1257    Visit Number 1   Number of Visits 6   Date for PT Re-Evaluation 07/07/16   Authorization Type BCBS   PT Start Time 0340   PT Stop Time 0430   PT Time Calculation (min) 50 min      Past Medical History:  Diagnosis Date  . Bipolar 1 disorder ([place])   . CAD (coronary artery disease)    a. STEMI 09/2012  single vessel occlusive CAD of the RCA s/p DES to mid RCA, EF 45%;  b. inferior STEMI 03/10/2015 DES to prox and mid RCA  . Depression   . Hyperlipidemia   . LV dysfunction    a. 09/2012 Cath LV gram severe basal to midinferior wall hypokinesis, EF 45% and echo EF 55%.  Marland Kitchen PAF (paroxysmal atrial fibrillation) ([place])    a. noted on admission 02/2015 in setting of inf stemi;  b. CHA2DS2VASc = 1.  . Tobacco abuse     Past Surgical History:  Procedure Laterality Date  . CARDIAC CATHETERIZATION N/A 03/10/2015   Procedure: Left Heart Cath and Coronary Angiography;  Surgeon: Troy Sine, MD;  Location: [facility] CV LAB;  Service: Cardiovascular;  Laterality: N/A;  . CARDIAC CATHETERIZATION  03/10/2015   Procedure: Coronary Stent Intervention;  Surgeon: Troy Sine, MD;  Location: [place] CV LAB;  Service: Cardiovascular;;  . LEFT HEART CATHETERIZATION WITH CORONARY ANGIOGRAM N/A 09/27/2012   Procedure: LEFT HEART CATHETERIZATION WITH CORONARY ANGIOGRAM;  Surgeon: Peter M Martinique, MD;  [facility];  Service: Cardiovascular;  Laterality: N/A;    There were no vitals filed for this visit.       Subjective Assessment - 08/15/16 1254    Subjective Onset of joint pain was initially in knees  /thighs/wrist/hips for 4 days then this pain stopped and then started in both shoulders  6 weeks ago. He has had injection into each shoulder  with benefit.    Still 85% better than prior to surgery.   ROM and strength has returned.      Limitations --  No limits now since injection.    Currently in Pain? Yes   Pain Location Shoulder   Pain Orientation Left;Right;Anterior   Pain Descriptors / Indicators Tender   Pain Type Chronic pain   Pain Onset 1 to 4 weeks ago   Pain Frequency Intermittent   Multiple Pain Sites No            [facility] PT Assessment - 08/15/16 0001      Assessment   Medical Diagnosis bilateral shoulder pain  RTC tendonitis   Referring Provider Alecia Chitanand, DO   Onset Date/Surgical Date --  6 weeks   Next MD Visit None set   Prior Therapy No     Precautions   Precautions None     Restrictions   Weight Bearing Restrictions No                           PT Education - 08/15/16 1257    Education provided Yes   Person(s) Educated Patient   Methods Tactile cues;Demonstration;Explanation;Verbal cues  Comprehension Verbalized understanding;Returned demonstration             PT Long Term Goals - 05/24/16 1728      PT LONG TERM GOAL #1   Title He will be independent with all HEP issued   Time 3   Period Weeks   Status New     PT LONG TERM GOAL #2   Title He will report on discomfort in either shoulder with work and home tasks   Time 3   Period Weeks   Status New     PT LONG TERM GOAL #3   Title He will have 1-2/10 pain in either shoulder and able to reach to 1 inch below inferior scapula.    Time 3   Period Weeks   Status New               Plan - 08/15/16 1258    Clinical Impression Statement Mr Murgia presesnts with low complexity eval with resovling shoulder pain. His range is good and strength except reaching behind back and continued anterior shoulder soreness, mild rounded shoulders. He should be much  bett in 2-3 weeks   Rehab Potential Good   PT Frequency 1x / week   PT Duration 3 weeks   PT Treatment/Interventions Iontophoresis '4mg'$ /ml Dexamethasone;Ultrasound;Therapeutic exercise;Patient/family education;Manual techniques;Taping   PT Next Visit Plan REview HEP , add as needed, ionto if OK'd   PT Home Exercise Plan band exercises   Consulted and Agree with Plan of Care Patient      Patient will benefit from skilled therapeutic intervention in order to improve the following deficits and impairments:  Pain, Decreased range of motion, Impaired UE functional use  Visit Diagnosis: Bilateral shoulder pain, unspecified chronicity - Plan: PT plan of care cert/re-cert  Stiffness of left shoulder, not elsewhere classified - Plan: PT plan of care cert/re-cert  Stiffness of right shoulder, not elsewhere classified - Plan: PT plan of care cert/re-cert     Problem List Patient Active Problem List   Diagnosis Date Noted  . Bilateral shoulder pain 05/09/2016  . Rotator cuff tendinitis 05/09/2016  . Chronic systolic CHF (congestive heart failure) ([place]) 04/27/2015  . Acute ST elevation myocardial infarction (STEMI) involving other coronary artery of inferior wall ([place]) 03/10/2015  . PAF (paroxysmal atrial fibrillation) ([place]) 03/10/2015  . ST elevation myocardial infarction (STEMI) involving right coronary artery in recovery phase ([place]) 03/10/2015  . Acute MI, inferior wall ([place]) 03/10/2015  . LV dysfunction   . Cardiogenic shock ([place]) 10/01/2012  . ST elevation myocardial infarction (STEMI) of inferior wall ([place]) 09/27/2012  . Hyperlipidemia 09/27/2012  . Tobacco abuse 09/27/2012  . CAD (coronary artery disease) 09/27/2012  . Bipolar 1 disorder ([place])    Evaluation performed on 10/11 - late note entered. Darrel Hoover 08/15/2016, 12:58 PM  [facility] [address] [place], [place], [address] Phone: [phone]   Fax:   [phone]  Name: Jason Maldonado MRN: [id number] Date of Birth: May 09, 1956  PHYSICAL THERAPY DISCHARGE SUMMARY  Visits from Start of Care: 1  Current functional level related to goals / functional outcomes: Unknown as he did not return after the first visit   Remaining deficits: Unknown   Education / Equipment: HEP  Plan: Patient agrees to discharge.  Patient goals were not met. Patient is being discharged due to not returning since the last visit.  ?????    Lillette Boxer Wanette Robison  PT   08/15/16  1:02 PM

## 2016-10-11 ENCOUNTER — Ambulatory Visit (INDEPENDENT_AMBULATORY_CARE_PROVIDER_SITE_OTHER): Payer: BLUE CROSS/BLUE SHIELD | Admitting: Physician Assistant

## 2016-10-11 VITALS — BP 146/94 | HR 72 | Temp 99.2°F | Resp 16 | Ht 67.5 in | Wt 156.8 lb

## 2016-10-11 DIAGNOSIS — J019 Acute sinusitis, unspecified: Secondary | ICD-10-CM

## 2016-10-11 MED ORDER — IPRATROPIUM BROMIDE 0.03 % NA SOLN: 2.0000 | Freq: Two times a day (BID) | NASAL | 0 refills | Status: DC

## 2016-10-11 MED ORDER — AMOXICILLIN-POT CLAVULANATE 875-125 MG PO TABS: 1.0000 | ORAL_TABLET | Freq: Two times a day (BID) | ORAL | 0 refills | Status: AC

## 2016-10-11 MED ORDER — GUAIFENESIN ER 1200 MG PO TB12: 1.0000 | ORAL_TABLET | Freq: Two times a day (BID) | ORAL | 1 refills | Status: DC | PRN

## 2016-10-11 MED ORDER — HYDROCOD POLST-CPM POLST ER 10-8 MG/5ML PO SUER: 5.0000 mL | Freq: Every evening | ORAL | 0 refills | Status: DC | PRN

## 2016-10-11 NOTE — Progress Notes (Signed)
[facility] [address], [place] [id number] [phone]  Date:  10/11/2016   Name:  Jason KochMichael J Tyrell   DOB:  1956/02/07   MRN:  [id number]  PCP:  Trena PlattStephanie Georgiana Spillane, PA    History of Present Illness:  Jason Maldonado is a 61 y.o. male patient who [facility] for cc of cough, sore throat, and congestion. Sore throat, and coughing for 1 week that is worsening.  Yellow/green mucus.  Coughing like spasm that is productive.  He has hot sensation and headache.  Mucus is thick.  He has occipital head pain and behind eyes.   3 days later after symptoms began, he started the claritin, and 2 days later he had improvement of his allergies.  He is taking the claritin every 24 hours.    Patient Active Problem List   Diagnosis Date Noted  . Bilateral shoulder pain 05/09/2016  . Rotator cuff tendinitis 05/09/2016  . Chronic systolic CHF (congestive heart failure) (HCC) 04/27/2015  . Acute ST elevation myocardial infarction (STEMI) involving other coronary artery of inferior wall (HCC) 03/10/2015  . PAF (paroxysmal atrial fibrillation) (HCC) 03/10/2015  . ST elevation myocardial infarction (STEMI) involving right coronary artery in recovery phase (HCC) 03/10/2015  . Acute MI, inferior wall (HCC) 03/10/2015  . LV dysfunction   . Cardiogenic shock (HCC) 10/01/2012  . ST elevation myocardial infarction (STEMI) of inferior wall (HCC) 09/27/2012  . Hyperlipidemia 09/27/2012  . Tobacco abuse 09/27/2012  . CAD (coronary artery disease) 09/27/2012  . Bipolar 1 disorder (HCC)     Past Medical History:  Diagnosis Date  . Bipolar 1 disorder (HCC)   . CAD (coronary artery disease)    a. STEMI 09/2012  single vessel occlusive CAD of the RCA s/p DES to mid RCA, EF 45%;  b. inferior STEMI 03/10/2015 DES to prox and mid RCA  . Depression   . Hyperlipidemia   . LV dysfunction    a. 09/2012 Cath LV gram severe basal to midinferior wall hypokinesis, EF 45% and echo EF 55%.  Marland Kitchen. PAF  (paroxysmal atrial fibrillation) (HCC)    a. noted on admission 02/2015 in setting of inf stemi;  b. CHA2DS2VASc = 1.  . Tobacco abuse     Past Surgical History:  Procedure Laterality Date  . CARDIAC CATHETERIZATION N/A 03/10/2015   Procedure: Left Heart Cath and Coronary Angiography;  Surgeon: Lennette Biharihomas A Kelly, MD;  [facility];  Service: Cardiovascular;  Laterality: N/A;  . CARDIAC CATHETERIZATION  03/10/2015   Procedure: Coronary Stent Intervention;  Surgeon: Lennette Biharihomas A Kelly, MD;  Location: [facility] INVASIVE CV LAB;  Service: Cardiovascular;;  . LEFT HEART CATHETERIZATION WITH CORONARY ANGIOGRAM N/A 09/27/2012   Procedure: LEFT HEART CATHETERIZATION WITH CORONARY ANGIOGRAM;  Surgeon: Peter M SwazilandJordan, MD;  [facility];  Service: Cardiovascular;  Laterality: N/A;    Social History  Substance Use Topics  . Smoking status: Current Every Day Smoker    Packs/day: 1.00    Years: 40.00    Types: Cigarettes    Last attempt to quit: 04/15/2015  . Smokeless tobacco: Never Used     Comment: using nicotine gum  . Alcohol use No    Family History  Problem Relation Age of Onset  . CAD Father     deceased.    No Known Allergies  Medication list has been reviewed and updated.  Current Outpatient Prescriptions on File Prior to Visit  Medication Sig Dispense Refill  .  ALPRAZolam (XANAX) 0.5 MG tablet TAKE 1/2 TO 1 TABLET BY MOUTH EVERY 8 HOURS AS NEEDED FOR ANXIETY  0  . aspirin EC 81 MG EC tablet Take 1 tablet (81 mg total) by mouth daily.    Marland Kitchen buPROPion (WELLBUTRIN) 75 MG tablet Take 75 mg by mouth daily.  1  . carvedilol (COREG) 3.125 MG tablet TAKE 1 TABLET BY MOUTH TWICE A DAY 180 tablet 0  . NITROSTAT 0.4 MG SL tablet PLACE 1 TABLET UNDER TONGUE EVERY 5 MINUTES IF NEEDED FOR CHEST PAIN. UP TO 3 DOSES 25 tablet 1  . Pitavastatin Calcium (LIVALO) 2 MG TABS Take 1 tablet (2 mg total) by mouth daily. 30 tablet 11  . ticagrelor (BRILINTA) 60 MG TABS tablet Take 1 tablet (60  mg total) by mouth 2 (two) times daily. 180 tablet 0   No current facility-administered medications on file prior to visit.     ROS ROS otherwise unremarkable unless listed above.   Physical Examination: BP (!) 146/94   Pulse 72   Temp 99.2 F (37.3 C) (Oral)   Resp 16   Ht 5' 7.5" (1.715 m)   Wt 156 lb 12.8 oz (71.1 kg)   SpO2 98%   BMI 24.20 kg/m  Ideal Body Weight: Weight in (lb) to have BMI = 25: 161.7  Physical Exam  Constitutional: He is oriented to person, place, and time. He appears well-developed and well-nourished. No distress.  HENT:  Head: Normocephalic and atraumatic.  Right Ear: Tympanic membrane, external ear and ear canal normal.  Left Ear: Tympanic membrane, external ear and ear canal normal.  Nose: Mucosal edema and rhinorrhea present. Right sinus exhibits maxillary sinus tenderness. Right sinus exhibits no frontal sinus tenderness. Left sinus exhibits maxillary sinus tenderness. Left sinus exhibits no frontal sinus tenderness.  Mouth/Throat: No uvula swelling. No oropharyngeal exudate, posterior oropharyngeal edema or posterior oropharyngeal erythema.  Eyes: Conjunctivae, EOM and lids are normal. Pupils are equal, round, and reactive to light. Right eye exhibits normal extraocular motion. Left eye exhibits normal extraocular motion.  Neck: Trachea normal and full passive range of motion without pain. No edema and no erythema present.  Cardiovascular: Normal rate.   Pulmonary/Chest: Effort normal. No respiratory distress. He has no decreased breath sounds. He has no wheezes. He has no rhonchi.  Neurological: He is alert and oriented to person, place, and time.  Skin: Skin is warm and dry. He is not diaphoretic.  Psychiatric: He has a normal mood and affect. His behavior is normal.   Assessment and Plan: Jason Maldonado is a 61 y.o. male who is here today for cough, sore throat, and head pain. --anti-pyretic use.  Nasal spray and mucinex advised first 72 hours.   If his symptoms do not improve, he will start the augmentin.  Acute sinusitis, recurrence not specified, unspecified location - Plan: amoxicillin-clavulanate (AUGMENTIN) 875-125 MG tablet, ipratropium (ATROVENT) 0.03 % nasal spray, Guaifenesin (MUCINEX MAXIMUM STRENGTH) 1200 MG TB12  Trena Platt, PA-C [facility] 2/28/20186:59 PM

## 2016-10-11 NOTE — Patient Instructions (Addendum)
Continue to hydrate well with 64 oz or more.   I would like you to do the nasal spray and the mucinex.  If you have no improvement in the next 48-72 hours, then start the augmentin and take to completion. If you develop fever over 99.5 go ahead and start it.    Sinusitis, Adult Sinusitis is soreness and inflammation of your sinuses. Sinuses are hollow spaces in the bones around your face. They are located:  Around your eyes.  In the middle of your forehead.  Behind your nose.  In your cheekbones. Your sinuses and nasal passages are lined with a stringy fluid (mucus). Mucus normally drains out of your sinuses. When your nasal tissues get inflamed or swollen, the mucus can get trapped or blocked so air cannot flow through your sinuses. This lets bacteria, viruses, and funguses grow, and that leads to infection. Follow these instructions at home: Medicines   Take, use, or apply over-the-counter and prescription medicines only as told by your doctor. These may include nasal sprays.  If you were prescribed an antibiotic medicine, take it as told by your doctor. Do not stop taking the antibiotic even if you start to feel better. Hydrate and Humidify   Drink enough water to keep your pee (urine) clear or pale yellow.  Use a cool mist humidifier to keep the humidity level in your home above 50%.  Breathe in steam for 10-15 minutes, 3-4 times a day or as told by your doctor. You can do this in the bathroom while a hot shower is running.  Try not to spend time in cool or dry air. Rest   Rest as much as possible.  Sleep with your head raised (elevated).  Make sure to get enough sleep each night. General instructions   Put a warm, moist washcloth on your face 3-4 times a day or as told by your doctor. This will help with discomfort.  Wash your hands often with soap and water. If there is no soap and water, use hand sanitizer.  Do not smoke. Avoid being around people who are smoking  (secondhand smoke).  Keep all follow-up visits as told by your doctor. This is important. Contact a doctor if:  You have a fever.  Your symptoms get worse.  Your symptoms do not get better within 10 days. Get help right away if:  You have a very bad headache.  You cannot stop throwing up (vomiting).  You have pain or swelling around your face or eyes.  You have trouble seeing.  You feel confused.  Your neck is stiff.  You have trouble breathing. This information is not intended to replace advice given to you by your health care provider. Make sure you discuss any questions you have with your health care provider. Document Released: 01/17/2008 Document Revised: 03/26/2016 Document Reviewed: 05/26/2015 Elsevier Interactive Patient Education  2017 [employer]    IF you received an x-ray today, you will receive an invoice from [facility]. Please contact [facility] at [phone] with questions or concerns regarding your invoice.   IF you received labwork today, you will receive an invoice from [facility]. Please contact [facility] at [phone] with questions or concerns regarding your invoice.   Our billing staff will not be able to assist you with questions regarding bills from these companies.  You will be contacted with the lab results as soon as they are available. The fastest way to get your results is to activate your My Chart account.  Instructions are located on the last page of this paperwork. If you have not heard from [place] regarding the results in 2 weeks, please contact this office.

## 2016-10-20 ENCOUNTER — Other Ambulatory Visit: Payer: Self-pay | Admitting: Cardiology

## 2016-11-09 ENCOUNTER — Ambulatory Visit (INDEPENDENT_AMBULATORY_CARE_PROVIDER_SITE_OTHER): Payer: BLUE CROSS/BLUE SHIELD

## 2016-11-09 ENCOUNTER — Ambulatory Visit (INDEPENDENT_AMBULATORY_CARE_PROVIDER_SITE_OTHER): Payer: BLUE CROSS/BLUE SHIELD | Admitting: Physician Assistant

## 2016-11-09 VITALS — BP 150/84 | HR 68 | Temp 98.3°F | Resp 18 | Ht 67.5 in | Wt 154.4 lb

## 2016-11-09 DIAGNOSIS — R0982 Postnasal drip: Secondary | ICD-10-CM

## 2016-11-09 DIAGNOSIS — R059 Cough, unspecified: Secondary | ICD-10-CM

## 2016-11-09 DIAGNOSIS — R05 Cough: Secondary | ICD-10-CM

## 2016-11-09 MED ORDER — MONTELUKAST SODIUM 10 MG PO TABS: 10.0000 mg | ORAL_TABLET | Freq: Every day | ORAL | 3 refills | Status: DC

## 2016-11-09 NOTE — Patient Instructions (Addendum)
We are going to start the Singulair. I would like you to use this nasal rinse before bed.   Please continue the loratadine.   Let me know if your symptoms do not improve within 2 weeks.  You can certainly call if your symptoms are worsening.    Nasal Allergies Nasal allergies are a reaction to allergens in the air. Allergens are tiny specks (particles) in the air that cause your body to have an allergic reaction. Nasal allergies are not passed from person to person (contagious). They cannot be cured, but they can be controlled. Common causes of nasal allergies include:  Pollen from grasses, trees, and weeds.  House dust mites.  Pet dander.  Mold. Follow these instructions at home:  Avoid the allergen that is causing your symptoms, if you can.  Keep windows closed. If possible, use air conditioning when there is a lot of pollen in the air.  Do not use fans in your home.  Do not hang clothes outside to dry.  Wear sunglasses to keep pollen out of your eyes.  Wash your hands right away after you touch household pets.  Take over-the-counter and prescription medicines only as told by your doctor.  Keep all follow-up visits as told by your doctor. This is important. Contact a doctor if:  You have a fever.  You have a cough that does not go away (is persistent).  You start to make whistling sounds when you breathe (wheeze).  Your symptoms do not get better with treatment.  You have thick fluid coming from your nose.  You start to have nosebleeds. Get help right away if:  Your tongue or your lips are swollen.  You have trouble breathing.  You feel light-headed or you feel like you are going to pass out (faint).  You have cold sweats. This information is not intended to replace advice given to you by your health care provider. Make sure you discuss any questions you have with your health care provider. Document Released: 11/30/2010 Document Revised: 01/06/2016 Document  Reviewed: 02/10/2015 Elsevier Interactive Patient Education  2017 [employer]     IF you received an x-ray today, you will receive an invoice from [facility]. Please contact [facility] at [phone] with questions or concerns regarding your invoice.   IF you received labwork today, you will receive an invoice from [facility]. Please contact [facility] at [phone] with questions or concerns regarding your invoice.   Our billing staff will not be able to assist you with questions regarding bills from these companies.  You will be contacted with the lab results as soon as they are available. The fastest way to get your results is to activate your My Chart account. Instructions are located on the last page of this paperwork. If you have not heard from us regarding the results in 2 weeks, please contact this office.

## 2016-11-09 NOTE — Progress Notes (Signed)
PRIMARY CARE AT [facility] [address] [place] [address] [phone]  Date:  11/09/2016   Name:  Jason Maldonado   DOB:  1956/03/12   MRN:  [id number]  PCP:  Trena Platt, PA    History of Present Illness:  Jason Maldonado is a 61 y.o. male patient who presents to PCP with  Chief Complaint  Patient presents with  . URI  . Cough    coughing a lot at night   . Allergies    pollen causing runny nose      Non-productive cough.  No sob or dyspnea.  Mild nasal congestion.   Sneezing and cough will come and go. No fever.  He does have some subjective fever and nightsweats at night.   He never felt completely better.     Patient Active Problem List   Diagnosis Date Noted  . Bilateral shoulder pain 05/09/2016  . Rotator cuff tendinitis 05/09/2016  . Chronic systolic CHF (congestive heart failure) (HCC) 04/27/2015  . Acute ST elevation myocardial infarction (STEMI) involving other coronary artery of inferior wall (HCC) 03/10/2015  . PAF (paroxysmal atrial fibrillation) (HCC) 03/10/2015  . ST elevation myocardial infarction (STEMI) involving right coronary artery in recovery phase (HCC) 03/10/2015  . Acute MI, inferior wall (HCC) 03/10/2015  . LV dysfunction   . Cardiogenic shock (HCC) 10/01/2012  . ST elevation myocardial infarction (STEMI) of inferior wall (HCC) 09/27/2012  . Hyperlipidemia 09/27/2012  . Tobacco abuse 09/27/2012  . CAD (coronary artery disease) 09/27/2012  . Bipolar 1 disorder (HCC)     Past Medical History:  Diagnosis Date  . Bipolar 1 disorder (HCC)   . CAD (coronary artery disease)    a. STEMI 09/2012  single vessel occlusive CAD of the RCA s/p DES to mid RCA, EF 45%;  b. inferior STEMI 03/10/2015 DES to prox and mid RCA  . Depression   . Hyperlipidemia   . LV dysfunction    a. 09/2012 Cath LV gram severe basal to midinferior wall hypokinesis, EF 45% and echo EF 55%.  Marland Kitchen PAF (paroxysmal atrial fibrillation) (HCC)    a. noted on admission 02/2015 in  setting of inf stemi;  b. CHA2DS2VASc = 1.  . Tobacco abuse     Past Surgical History:  Procedure Laterality Date  . CARDIAC CATHETERIZATION N/A 03/10/2015   Procedure: Left Heart Cath and Coronary Angiography;  Surgeon: Lennette Bihari, MD;  [facility] INV[facility]IVE CV LAB;  Service: Cardiovascular;  Laterality: N/A;  . CARDIAC CATHETERIZATION  03/10/2015   Procedure: Coronary Stent Intervention;  Surgeon: Lennette Bihari, MD;  Location: [facility] INVASIVE CV LAB;  Service: Cardiovascular;;  . LEFT HEART CATHETERIZATION WITH CORONARY ANGIOGRAM N/A 09/27/2012   Procedure: LEFT HEART CATHETERIZATION WITH CORONARY ANGIOGRAM;  Surgeon: [name], MD;  Location: [facility] CATH LAB;  Service: Cardiovascular;  Laterality: N/A;    Social History  Substance Use Topics  . Smoking status: Current Every Day Smoker    Packs/day: 1.00    Years: 40.00    Types: Cigarettes    Last attempt to quit: 04/15/2015  . Smokeless tobacco: Never Used     Comment: using nicotine gum  . Alcohol use No    Family History  Problem Relation Age of Onset  . CAD Father     deceased.    No Known Allergies  Medication list has been reviewed and updated.  Current Outpatient Prescriptions on File Prior to Visit  Medication Sig Dispense Refill  .  ALPRAZolam (XANAX) 0.5 MG tablet TAKE 1/2 TO 1 TABLET BY MOUTH EVERY 8 HOURS AS NEEDED FOR ANXIETY  0  . aspirin EC 81 MG EC tablet Take 1 tablet (81 mg total) by mouth daily.    Marland Kitchen buPROPion (WELLBUTRIN) 75 MG tablet Take 75 mg by mouth daily.  1  . carvedilol (COREG) 3.125 MG tablet TAKE 1 TABLET BY MOUTH TWICE A DAY 180 tablet 0  . NITROSTAT 0.4 MG SL tablet PLACE 1 TABLET UNDER TONGUE EVERY 5 MINUTES IF NEEDED FOR CHEST PAIN. UP TO 3 DOSES 25 tablet 1  . Pitavastatin Calcium (LIVALO) 2 MG TABS Take 1 tablet (2 mg total) by mouth daily. 30 tablet 11  . ticagrelor (BRILINTA) 60 MG TABS tablet Take 1 tablet (60 mg total) by mouth 2 (two) times daily. 180 tablet 0  .  chlorpheniramine-HYDROcodone (TUSSIONEX PENNKINETIC ER) 10-8 MG/5ML SUER Take 5 mLs by mouth at bedtime as needed. (Patient not taking: Reported on 11/09/2016) 40 mL 0  . Guaifenesin (MUCINEX MAXIMUM STRENGTH) 1200 MG TB12 Take 1 tablet (1,200 mg total) by mouth every 12 (twelve) hours as needed. (Patient not taking: Reported on 11/09/2016) 14 tablet 1  . ipratropium (ATROVENT) 0.03 % nasal spray Place 2 sprays into both nostrils 2 (two) times daily. (Patient not taking: Reported on 11/09/2016) 30 mL 0   No current facility-administered medications on file prior to visit.     ROS ROS otherwise unremarkable unless listed above.  Physical Examination: BP (!) 150/84   Pulse 68   Temp 98.3 F (36.8 C) (Oral)   Resp 18   Ht 5' 7.5" (1.715 m)   Wt 154 lb 6.4 oz (70 kg)   SpO2 98%   BMI 23.83 kg/m  Ideal Body Weight: Weight in (lb) to have BMI = 25: 161.7  Physical Exam  Constitutional: He is oriented to person, place, and time. He appears well-developed and well-nourished. No distress.  HENT:  Head: Normocephalic and atraumatic.  Right Ear: Tympanic membrane, external ear and ear canal normal.  Left Ear: Tympanic membrane, external ear and ear canal normal.  Nose: Mucosal edema and rhinorrhea present. Right sinus exhibits no maxillary sinus tenderness and no frontal sinus tenderness. Left sinus exhibits no maxillary sinus tenderness and no frontal sinus tenderness.  Mouth/Throat: No uvula swelling. No oropharyngeal exudate, posterior oropharyngeal edema or posterior oropharyngeal erythema.  Eyes: Conjunctivae, EOM and lids are normal. Pupils are equal, round, and reactive to light. Right eye exhibits normal extraocular motion. Left eye exhibits normal extraocular motion.  Neck: Trachea normal and full passive range of motion without pain. No edema and no erythema present.  Cardiovascular: Normal rate.   Pulmonary/Chest: Effort normal. No respiratory distress. He has no decreased breath  sounds. He has no wheezes. He has no rhonchi.  Neurological: He is alert and oriented to person, place, and time.  Skin: Skin is warm and dry. He is not diaphoretic.  Psychiatric: He has a normal mood and affect. His behavior is normal.    Dg Chest 2 View  Result Date: 11/09/2016 CLINICAL DATA:  Cough with difficulty breathing. EXAM: CHEST  2 VIEW COMPARISON:  None. FINDINGS: Lungs are hyperexpanded. Interstitial markings are diffusely coarsened with chronic features. Cardiopericardial silhouette is at upper limits of normal for size. The visualized bony structures of the thorax are intact. IMPRESSION: No active cardiopulmonary disease. Electronically Signed   By: Kennith Center M.D.   On: 11/09/2016 16:40     Assessment and Plan: Casimiro Needle  Adonis HugueninJ Radford is a 61 y.o. male who is here today for cc of sinus inflammation. Appears to be worsening allergies. I am starting him on singulair to help his symptoms.  He would like to hold off on a prednisone taper.  Advised to continue the flonase, and the loratidine. Post-nasal drip - Plan: montelukast (SINGULAIR) 10 MG tablet  Cough - Plan: DG Chest 2 View  Trena PlattStephanie English, PA-C [facility] 3/29/20187:08 PM

## 2016-11-27 ENCOUNTER — Other Ambulatory Visit: Payer: Self-pay | Admitting: Cardiology

## 2016-12-13 DIAGNOSIS — F331 Major depressive disorder, recurrent, moderate: Secondary | ICD-10-CM | POA: Diagnosis not present

## 2016-12-14 DIAGNOSIS — I2510 Atherosclerotic heart disease of native coronary artery without angina pectoris: Secondary | ICD-10-CM | POA: Diagnosis not present

## 2016-12-14 DIAGNOSIS — E7800 Pure hypercholesterolemia, unspecified: Secondary | ICD-10-CM | POA: Diagnosis not present

## 2016-12-14 DIAGNOSIS — Z720 Tobacco use: Secondary | ICD-10-CM | POA: Diagnosis not present

## 2016-12-14 LAB — LIPID PANEL
CHOL/HDL RATIO: 4.1 ratio (ref <5.0)
CHOLESTEROL: 204 mg/dL — AB (ref <200)
HDL: 50 mg/dL (ref >40)
LDL Cholesterol: 136 mg/dL — ABNORMAL HIGH (ref <100)
TRIGLYCERIDES: 92 mg/dL (ref <150)
VLDL: 18 mg/dL (ref <30)

## 2016-12-14 LAB — HEPATIC FUNCTION PANEL
ALK PHOS: 68 U/L (ref 40–115)
ALT: 12 U/L (ref 9–46)
AST: 16 U/L (ref 10–35)
Albumin: 4 g/dL (ref 3.6–5.1)
BILIRUBIN INDIRECT: 0.4 mg/dL (ref 0.2–1.2)
Bilirubin, Direct: 0.1 mg/dL (ref <=0.2)
TOTAL PROTEIN: 6.6 g/dL (ref 6.1–8.1)
Total Bilirubin: 0.5 mg/dL (ref 0.2–1.2)

## 2016-12-18 ENCOUNTER — Other Ambulatory Visit: Payer: Self-pay

## 2016-12-18 DIAGNOSIS — E7800 Pure hypercholesterolemia, unspecified: Secondary | ICD-10-CM

## 2016-12-18 DIAGNOSIS — I2510 Atherosclerotic heart disease of native coronary artery without angina pectoris: Secondary | ICD-10-CM

## 2016-12-18 MED ORDER — EZETIMIBE 10 MG PO TABS: 10.0000 mg | ORAL_TABLET | Freq: Every day | ORAL | 3 refills | Status: DC

## 2016-12-19 ENCOUNTER — Other Ambulatory Visit: Payer: Self-pay

## 2016-12-19 DIAGNOSIS — E7800 Pure hypercholesterolemia, unspecified: Secondary | ICD-10-CM

## 2017-01-20 ENCOUNTER — Other Ambulatory Visit: Payer: Self-pay | Admitting: Cardiology

## 2017-02-24 NOTE — Progress Notes (Signed)
CARDIOLOGY OFFICE NOTE  Date:  02/28/2017    Jason Maldonado Date of Birth: [birth year] Medical Record #[id number]  PCP:  Garnetta Buddy, PA  Cardiologist:  [place]    Chief Complaint  Patient presents with  . Follow-up  . Coronary Artery Disease    History of Present Illness: Jason Maldonado is a 61 y.o. male who is seen for follow up CAD.  He has a past medical history of hyperlipidemia, bipolar disorder, tobacco abuse, and a history of CAD status post previous inferior MI in 2014 with PCI to the RCA.   He presented to [facility] on 03/10/2015 as a inferior STEMI in the setting of RCA in-stent restenosis/occlusion. Cardiac catheterization on 03/10/2015 showed 100% stenosis of prox and mid RCA prox to a previously placed unknown stent, 4.038 mm Resolute DES stent postdilated to 4.41 mm proximally and 4.1 mm distally. 50% residual RPDA, 40% 1st RPLB, 40%distal RCA lesion. Post cath, he was placed on aspirin and Brilinta. Echo EF 30-35%, basal to mid inferior akinesis, inferoseptal and inferolateral severe hypokinesis. He did have refractory hypotension that gradually improved. Repeat Echo in October 2016 showed inferior HK with EF 50-55%.   On follow up today he is feeling well. He is still smoking 5 cigs/day. He is active at work and  denies any chest pain, SOB, palpitations, or edema. Energy level is good.  He did develop severe arthralgias on lipitor. He is now on Livalo 2 mg and Zeita 10 mg daily and tolerates these well.    Past Medical History:  Diagnosis Date  . Bipolar 1 disorder (HCC)   . CAD (coronary artery disease)    a. STEMI 09/2012  single vessel occlusive CAD of the RCA s/p DES to mid RCA, EF 45%;  b. inferior STEMI 03/10/2015 DES to prox and mid RCA  . Depression   . Hyperlipidemia   . LV dysfunction    a. 09/2012 Cath LV gram severe basal to midinferior wall hypokinesis, EF 45% and echo EF 55%.  Marland Kitchen PAF (paroxysmal atrial fibrillation) (HCC)    a.  noted on admission 02/2015 in setting of inf stemi;  b. CHA2DS2VASc = 1.  . Tobacco abuse     Past Surgical History:  Procedure Laterality Date  . CARDIAC CATHETERIZATION N/A 03/10/2015   Procedure: Left Heart Cath and Coronary Angiography;  Surgeon: Lennette Bihari, MD;  Location: [facility] INVASIVE CV LAB;  Service: Cardiovascular;  Laterality: N/A;  . CARDIAC CATHETERIZATION  03/10/2015   Procedure: Coronary Stent Intervention;  Surgeon: Lennette Bihari, MD;  Location: [facility] INVASIVE CV LAB;  Service: Cardiovascular;;  . LEFT HEART CATHETERIZATION WITH CORONARY ANGIOGRAM N/A 09/27/2012   Procedure: LEFT HEART CATHETERIZATION WITH CORONARY ANGIOGRAM;  Surgeon: Aubrei Bouchie M [place], MD;  Location: [facility] CATH LAB;  Service: Cardiovascular;  Laterality: N/A;     Medications: Current Outpatient Prescriptions  Medication Sig Dispense Refill  . ALPRAZolam (XANAX) 0.5 MG tablet TAKE 1/2 TO 1 TABLET BY MOUTH EVERY 8 HOURS AS NEEDED FOR ANXIETY  0  . aspirin EC 81 MG EC tablet Take 1 tablet (81 mg total) by mouth daily.    Marland Kitchen buPROPion (WELLBUTRIN) 75 MG tablet Take 75 mg by mouth daily.  1  . carvedilol (COREG) 3.125 MG tablet TAKE 1 TABLET BY MOUTH TWICE A DAY 180 tablet 2  . ezetimibe (ZETIA) 10 MG tablet Take 1 tablet (10 mg total) by mouth daily. 90 tablet 3  . loratadine (CLARITIN)  10 MG tablet Take 10 mg by mouth daily.    Marland Kitchen. NITROSTAT 0.4 MG SL tablet PLACE 1 TABLET UNDER TONGUE EVERY 5 MINUTES IF NEEDED FOR CHEST PAIN. UP TO 3 DOSES 25 tablet 1  . Pitavastatin Calcium (LIVALO) 2 MG TABS Take 1 tablet (2 mg total) by mouth daily. 30 tablet 11  . ticagrelor (BRILINTA) 60 MG TABS tablet Take 1 tablet (60 mg total) by mouth 2 (two) times daily. 180 tablet 3   No current facility-administered medications for this visit.     Allergies: No Known Allergies  Social History: The patient  reports that he has been smoking Cigarettes.  He has a 40.00 pack-year smoking history. He has never used smokeless tobacco. He  reports that he does not drink alcohol or use drugs.   Family History: The patient's family history includes CAD in his father.   Review of Systems: Please see the history of present illness.    All other systems are reviewed and negative.   Physical Exam: VS:  BP (!) 144/92   Pulse 79   Ht 5' 7.5" (1.715 m)   Wt 154 lb (69.9 kg)   BMI 23.76 kg/m  .  BMI Body mass index is 23.76 kg/m.  Wt Readings from Last 3 Encounters:  02/28/17 154 lb (69.9 kg)  11/09/16 154 lb 6.4 oz (70 kg)  10/11/16 156 lb 12.8 oz (71.1 kg)    General: Pleasant. Well developed, well nourished and in no acute distress. Color is chronically sallow.  HEENT: Normal.  Neck: Supple, no JVD, carotid bruits, or masses noted.  Cardiac: Regular rate and rhythm. +S3. No edema.  Respiratory:  Lungs are clear to auscultation bilaterally with normal work of breathing.  GI: Soft and nontender.  MS: No deformity or atrophy. Gait and ROM intact.  Skin: Warm and dry. Color is normal.  Neuro:  Strength and sensation are intact and no gross focal deficits noted.  Psych: Alert, appropriate and with normal affect.   LABORATORY DATA:  EKG:  EKG is not ordered today.    Lab Results  Component Value Date   WBC 9.2 08/03/2015   HGB 14.6 08/03/2015   HCT 43.2 08/03/2015   PLT 247 08/03/2015   GLUCOSE 91 08/03/2015   CHOL 204 (H) 12/14/2016   TRIG 92 12/14/2016   HDL 50 12/14/2016   LDLCALC 136 (H) 12/14/2016   ALT 12 12/14/2016   AST 16 12/14/2016   NA 140 08/03/2015   K 4.2 08/03/2015   CL 103 08/03/2015   CREATININE 1.25 08/03/2015   BUN 20 08/03/2015   CO2 25 08/03/2015   TSH 1.565 08/03/2015   PSA 1.30 08/03/2015   INR 1.18 09/27/2012   HGBA1C 5.7 (H) 09/27/2012    BNP (last 3 results) No results for input(s): BNP in the last 8760 hours.  ProBNP (last 3 results) No results for input(s): PROBNP in the last 8760 hours.   Other Studies Reviewed Today: Echo: 06/08/15:Study Conclusions  -  Procedure narrative: Limited transthoracic echocardiography for left ventricular function evaluation. Image quality was adequate. - Left ventricle: The cavity size was normal. Wall thickness was normal. Systolic function was normal. The estimated ejection fraction was in the range of 50% to 55%. There is inferior and inferoseptal hypokinesis.  Impressions:  - Limited echo for LV function. There is inferior and inferoseptal hypokinesis. LVEF has improved to 50-55%.  Assessment/Plan: 1. CAD s/p inferior MI with stenting in 2014. Recurrent STEMI in July 2016 with  instent restenosis/occlusion. S/p repeat DES. On DAPT. Would favor continuing this long term. Continue Brilinta 60 mg bid and ASA 81 mg daily. Continue low dose beta blocker. Encourage exercise and complete smoking cessation.  2. Tobacco abuse. Encourage efforts at complete smoking cessation. On Wellbutrin.  3. Ischemic cardiomyopathy in setting of acute MI. EF has recovered.   4. Hyperlipidemia. Intolerant of lipitor. Now on Livalo 2 mg and Zetia 10 mg daily. Will repeat lab work in August. If still not at goal will refer to lipid clinic for consideration of PCSK 9 inhibitor.   5. Bipolar disorder  I will follow up in 6 months.  Current medicines are reviewed with the patient today.  The patient does not have concerns regarding medicines other than what has been noted above.  The following changes have been made:  See above.  Labs/ tests ordered today include:    No orders of the defined types were placed in this encounter.    Disposition:   FU with me in 6 months.   Patient is agreeable to this plan and will call if any problems develop in the interim.   Signed: Marena Witts [place] MD, [facility]    02/28/2017 4:16 PM

## 2017-02-28 ENCOUNTER — Ambulatory Visit (INDEPENDENT_AMBULATORY_CARE_PROVIDER_SITE_OTHER): Payer: BLUE CROSS/BLUE SHIELD | Admitting: Cardiology

## 2017-02-28 ENCOUNTER — Encounter: Payer: Self-pay | Admitting: Cardiology

## 2017-02-28 VITALS — BP 144/92 | HR 79 | Ht 67.5 in | Wt 154.0 lb

## 2017-02-28 DIAGNOSIS — E7800 Pure hypercholesterolemia, unspecified: Secondary | ICD-10-CM | POA: Diagnosis not present

## 2017-02-28 DIAGNOSIS — Z720 Tobacco use: Secondary | ICD-10-CM

## 2017-02-28 DIAGNOSIS — I2510 Atherosclerotic heart disease of native coronary artery without angina pectoris: Secondary | ICD-10-CM

## 2017-02-28 MED ORDER — TICAGRELOR 60 MG PO TABS: 60.0000 mg | ORAL_TABLET | Freq: Two times a day (BID) | ORAL | 3 refills | Status: DC

## 2017-02-28 NOTE — Patient Instructions (Signed)
Continue your current therapy  Quit smoking  We will check your blood work in August.  I will see you in 6 months

## 2017-03-05 ENCOUNTER — Other Ambulatory Visit: Payer: Self-pay | Admitting: Physician Assistant

## 2017-03-05 DIAGNOSIS — R0982 Postnasal drip: Secondary | ICD-10-CM

## 2017-03-14 DIAGNOSIS — E7800 Pure hypercholesterolemia, unspecified: Secondary | ICD-10-CM | POA: Diagnosis not present

## 2017-03-14 LAB — HEPATIC FUNCTION PANEL
ALBUMIN: 4.3 g/dL (ref 3.6–5.1)
ALT: 15 U/L (ref 9–46)
AST: 16 U/L (ref 10–35)
Alkaline Phosphatase: 66 U/L (ref 40–115)
BILIRUBIN TOTAL: 0.7 mg/dL (ref 0.2–1.2)
Bilirubin, Direct: 0.1 mg/dL (ref <=0.2)
Indirect Bilirubin: 0.6 mg/dL (ref 0.2–1.2)
Total Protein: 6.6 g/dL (ref 6.1–8.1)

## 2017-03-14 LAB — LIPID PANEL
CHOL/HDL RATIO: 3.3 ratio (ref <5.0)
CHOLESTEROL: 156 mg/dL (ref <200)
HDL: 48 mg/dL (ref >40)
LDL Cholesterol: 90 mg/dL (ref <100)
Triglycerides: 91 mg/dL (ref <150)
VLDL: 18 mg/dL (ref <30)

## 2017-05-30 DIAGNOSIS — F331 Major depressive disorder, recurrent, moderate: Secondary | ICD-10-CM | POA: Diagnosis not present

## 2017-07-25 ENCOUNTER — Other Ambulatory Visit: Payer: Self-pay | Admitting: Cardiology

## 2017-07-25 NOTE — Telephone Encounter (Signed)
Rx(s) sent to pharmacy electronically.  

## 2017-10-08 ENCOUNTER — Ambulatory Visit: Payer: BLUE CROSS/BLUE SHIELD | Admitting: Cardiovascular Disease

## 2017-10-18 NOTE — Progress Notes (Signed)
CARDIOLOGY OFFICE NOTE  Date:  10/19/2017    Jason KochMichael J Maldonado Date of Birth: [birth year] Medical Record #[id number]  PCP:  Garnetta BuddyEnglish, Stephanie D, PA  Cardiologist:  [place]    Chief Complaint  Patient presents with  . Coronary Artery Disease    History of Present Illness: Jason Maldonado is a 62 y.o. male who is seen for follow up CAD.  He has a past medical history of hyperlipidemia, bipolar disorder, tobacco abuse, and a history of CAD status post previous inferior MI in 2014 with PCI to the RCA.   [facility]10/2015 as a inferior STEMI in the setting of RCA in-stent restenosis/occlusion. Cardiac catheterization on 03/10/2015 showed 100% stenosis of prox and mid RCA prox to a previously placed unknown stent, 4.038 mm Resolute DES stent postdilated to 4.41 mm proximally and 4.1 mm distally. 50% residual RPDA, 40% 1st RPLB, 40%distal RCA lesion. Post cath, he was placed on aspirin and Brilinta. Echo EF 30-35%, basal to mid inferior akinesis, inferoseptal and inferolateral severe hypokinesis. He did have refractory hypotension that gradually improved. Repeat Echo in October 2016 showed inferior HK with EF 50-55%.   On follow up today he is feeling well. He continues to smoke and states that is not going to change. He has some seasonal allergies. No chest pain or dyspnea. No palpitations. Is active at work but does little exercise outside of work. Has gained 5 lbs.   Past Medical History:  Diagnosis Date  . Bipolar 1 disorder (HCC)   . CAD (coronary artery disease)    a. STEMI 09/2012  single vessel occlusive CAD of the RCA s/p DES to mid RCA, EF 45%;  b. inferior STEMI 03/10/2015 DES to prox and mid RCA  . Depression   . Hyperlipidemia   . LV dysfunction    a. 09/2012 Cath LV gram severe basal to midinferior wall hypokinesis, EF 45% and echo EF 55%.  Marland Kitchen. PAF (paroxysmal atrial fibrillation) (HCC)    a. noted on admission 02/2015 in setting of inf stemi;  b.  CHA2DS2VASc = 1.  . Tobacco abuse     Past Surgical History:  Procedure Laterality Date  . CARDIAC CATHETERIZATION N/A 03/10/2015   Procedure: Left Heart Cath and Coronary Angiography;  Surgeon: Lennette Biharihomas A Kelly, MD;  [facility];  Service: Cardiovascular;  Laterality: N/A;  . CARDIAC CATHETERIZATION  03/10/2015   Procedure: Coronary Stent Intervention;  Surgeon: Lennette Biharihomas A Kelly, MD;  Location: [facility] INVASIVE CV LAB;  Service: Cardiovascular;;  . LEFT HEART CATHETERIZATION WITH CORONARY ANGIOGRAM N/A 09/27/2012   Procedure: LEFT HEART CATHETERIZATION WITH CORONARY ANGIOGRAM;  Surgeon: Peter M [place], MD;  [facility];  Service: Cardiovascular;  Laterality: N/A;     Medications: Current Outpatient Medications  Medication Sig Dispense Refill  . ALPRAZolam (XANAX) 0.5 MG tablet TAKE 1/2 TO 1 TABLET BY MOUTH EVERY 8 HOURS AS NEEDED FOR ANXIETY  0  . aspirin EC 81 MG EC tablet Take 1 tablet (81 mg total) by mouth daily.    Marland Kitchen. buPROPion (WELLBUTRIN) 75 MG tablet Take 75 mg by mouth daily.  1  . carvedilol (COREG) 3.125 MG tablet TAKE 1 TABLET BY MOUTH TWICE A DAY 180 tablet 2  . LIVALO 2 MG TABS TAKE 1 TABLET BY MOUTH EVERY DAY 30 tablet 4  . loratadine (CLARITIN) 10 MG tablet Take 10 mg by mouth daily.    Marland Kitchen. NITROSTAT 0.4 MG SL  tablet PLACE 1 TABLET UNDER TONGUE EVERY 5 MINUTES IF NEEDED FOR CHEST PAIN. UP TO 3 DOSES 25 tablet 1  . ticagrelor (BRILINTA) 60 MG TABS tablet Take 1 tablet (60 mg total) by mouth 2 (two) times daily. 180 tablet 3  . ezetimibe (ZETIA) 10 MG tablet Take 1 tablet (10 mg total) by mouth daily. 90 tablet 3   No current facility-administered medications for this visit.     Allergies: No Known Allergies  Social History: The patient  reports that he has been smoking cigarettes.  He has a 40.00 pack-year smoking history. he has never used smokeless tobacco. He reports that he does not drink alcohol or use drugs.   Family History: The patient's  family history includes CAD in his father.   Review of Systems: Please see the history of present illness.    All other systems are reviewed and negative.   Physical Exam: VS:  BP 140/82   Pulse 81   Wt 159 lb (72.1 kg)   BMI 24.54 kg/m  .  BMI Body mass index is 24.54 kg/m.  Wt Readings from Last 3 Encounters:  10/19/17 159 lb (72.1 kg)  02/28/17 154 lb (69.9 kg)  11/09/16 154 lb 6.4 oz (70 kg)   GENERAL:  Well appearing WM in NAD HEENT:  PERRL, EOMI, sclera are clear. Oropharynx is clear. NECK:  No jugular venous distention, carotid upstroke brisk and symmetric, no bruits, no thyromegaly or adenopathy LUNGS:  Clear to auscultation bilaterally CHEST:  Unremarkable HEART:  RRR,  PMI not displaced or sustained,S1 and S2 within normal limits, no S3, no S4: no clicks, no rubs, no murmurs ABD:  Soft, nontender. BS +, no masses or bruits. No hepatomegaly, no splenomegaly EXT:  2 + pulses throughout, no edema, no cyanosis no clubbing SKIN:  Warm and dry.  No rashes NEURO:  Alert and oriented x 3. Cranial nerves II through XII intact. PSYCH:  Cognitively intact     LABORATORY DATA:  EKG:  EKG is  ordered today. NSR with PACs. Old inferior infarct. I have personally reviewed and interpreted this study.    Lab Results  Component Value Date   WBC 9.2 08/03/2015   HGB 14.6 08/03/2015   HCT 43.2 08/03/2015   PLT 247 08/03/2015   GLUCOSE 91 08/03/2015   CHOL 156 03/14/2017   TRIG 91 03/14/2017   HDL 48 03/14/2017   LDLCALC 90 03/14/2017   ALT 15 03/14/2017   AST 16 03/14/2017   NA 140 08/03/2015   K 4.2 08/03/2015   CL 103 08/03/2015   CREATININE 1.25 08/03/2015   BUN 20 08/03/2015   CO2 25 08/03/2015   TSH 1.565 08/03/2015   PSA 1.30 08/03/2015   INR 1.18 09/27/2012   HGBA1C 5.7 (H) 09/27/2012    BNP (last 3 results) No results for input(s): BNP in the last 8760 hours.  ProBNP (last 3 results) No results for input(s): PROBNP in the last 8760 hours.   Other  Studies Reviewed Today: Echo: 06/08/15:Study Conclusions  - Procedure narrative: Limited transthoracic echocardiography for left ventricular function evaluation. Image quality was adequate. - Left ventricle: The cavity size was normal. Wall thickness was normal. Systolic function was normal. The estimated ejection fraction was in the range of 50% to 55%. There is inferior and inferoseptal hypokinesis.  Impressions:  - Limited echo for LV function. There is inferior and inferoseptal hypokinesis. LVEF has improved to 50-55%.  Assessment/Plan: 1. CAD s/p inferior MI with stenting in  2014. Recurrent STEMI in July 2016 with instent restenosis/occlusion. S/p repeat DES. On DAPT. Would favor continuing this long term. Continue Brilinta 60 mg bid and ASA 81 mg daily. Continue low dose beta blocker. Encourage exercise and complete smoking cessation.  2. Tobacco abuse. Encourage efforts at complete smoking cessation.  3. Ischemic cardiomyopathy in setting of acute MI. EF has recovered.   4. Hyperlipidemia. Intolerant of lipitor. Now on Livalo 2 mg and Zetia 10 mg daily. Tolerating it well. We discussed adding a PCSK 9 inhibitor for residual risk. He is happy with his current therapy. We will repeat labs in 6 months and if no better reconsider.   5. Bipolar disorder  I will follow up in 6 months.  Current medicines are reviewed with the patient today.  The patient does not have concerns regarding medicines other than what has been noted above.  The following changes have been made:  See above.  Labs/ tests ordered today include:    No orders of the defined types were placed in this encounter.    Disposition:   FU with me in 6 months.   Patient is agreeable to this plan and will call if any problems develop in the interim.   Signed: Peter [place] MD, [facility]    10/19/2017 4:17 PM

## 2017-10-19 ENCOUNTER — Encounter: Payer: Self-pay | Admitting: Cardiology

## 2017-10-19 ENCOUNTER — Ambulatory Visit: Payer: BLUE CROSS/BLUE SHIELD | Admitting: Cardiology

## 2017-10-19 VITALS — BP 140/82 | HR 81 | Wt 159.0 lb

## 2017-10-19 DIAGNOSIS — Z720 Tobacco use: Secondary | ICD-10-CM

## 2017-10-19 DIAGNOSIS — E7800 Pure hypercholesterolemia, unspecified: Secondary | ICD-10-CM

## 2017-10-19 DIAGNOSIS — I2510 Atherosclerotic heart disease of native coronary artery without angina pectoris: Secondary | ICD-10-CM | POA: Diagnosis not present

## 2017-10-19 MED ORDER — NITROGLYCERIN 0.4 MG SL SUBL: SUBLINGUAL_TABLET | SUBLINGUAL | 1 refills | Status: DC

## 2017-10-19 NOTE — Patient Instructions (Signed)
Continue your current therapy  Get the weight down some and exercise more  Stop smoking  I will see  You in 6 months with fasting lab

## 2017-10-19 NOTE — Addendum Note (Signed)
Addended by: Neoma LamingPUGH, CHERYL J on: 10/19/2017 04:27 PM   Modules accepted: Orders

## 2017-10-21 ENCOUNTER — Other Ambulatory Visit: Payer: Self-pay | Admitting: Cardiology

## 2017-11-14 ENCOUNTER — Encounter: Payer: Self-pay | Admitting: Physician Assistant

## 2017-11-14 DIAGNOSIS — F331 Major depressive disorder, recurrent, moderate: Secondary | ICD-10-CM | POA: Diagnosis not present

## 2017-11-16 ENCOUNTER — Other Ambulatory Visit: Payer: Self-pay | Admitting: Cardiology

## 2017-11-16 NOTE — Telephone Encounter (Signed)
Rx has been sent to the pharmacy electronically. ° °

## 2017-11-20 ENCOUNTER — Other Ambulatory Visit: Payer: Self-pay | Admitting: Cardiology

## 2017-11-20 NOTE — Telephone Encounter (Signed)
REFILL 

## 2018-02-14 ENCOUNTER — Other Ambulatory Visit: Payer: Self-pay | Admitting: Cardiology

## 2018-04-10 IMAGING — DX DG SHOULDER 2+V*L*
3 series · 3 of 3 positions shown · non-contrast
Comparison: None.

CLINICAL DATA: Bilateral shoulder pain

EXAM:
LEFT SHOULDER - 2+ VIEW

[shoulder ap]
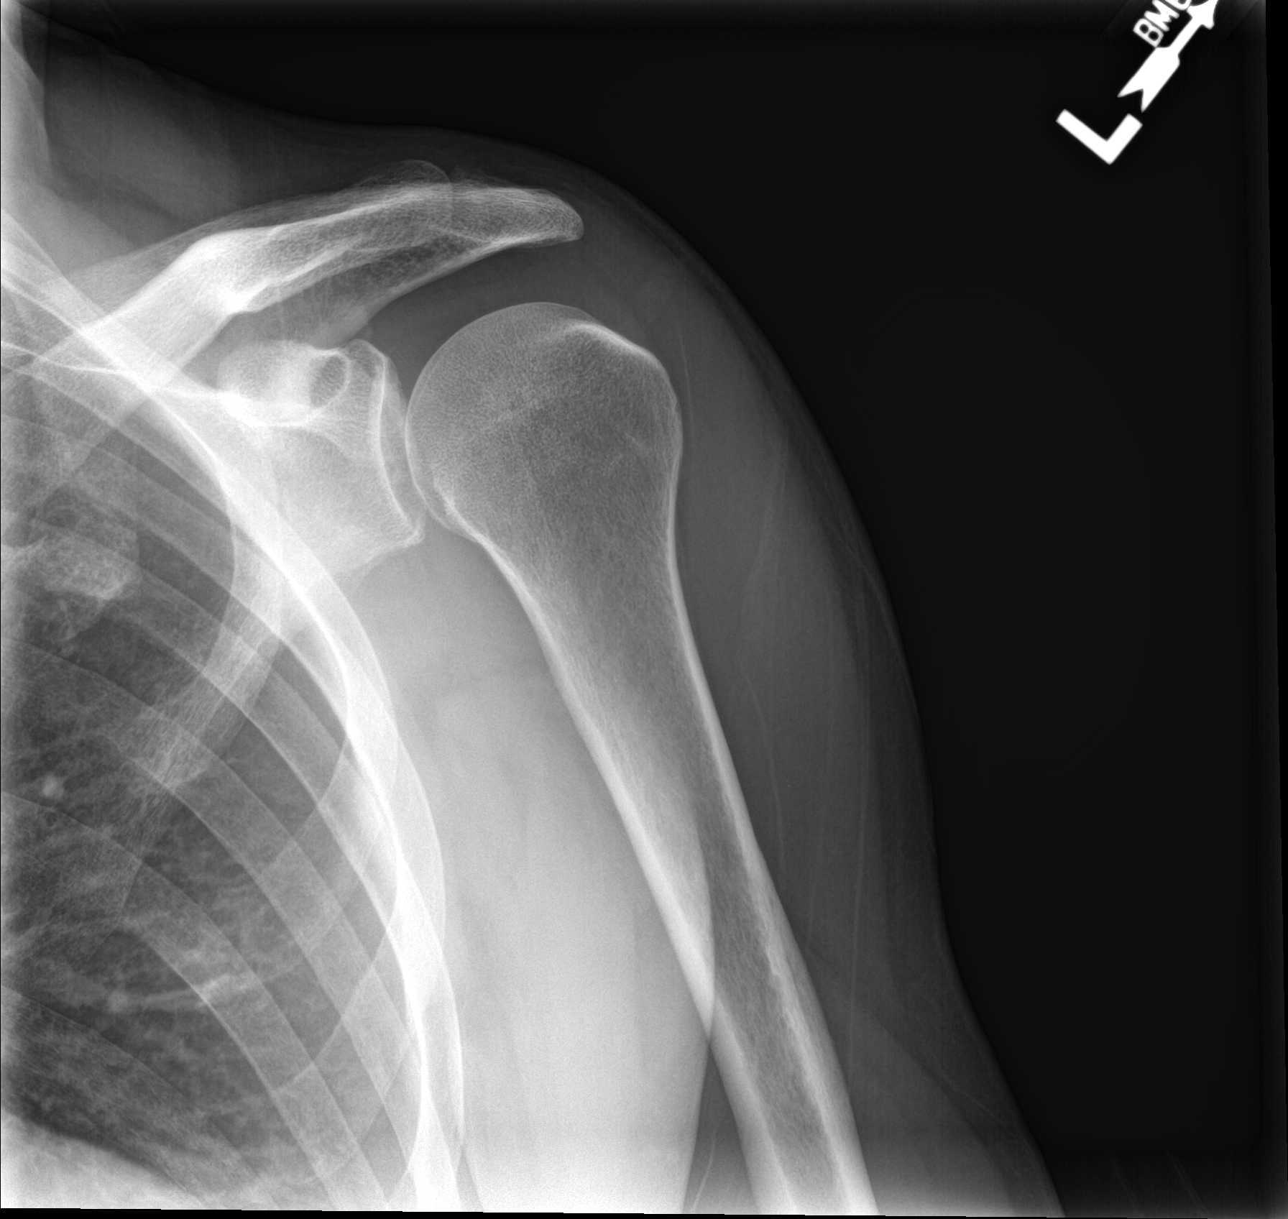

[shoulder y-view]
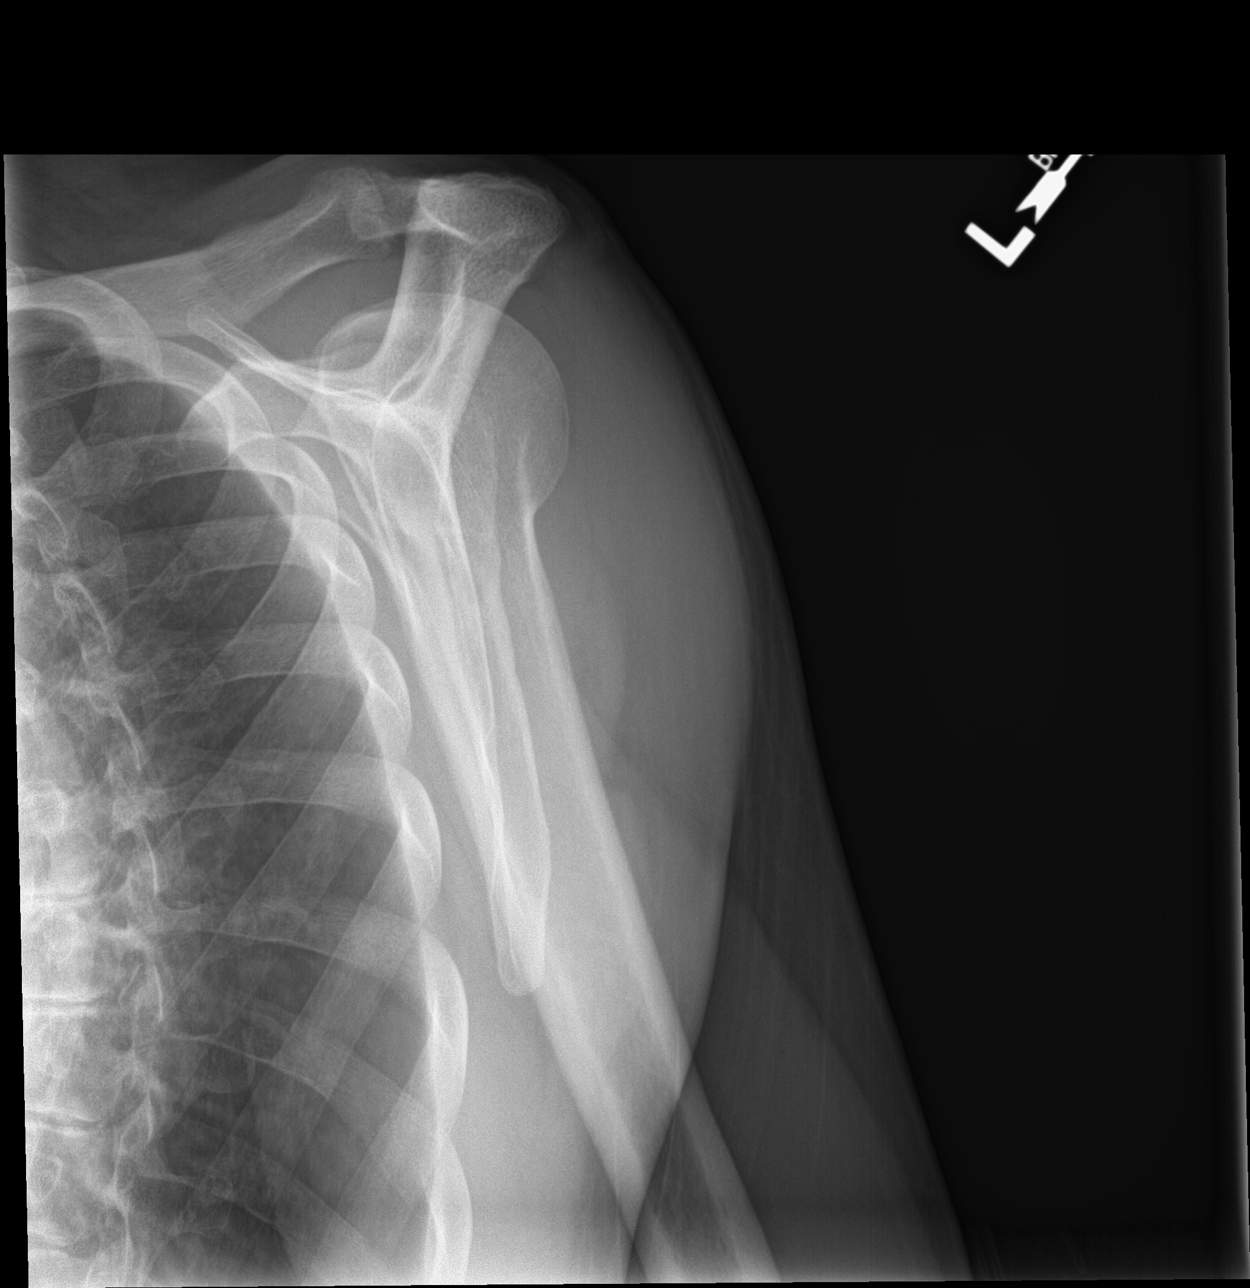

[shoulder axial]
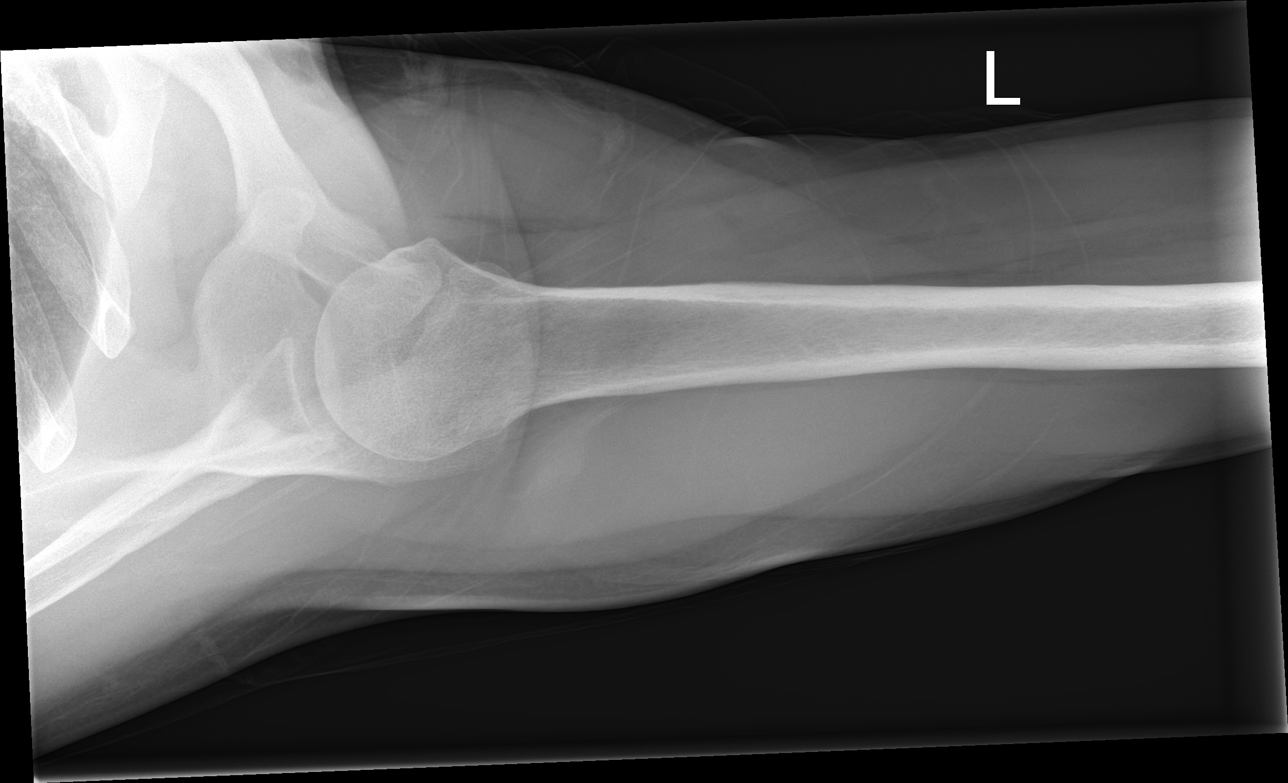

[3 of 3 positions shown; findings below may reference images not displayed]

FINDINGS: There is no evidence of fracture or dislocation. There is no
evidence of arthropathy or other focal bone abnormality. Soft
tissues are unremarkable.
IMPRESSION: Negative.

## 2018-05-16 ENCOUNTER — Other Ambulatory Visit: Payer: Self-pay | Admitting: Cardiology

## 2018-05-27 ENCOUNTER — Encounter: Payer: Self-pay | Admitting: Emergency Medicine

## 2018-05-27 DIAGNOSIS — F411 Generalized anxiety disorder: Secondary | ICD-10-CM | POA: Insufficient documentation

## 2018-05-27 DIAGNOSIS — F331 Major depressive disorder, recurrent, moderate: Secondary | ICD-10-CM

## 2018-06-03 ENCOUNTER — Ambulatory Visit: Payer: Self-pay | Admitting: Physician Assistant

## 2018-08-20 ENCOUNTER — Encounter: Payer: Self-pay | Admitting: Physician Assistant

## 2018-08-20 ENCOUNTER — Ambulatory Visit: Payer: BLUE CROSS/BLUE SHIELD | Admitting: Physician Assistant

## 2018-08-20 DIAGNOSIS — F411 Generalized anxiety disorder: Secondary | ICD-10-CM

## 2018-08-20 DIAGNOSIS — F331 Major depressive disorder, recurrent, moderate: Secondary | ICD-10-CM

## 2018-08-20 MED ORDER — BUPROPION HCL 75 MG PO TABS: 75.0000 mg | ORAL_TABLET | Freq: Every day | ORAL | 0 refills | Status: DC

## 2018-08-20 NOTE — Progress Notes (Signed)
[facility] Med Check  Patient ID: Jason Maldonado,  MRN: [id number]  PCP: Patient, No Pcp Per  Date of Evaluation: 08/20/2018 Time spent:15 minutes  Chief Complaint:  Chief Complaint    Follow-up      HISTORY/CURRENT STATUS: HPI Here for routine med check.  States he is doing well.  Work is going fine.  He is not missing any days.  He feels that the medication is working well.Patient denies loss of interest in usual activities and is able to enjoy things.  Denies decreased energy or motivation.  Appetite has not changed.  No extreme sadness, tearfulness, or feelings of hopelessness.  Denies any changes in concentration, making decisions or remembering things.  Denies suicidal or homicidal thoughts. He sleeps good.  Individual Medical History/ Review of Systems: Changes? :No    Past medications for mental health diagnoses include: Wellbutrin, Xanax, Cymbalta, symbyax, Seroquel, Latuda, Lamictal, Depakote, Risperdal, Prozac, Effexor, Abilify  Allergies: Patient has no known allergies.  Current Medications:  Current Outpatient Medications:  .  aspirin EC 81 MG EC tablet, Take 1 tablet (81 mg total) by mouth daily., Disp: , Rfl:  .  BRILINTA 60 MG TABS tablet, TAKE 1 TABLET BY MOUTH TWICE A DAY, Disp: 180 tablet, Rfl: 3 .  buPROPion (WELLBUTRIN) 75 MG tablet, Take 75 mg by mouth daily., Disp: , Rfl: 1 .  carvedilol (COREG) 3.125 MG tablet, TAKE 1 TABLET BY MOUTH TWICE A DAY, Disp: 180 tablet, Rfl: 3 .  ezetimibe (ZETIA) 10 MG tablet, TAKE 1 TABLET BY MOUTH EVERY DAY, Disp: 90 tablet, Rfl: 2 .  LIVALO 2 MG TABS, TAKE 1 TABLET BY MOUTH EVERY DAY, Disp: 90 tablet, Rfl: 2 .  loratadine (CLARITIN) 10 MG tablet, Take 10 mg by mouth daily., Disp: , Rfl:  .  nitroGLYCERIN (NITROSTAT) 0.4 MG SL tablet, PLACE 1 TABLET UNDER TONGUE EVERY 5 MINUTES IF NEEDED FOR CHEST PAIN. UP TO 3 DOSES, Disp: 25 tablet, Rfl: 1 .  ALPRAZolam (XANAX) 0.5 MG tablet, TAKE 1/2 TO 1 TABLET BY MOUTH EVERY 8 HOURS  AS NEEDED FOR ANXIETY, Disp: , Rfl: 0 .  buPROPion (WELLBUTRIN) 75 MG tablet, Take 1 tablet (75 mg total) by mouth daily., Disp: 90 tablet, Rfl: 0 Medication Side Effects: none  Family Medical/ Social History: Changes? No  MENTAL HEALTH EXAM:  There were no vitals taken for this visit.There is no height or weight on file to calculate BMI.  General Appearance: Casual  Eye Contact:  Good  Speech:  Clear and Coherent  Volume:  Normal  Mood:  Euthymic  Affect:  Appropriate  Thought Process:  Goal Directed  Orientation:  Full (Time, Place, and Person)  Thought Content: Logical   Suicidal Thoughts:  No  Homicidal Thoughts:  No  Memory:  WNL  Judgement:  Good  Insight:  Good  Psychomotor Activity:  Normal  Concentration:  Concentration: Good  Recall:  Good  Fund of Knowledge: Good  Language: Good  Assets:  Desire for Improvement  ADL's:  Intact  Cognition: WNL  Prognosis:  Good    DIAGNOSES:    ICD-10-CM   1. Major depressive disorder, recurrent episode, moderate (HCC) F33.1   2. Generalized anxiety disorder F41.1     Receiving Psychotherapy: No    RECOMMENDATIONS: Continue Wellbutrin 75 mg daily.  It is working at this dose there is no need to increase. Continue Xanax as needed which he takes rarely. Return in 6 months or sooner as needed.   Melony Overly,  PA-C

## 2018-09-17 ENCOUNTER — Other Ambulatory Visit: Payer: Self-pay | Admitting: Cardiology

## 2018-10-11 IMAGING — DX DG CHEST 2V
2 series · 2 of 2 positions shown · non-contrast
Comparison: None.

CLINICAL DATA: Cough with difficulty breathing.

EXAM:
CHEST  2 VIEW

[chest pa]
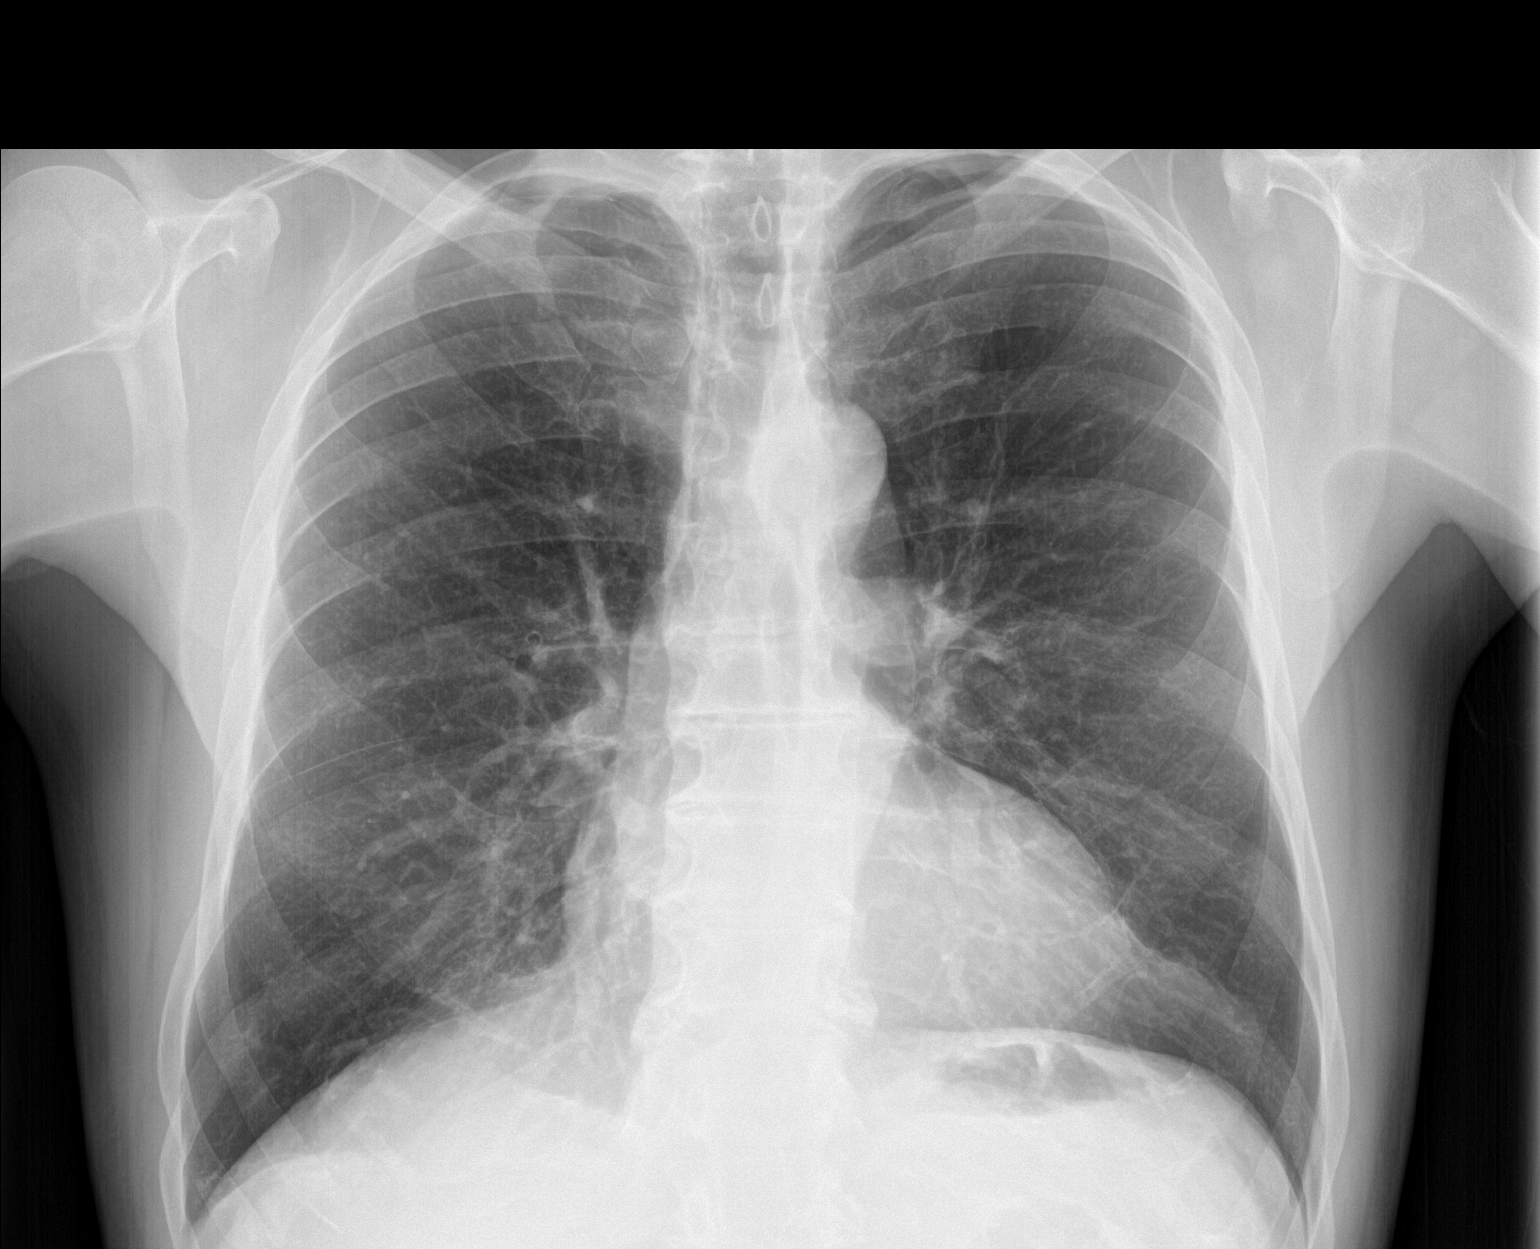

[chest lat]
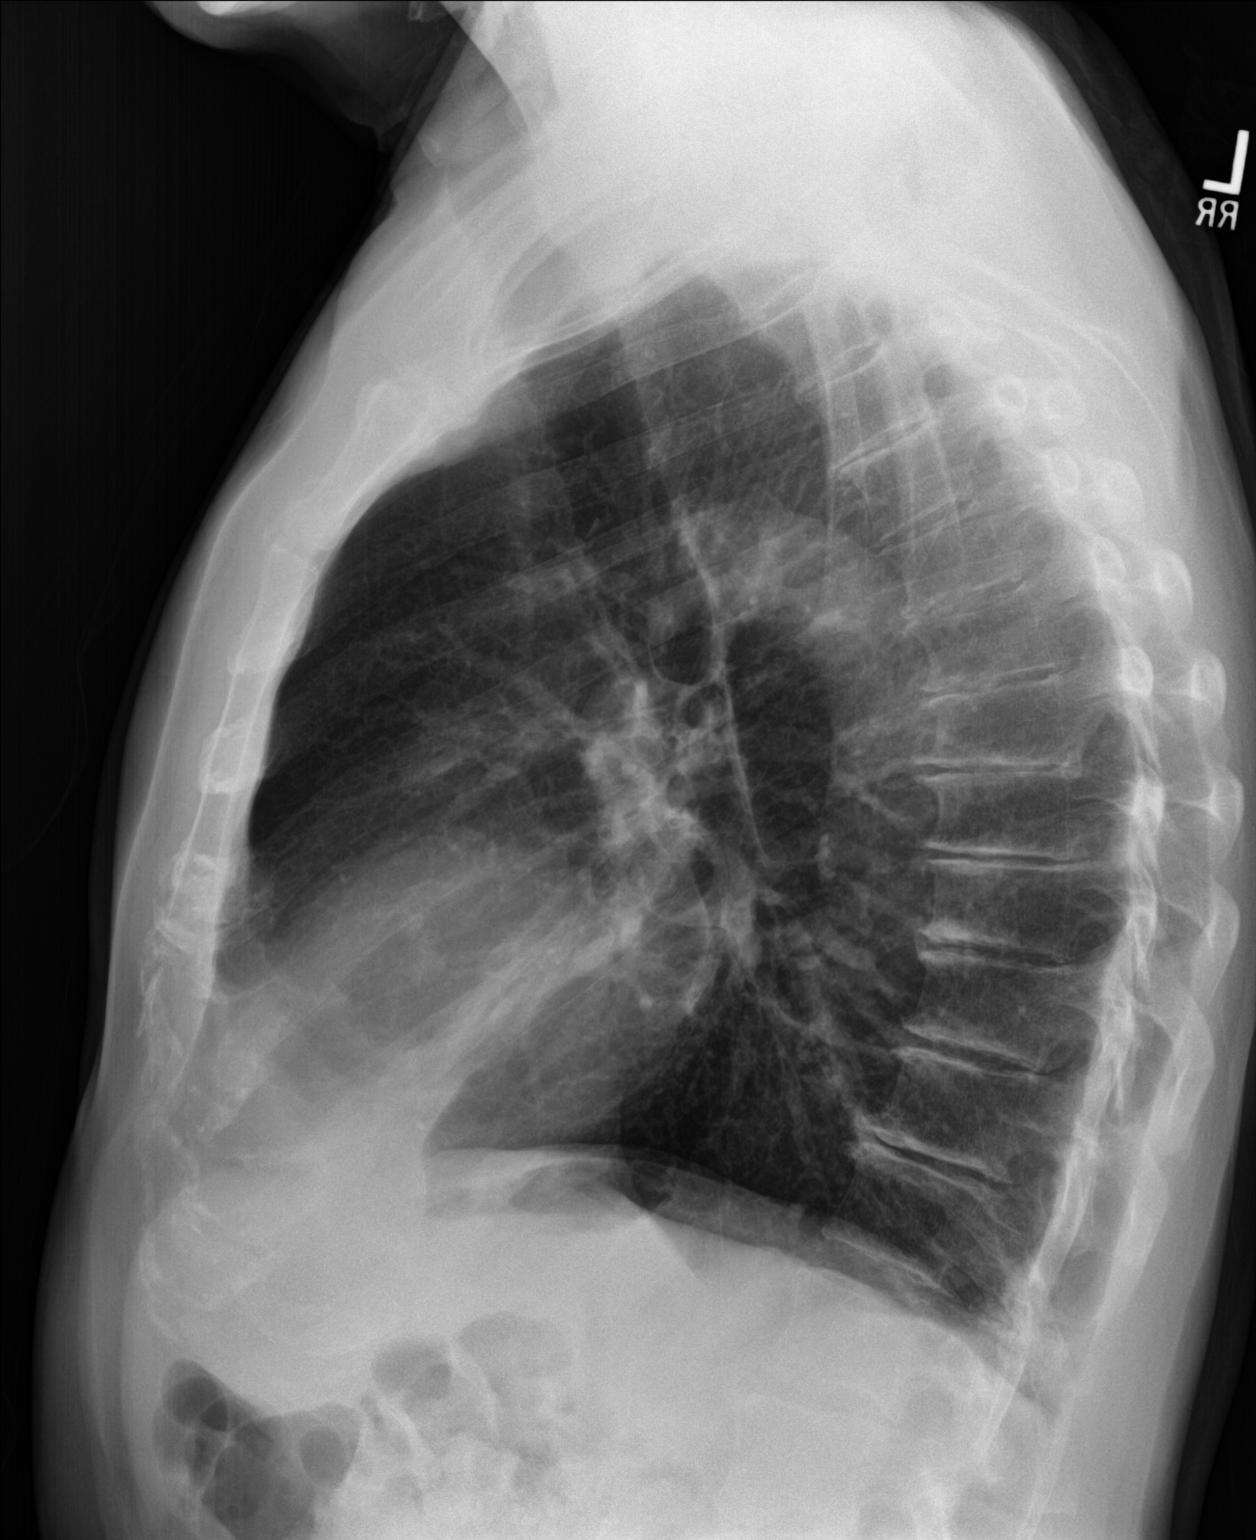

[2 of 2 positions shown; findings below may reference images not displayed]

FINDINGS: Lungs are hyperexpanded. Interstitial markings are diffusely
coarsened with chronic features. Cardiopericardial silhouette is at
upper limits of normal for size. The visualized bony structures of
the thorax are intact.
IMPRESSION: No active cardiopulmonary disease.

## 2018-10-24 ENCOUNTER — Other Ambulatory Visit: Payer: Self-pay

## 2018-10-24 MED ORDER — CARVEDILOL 3.125 MG PO TABS: 3.1250 mg | ORAL_TABLET | Freq: Two times a day (BID) | ORAL | 0 refills | Status: DC

## 2018-10-24 NOTE — Telephone Encounter (Signed)
Rx(s) sent to pharmacy electronically.  

## 2018-10-26 ENCOUNTER — Other Ambulatory Visit: Payer: Self-pay | Admitting: Cardiology

## 2018-11-20 ENCOUNTER — Other Ambulatory Visit: Payer: Self-pay | Admitting: Cardiology

## 2018-11-20 DIAGNOSIS — Z720 Tobacco use: Secondary | ICD-10-CM | POA: Diagnosis not present

## 2018-11-20 DIAGNOSIS — I2510 Atherosclerotic heart disease of native coronary artery without angina pectoris: Secondary | ICD-10-CM | POA: Diagnosis not present

## 2018-11-20 DIAGNOSIS — E7800 Pure hypercholesterolemia, unspecified: Secondary | ICD-10-CM | POA: Diagnosis not present

## 2018-11-20 LAB — HEPATIC FUNCTION PANEL
AG Ratio: 2 (calc) (ref 1.0–2.5)
ALT: 16 U/L (ref 9–46)
AST: 18 U/L (ref 10–35)
Albumin: 4.3 g/dL (ref 3.6–5.1)
Alkaline phosphatase (APISO): 55 U/L (ref 35–144)
Bilirubin, Direct: 0.1 mg/dL (ref 0.0–0.2)
Globulin: 2.2 g/dL (calc) (ref 1.9–3.7)
Indirect Bilirubin: 0.5 mg/dL (calc) (ref 0.2–1.2)
Total Bilirubin: 0.6 mg/dL (ref 0.2–1.2)
Total Protein: 6.5 g/dL (ref 6.1–8.1)

## 2018-11-20 LAB — BASIC METABOLIC PANEL WITH GFR
BUN/Creatinine Ratio: 15 (calc) (ref 6–22)
BUN: 20 mg/dL (ref 7–25)
CO2: 28 mmol/L (ref 20–32)
Calcium: 9.1 mg/dL (ref 8.6–10.3)
Chloride: 106 mmol/L (ref 98–110)
Creat: 1.36 mg/dL — ABNORMAL HIGH (ref 0.70–1.25)
GFR, Est African American: 63 mL/min/{1.73_m2} (ref >=60)
GFR, Est Non African American: 55 mL/min/{1.73_m2} — ABNORMAL LOW (ref >=60)
Glucose, Bld: 113 mg/dL — ABNORMAL HIGH (ref 65–99)
Potassium: 4.2 mmol/L (ref 3.5–5.3)
Sodium: 138 mmol/L (ref 135–146)

## 2018-11-20 LAB — LIPID PANEL
Cholesterol: 155 mg/dL (ref <200)
HDL: 44 mg/dL (ref >=40)
LDL Cholesterol (Calc): 95 mg/dL (calc)
Non-HDL Cholesterol (Calc): 111 mg/dL (calc) (ref <130)
Total CHOL/HDL Ratio: 3.5 (calc) (ref <5.0)
Triglycerides: 70 mg/dL (ref <150)

## 2018-11-21 ENCOUNTER — Telehealth: Payer: Self-pay | Admitting: Cardiology

## 2018-11-21 NOTE — Telephone Encounter (Signed)
Returned call to patient left message on personal voice mail keep appointment already scheduled with Dr.Jordan 04/08/19 at 4:00 pm.Advised to call sooner if needed.

## 2018-11-21 NOTE — Telephone Encounter (Signed)
Follow up:   Patient stated he did not need this appt and he doing well and will come in 08/20

## 2018-11-26 ENCOUNTER — Ambulatory Visit: Payer: BLUE CROSS/BLUE SHIELD | Admitting: Cardiology

## 2018-12-11 ENCOUNTER — Other Ambulatory Visit: Payer: Self-pay

## 2018-12-11 MED ORDER — PITAVASTATIN CALCIUM 2 MG PO TABS: 1.0000 | ORAL_TABLET | Freq: Every day | ORAL | 0 refills | Status: DC

## 2018-12-12 ENCOUNTER — Other Ambulatory Visit: Payer: Self-pay | Admitting: Physician Assistant

## 2018-12-12 ENCOUNTER — Other Ambulatory Visit: Payer: Self-pay | Admitting: Cardiology

## 2018-12-13 ENCOUNTER — Other Ambulatory Visit: Payer: Self-pay

## 2018-12-13 MED ORDER — PITAVASTATIN CALCIUM 2 MG PO TABS: 1.0000 | ORAL_TABLET | Freq: Every day | ORAL | 0 refills | Status: DC

## 2018-12-13 NOTE — Telephone Encounter (Signed)
Carvedilol refilled.

## 2019-02-17 ENCOUNTER — Other Ambulatory Visit: Payer: Self-pay

## 2019-02-17 MED ORDER — CARVEDILOL 3.125 MG PO TABS: 3.1250 mg | ORAL_TABLET | Freq: Two times a day (BID) | ORAL | 0 refills | Status: DC

## 2019-02-18 ENCOUNTER — Ambulatory Visit: Payer: BLUE CROSS/BLUE SHIELD | Admitting: Physician Assistant

## 2019-02-18 ENCOUNTER — Other Ambulatory Visit: Payer: Self-pay

## 2019-02-18 ENCOUNTER — Encounter: Payer: Self-pay | Admitting: Physician Assistant

## 2019-02-18 DIAGNOSIS — F331 Major depressive disorder, recurrent, moderate: Secondary | ICD-10-CM | POA: Diagnosis not present

## 2019-02-18 DIAGNOSIS — F411 Generalized anxiety disorder: Secondary | ICD-10-CM

## 2019-02-18 MED ORDER — BUPROPION HCL 75 MG PO TABS: 75.0000 mg | ORAL_TABLET | Freq: Every day | ORAL | 1 refills | Status: DC

## 2019-02-18 NOTE — Progress Notes (Signed)
[facility]  Patient ID: Jason Maldonado,  MRN: [id number]  PCP: Patient, No Pcp Per  Date of Evaluation: 02/18/2019 Time spent:15 minutes  Chief Complaint:  Chief Complaint    Follow-up      HISTORY/CURRENT STATUS: HPI Here for routine med check.   Irritable b/c "I'm an adult and I don't need anybody to tell me what to do.  I've been washing my hands and not touching my face for 30 years.  I don't need the government to tell me to wash my hands."   Patient denies loss of interest in usual activities and is able to enjoy things.  Denies decreased energy or motivation.  Appetite has not changed.  No extreme sadness, tearfulness, or feelings of hopelessness.  Denies any changes in concentration, making decisions or remembering things.  Denies suicidal or homicidal thoughts.  Rarely gets anxious.  Hasn't used Xanax in a long time.   Denies dizziness, syncope, seizures, numbness, tingling, tremor, tics, unsteady gait, slurred speech, confusion. Denies muscle or joint pain, stiffness, or dystonia.  Individual Medical History/ Review of Systems: Changes? :No    Past medications for mental health diagnoses include: Wellbutrin, Xanax, Cymbalta, symbyax, Seroquel, Latuda, Lamictal, Depakote, Risperdal, Prozac, Effexor, Abilify  Allergies: Patient has no known allergies.  Current Medications:  Current Outpatient Medications:  .  aspirin EC 81 MG EC tablet, Take 1 tablet (81 mg total) by mouth daily., Disp: , Rfl:  .  BRILINTA 60 MG TABS tablet, TAKE 1 TABLET BY MOUTH TWICE A DAY, Disp: 180 tablet, Rfl: 3 .  buPROPion (WELLBUTRIN) 75 MG tablet, TAKE 1 TABLET BY MOUTH EVERY DAY, Disp: 90 tablet, Rfl: 0 .  carvedilol (COREG) 3.125 MG tablet, Take 1 tablet (3.125 mg total) by mouth 2 (two) times daily with a meal., Disp: 180 tablet, Rfl: 0 .  ezetimibe (ZETIA) 10 MG tablet, TAKE 1 TABLET BY MOUTH EVERY DAY, Disp: 90 tablet, Rfl: 2 .  loratadine (CLARITIN) 10 MG tablet, Take 10  mg by mouth daily., Disp: , Rfl:  .  nitroGLYCERIN (NITROSTAT) 0.4 MG SL tablet, PLACE 1 TABLET UNDER TONGUE EVERY 5 MINUTES IF NEEDED FOR CHEST PAIN. UP TO 3 DOSES, Disp: 25 tablet, Rfl: 3 .  Pitavastatin Calcium (LIVALO) 2 MG TABS, Take 1 tablet (2 mg total) by mouth daily., Disp: 90 tablet, Rfl: 0 .  ALPRAZolam (XANAX) 0.5 MG tablet, TAKE 1/2 TO 1 TABLET BY MOUTH EVERY 8 HOURS AS NEEDED FOR ANXIETY, Disp: , Rfl: 0 Medication Side Effects: none  Family Medical/ Social History: Changes? No.  The coronavirus pandemic hasn't effected him at all.   MENTAL HEALTH EXAM:  There were no vitals taken for this visit.There is no height or weight on file to calculate BMI.  General Appearance: Casual  Eye Contact:  Good  Speech:  Clear and Coherent  Volume:  Normal  Mood:  Irritable  Affect:  Appropriate  Thought Process:  Goal Directed  Orientation:  Full (Time, Place, and Person)  Thought Content: Logical   Suicidal Thoughts:  No  Homicidal Thoughts:  No  Memory:  WNL  Judgement:  Good  Insight:  Good  Psychomotor Activity:  Normal  Concentration:  Concentration: Good  Recall:  Good  Fund of Knowledge: Good  Language: Good  Assets:  Desire for Improvement  ADL's:  Intact  Cognition: WNL  Prognosis:  Good    DIAGNOSES:    ICD-10-CM   1. Major depressive disorder, recurrent episode, moderate (HCC)  F33.1  2. Generalized anxiety disorder  F41.1     Receiving Psychotherapy: No    RECOMMENDATIONS:  Continue Wellbutrin 75 mg qd.  Continue Xanax prn.  Return in 6 months.  Melony Overlyeresa Lorrene Graef, PA-C   This record has been created using [employer] software.  Chart creation errors have been sought, but may not always have been located and corrected. Such creation errors do not reflect on the standard of medical care.

## 2019-03-13 ENCOUNTER — Other Ambulatory Visit: Payer: Self-pay | Admitting: Cardiology

## 2019-04-04 ENCOUNTER — Other Ambulatory Visit: Payer: Self-pay | Admitting: Cardiology

## 2019-04-06 ENCOUNTER — Other Ambulatory Visit: Payer: Self-pay | Admitting: Cardiology

## 2019-04-06 NOTE — Progress Notes (Signed)
Virtual Visit via Telephone Note   This visit type was conducted due to national recommendations for restrictions regarding the COVID-19 Pandemic (e.g. social distancing) in an effort to limit this patient's exposure and mitigate transmission in our community.  Due to his co-morbid illnesses, this patient is at least at moderate risk for complications without adequate follow up.  This format is felt to be most appropriate for this patient at this time.  The patient did not have access to video technology/had technical difficulties with video requiring transitioning to audio format only (telephone).  All issues noted in this document were discussed and addressed.  No physical exam could be performed with this format.  Please refer to the patient's chart for his  consent to telehealth for [facility]   Date:  04/08/2019   ID:  Jason Maldonado, Jason Maldonado 19-Oct-1955, MRN [id number]  Patient Location: Home Provider Location: Home  PCP:  Patient, No Pcp Per  Cardiologist:  Tanayia Wahlquist [place] MD Electrophysiologist:  None   Evaluation Performed:  Follow-Up Visit  Chief Complaint:  Follow up CAD  History of Present Illness:    Jason Maldonado is a 63 y.o. male with a past medical history of hyperlipidemia, bipolar disorder, tobacco abuse, and a history of CAD status post previous inferior MI in 2014 with PCI to the RCA.   He presented to [facility] on 03/10/2015 as a inferior STEMI in the setting of RCA in-stent restenosis/occlusion. Cardiac catheterization on 03/10/2015 showed 100% stenosis of prox and mid RCA prox to a previously placed unknown stent, 4.038 mm Resolute DES stent postdilated to 4.41 mm proximally and 4.1 mm distally. 50% residual RPDA, 40% 1st RPLB, 40%distal RCA lesion. Post cath, he was placed on aspirin and Brilinta. Echo EF 30-35%, basal to mid inferior akinesis, inferoseptal and inferolateral severe hypokinesis. He did have refractory hypotension that gradually improved.  Repeat Echo in October 2016 showed inferior HK with EF 50-55%. He is on long term DAPT.   He is feeling well now. He denies any chest pain, dyspnea, palpitations or dizziness. Notes some post herpetic neuralgia related to prior shingles. He is still smoking.   The patient does not have symptoms concerning for COVID-19 infection (fever, chills, cough, or new shortness of breath).    Past Medical History:  Diagnosis Date   Bipolar 1 disorder ([place])    CAD (coronary artery disease)    a. STEMI 09/2012  single vessel occlusive CAD of the RCA s/p DES to mid RCA, EF 45%;  b. inferior STEMI 03/10/2015 DES to prox and mid RCA   Depression    Hyperlipidemia    LV dysfunction    a. 09/2012 Cath LV gram severe basal to midinferior wall hypokinesis, EF 45% and echo EF 55%.   PAF (paroxysmal atrial fibrillation) ([place])    a. noted on admission 02/2015 in setting of inf stemi;  b. CHA2DS2VASc = 1.   Tobacco abuse    Past Surgical History:  Procedure Laterality Date   CARDIAC CATHETERIZATION N/A 03/10/2015   Procedure: Left Heart Cath and Coronary Angiography;  Surgeon: Troy Sine, MD;  Location: [place] [facility];  Service: Cardiovascular;  Laterality: N/A;   CARDIAC CATHETERIZATION  03/10/2015   Procedure: Coronary Stent Intervention;  Surgeon: Troy Sine, MD;  Location: [place] [facility];  Service: Cardiovascular;;   LEFT HEART CATHETERIZATION WITH CORONARY ANGIOGRAM N/A 09/27/2012   Procedure: LEFT HEART CATHETERIZATION WITH CORONARY ANGIOGRAM;  Surgeon: Reverie Vaquera M Martinique, MD;  Location: [facility];  Service: Cardiovascular;  Laterality: N/A;     Current Meds  Medication Sig   aspirin EC 81 MG EC tablet Take 1 tablet (81 mg total) by mouth daily.   BRILINTA 60 MG TABS tablet TAKE 1 TABLET BY MOUTH TWICE A DAY   buPROPion (WELLBUTRIN) 75 MG tablet Take 1 tablet (75 mg total) by mouth daily.   carvedilol (COREG) 3.125 MG tablet TAKE 1 TABLET (3.125 MG TOTAL) BY MOUTH 2 (TWO) TIMES  DAILY WITH A MEAL.   ezetimibe (ZETIA) 10 MG tablet Take 1 tablet (10 mg total) by mouth daily.   loratadine (CLARITIN) 10 MG tablet Take 10 mg by mouth daily.   nitroGLYCERIN (NITROSTAT) 0.4 MG SL tablet PLACE 1 TABLET UNDER TONGUE EVERY 5 MINUTES IF NEEDED FOR CHEST PAIN. UP TO 3 DOSES   Pitavastatin Calcium (LIVALO) 2 MG TABS Take 1 tablet (2 mg total) by mouth daily.   [DISCONTINUED] ezetimibe (ZETIA) 10 MG tablet TAKE 1 TABLET BY MOUTH EVERY DAY   [DISCONTINUED] Pitavastatin Calcium (LIVALO) 2 MG TABS Take 1 tablet (2 mg total) by mouth daily.     Allergies:   Patient has no known allergies.   Social History   Tobacco Use   Smoking status: Current Every Day Smoker    Packs/day: 0.50    Years: 40.00    Pack years: 20.00    Types: Cigarettes    Last attempt to quit: 04/15/2015    Years since quitting: 3.9   Smokeless tobacco: Never Used   Tobacco comment: using nicotine gum  Substance Use Topics   Alcohol use: Yes    Alcohol/week: 2.0 standard drinks    Types: 2 Standard drinks or equivalent per week   Drug use: No     Family Hx: The patient's family history includes CAD in his father.  ROS:   Please see the history of present illness.    All other systems reviewed and are negative.   Prior CV studies:   The following studies were reviewed today:  none  Labs/Other Tests and Data Reviewed:    EKG:  No ECG reviewed.  Recent Labs: 11/20/2018: ALT 16; BUN 20; Creat 1.36; Potassium 4.2; Sodium 138   Recent Lipid Panel Lab Results  Component Value Date/Time   CHOL 155 11/20/2018 08:04 AM   TRIG 70 11/20/2018 08:04 AM   HDL 44 11/20/2018 08:04 AM   CHOLHDL 3.5 11/20/2018 08:04 AM   LDLCALC 95 11/20/2018 08:04 AM    Wt Readings from Last 3 Encounters:  10/19/17 159 lb (72.1 kg)  02/28/17 154 lb (69.9 kg)  11/09/16 154 lb 6.4 oz (70 kg)     Objective:    Vital Signs:  Ht 5\' 7"  (1.702 m)    BMI 24.90 kg/m    VITAL SIGNS:  reviewed  ASSESSMENT &  PLAN:    1. CAD s/p inferior MI with stenting in 2014. Recurrent STEMI in July 2016 with instent restenosis/occlusion. S/p repeat DES. On DAPT. Would favor continuing this long term. Continue Brilinta 60 mg bid and ASA 81 mg daily. Continue low dose beta blocker. Unable to tolerate higher dose due to low BP.  Encourage exercise and complete smoking cessation.  2. Tobacco abuse. Encourage efforts at complete smoking cessation.   3. Ischemic cardiomyopathy in setting of acute MI. EF has recovered.   4. Hyperlipidemia. Intolerant of lipitor. Now on Livalo 2 mg and Zetia 10 mg daily. Tolerating it well. We discussed adding a  PCSK 9 inhibitor for residual risk but he doesn't want to change therapy.   5. Bipolar disorder  COVID-19 Education: The signs and symptoms of COVID-19 were discussed with the patient and how to seek care for testing (follow up with PCP or arrange E-visit).  The importance of social distancing was discussed today.  Time:   Today, I have spent 15 minutes with the patient with telehealth technology discussing the above problems.     Medication Adjustments/Labs and Tests Ordered: Current medicines are reviewed at length with the patient today.  Concerns regarding medicines are outlined above.   Tests Ordered: No orders of the defined types were placed in this encounter.   Medication Changes: Meds ordered this encounter  Medications   ezetimibe (ZETIA) 10 MG tablet    Sig: Take 1 tablet (10 mg total) by mouth daily.    Dispense:  90 tablet    Refill:  3   Pitavastatin Calcium (LIVALO) 2 MG TABS    Sig: Take 1 tablet (2 mg total) by mouth daily.    Dispense:  90 tablet    Refill:  3    Follow Up:  In Person in 1 year(s)  Signed, Jaysie Benthall [place], MD  04/08/2019 3:26 PM    [facility]

## 2019-04-07 ENCOUNTER — Ambulatory Visit: Payer: BLUE CROSS/BLUE SHIELD | Admitting: Cardiology

## 2019-04-08 ENCOUNTER — Telehealth (INDEPENDENT_AMBULATORY_CARE_PROVIDER_SITE_OTHER): Payer: BC Managed Care – PPO | Admitting: Cardiology

## 2019-04-08 ENCOUNTER — Encounter: Payer: Self-pay | Admitting: Cardiology

## 2019-04-08 VITALS — Ht 67.0 in

## 2019-04-08 DIAGNOSIS — Z720 Tobacco use: Secondary | ICD-10-CM

## 2019-04-08 DIAGNOSIS — I2510 Atherosclerotic heart disease of native coronary artery without angina pectoris: Secondary | ICD-10-CM | POA: Diagnosis not present

## 2019-04-08 DIAGNOSIS — E7800 Pure hypercholesterolemia, unspecified: Secondary | ICD-10-CM

## 2019-04-08 MED ORDER — LIVALO 2 MG PO TABS: 1.0000 | ORAL_TABLET | Freq: Every day | ORAL | 3 refills | Status: DC

## 2019-04-08 MED ORDER — EZETIMIBE 10 MG PO TABS: 10.0000 mg | ORAL_TABLET | Freq: Every day | ORAL | 3 refills | Status: DC

## 2019-04-08 NOTE — Patient Instructions (Signed)
Medication Instructions:  Continue same medications If you need a refill on your cardiac medications before your next appointment, please call your pharmacy.   Lab work: None ordered   Testing/Procedures: None ordered  Follow-Up: At [facility], you and your health needs are our priority.  As part of our continuing mission to provide you with exceptional heart care, we have created designated Provider Care Teams.  These Care Teams include your primary Cardiologist (physician) and Advanced Practice Providers (APPs -  Physician Assistants and Nurse Practitioners) who all work together to provide you with the care you need, when you need it. . Schedule follow up appointment in 1 year  Call in May to schedule August appointment    

## 2019-04-11 ENCOUNTER — Telehealth: Payer: Self-pay

## 2019-04-11 MED ORDER — LIVALO 2 MG PO TABS: 1.0000 | ORAL_TABLET | Freq: Every day | ORAL | 3 refills | Status: DC

## 2019-04-16 NOTE — Telephone Encounter (Signed)
Patient states pharmacy will not refill his script, the told him the insurance company is waiting on information from the provider.

## 2019-04-16 NOTE — Telephone Encounter (Signed)
Pt states that [facility] on [address] says the INS is now requiring him to have prior authorization and more information about why he is on the med from the MD.. he says he has been on it for 4 years and is very upset.. I advised him that we will have to contact the INS co and see if we can get it refilled for him.

## 2019-04-17 NOTE — Telephone Encounter (Signed)
He has been on pitivastatin since 2017. History of intolerance on lipitor.   Eydie Wormley [place] MD, [facility]

## 2019-04-18 NOTE — Telephone Encounter (Signed)
Called patient no answer.LMTC. 

## 2019-04-23 NOTE — Telephone Encounter (Signed)
Follow up    Patient is calling back about his prior authorization.

## 2019-04-24 NOTE — Telephone Encounter (Signed)
Spoke to patient 04/23/19.Advised I will call pharmacy and see what is needed to get Livalo approved.Advised I will leave samples at front desk.

## 2019-04-28 NOTE — Telephone Encounter (Signed)
Prior authorization obtained for Livalo.Insurance approved.Called patient left a message on personal voice mail approved.

## 2019-08-17 ENCOUNTER — Other Ambulatory Visit: Payer: Self-pay | Admitting: Physician Assistant

## 2019-08-21 ENCOUNTER — Ambulatory Visit (INDEPENDENT_AMBULATORY_CARE_PROVIDER_SITE_OTHER): Payer: BC Managed Care – PPO | Admitting: Physician Assistant

## 2019-08-21 ENCOUNTER — Other Ambulatory Visit: Payer: Self-pay

## 2019-08-21 ENCOUNTER — Encounter: Payer: Self-pay | Admitting: Physician Assistant

## 2019-08-21 DIAGNOSIS — F3342 Major depressive disorder, recurrent, in full remission: Secondary | ICD-10-CM

## 2019-08-21 NOTE — Progress Notes (Signed)
[facility]  Patient ID: Jason Maldonado,  MRN: [id number]  PCP: Patient, No Pcp Per  Date of Evaluation: 08/21/2019 Time spent:15 minutes  Chief Complaint:  Chief Complaint    Depression      HISTORY/CURRENT STATUS: HPI For routine med check.   Patient denies loss of interest in usual activities and is able to enjoy things.  Denies decreased energy or motivation.  Appetite has not changed.  No extreme sadness, tearfulness, or feelings of hopelessness.  Denies any changes in concentration, making decisions or remembering things.  Denies suicidal or homicidal thoughts.  Patient denies increased energy with decreased need for sleep, no increased talkativeness, no racing thoughts, no impulsivity or risky behaviors, no increased spending, no increased libido, no grandiosity.  Anxiety is well controlled and he never takes the Xanax. No panic attacks. He sleeps well.  Work is going well.   Denies dizziness, syncope, seizures, numbness, tingling, tremor, tics, unsteady gait, slurred speech, confusion. Denies muscle or joint pain, stiffness, or dystonia.  Individual Medical History/ Review of Systems: Changes? :No    Past medications for mental health diagnoses include: Wellbutrin, Xanax, Cymbalta, symbyax, Seroquel, Latuda, Lamictal, Depakote, Risperdal, Prozac, Effexor, Abilify  Allergies: Patient has no known allergies.  Current Medications:  Current Outpatient Medications:  .  aspirin EC 81 MG EC tablet, Take 1 tablet (81 mg total) by mouth daily., Disp: , Rfl:  .  BRILINTA 60 MG TABS tablet, TAKE 1 TABLET BY MOUTH TWICE A DAY, Disp: 180 tablet, Rfl: 3 .  buPROPion (WELLBUTRIN) 75 MG tablet, TAKE 1 TABLET BY MOUTH EVERY DAY, Disp: 90 tablet, Rfl: 0 .  carvedilol (COREG) 3.125 MG tablet, TAKE 1 TABLET (3.125 MG TOTAL) BY MOUTH 2 (TWO) TIMES DAILY WITH A MEAL., Disp: 180 tablet, Rfl: 0 .  ezetimibe (ZETIA) 10 MG tablet, Take 1 tablet (10 mg total) by mouth daily., Disp:  90 tablet, Rfl: 3 .  nitroGLYCERIN (NITROSTAT) 0.4 MG SL tablet, PLACE 1 TABLET UNDER TONGUE EVERY 5 MINUTES IF NEEDED FOR CHEST PAIN. UP TO 3 DOSES, Disp: 25 tablet, Rfl: 3 .  Pitavastatin Calcium (LIVALO) 2 MG TABS, Take 1 tablet (2 mg total) by mouth daily., Disp: 90 tablet, Rfl: 3 .  loratadine (CLARITIN) 10 MG tablet, Take 10 mg by mouth daily., Disp: , Rfl:  Medication Side Effects: none  Family Medical/ Social History: Changes? No  MENTAL HEALTH EXAM:  There were no vitals taken for this visit.There is no height or weight on file to calculate BMI.  General Appearance: Casual, Neat and Well Groomed  Eye Contact:  Good  Speech:  Clear and Coherent  Volume:  Normal  Mood:  Euthymic  Affect:  Appropriate  Thought Process:  Goal Directed  Orientation:  Full (Time, Place, and Person)  Thought Content: Logical   Suicidal Thoughts:  No  Homicidal Thoughts:  No  Memory:  WNL  Judgement:  Good  Insight:  Good  Psychomotor Activity:  Normal  Concentration:  Concentration: Good  Recall:  Good  Fund of Knowledge: Good  Language: Good  Assets:  Desire for Improvement  ADL's:  Intact  Cognition: WNL  Prognosis:  Good    DIAGNOSES:    ICD-10-CM   1. Recurrent major depressive disorder, in full remission (HCC)  F33.42     Receiving Psychotherapy: No    RECOMMENDATIONS:  PDMP reviewed.  Continue Wellbutrin 75 mg qd.  Return in 6 months.   Melony Overly, PA-C

## 2019-08-29 ENCOUNTER — Other Ambulatory Visit: Payer: Self-pay | Admitting: Cardiology

## 2019-11-20 ENCOUNTER — Other Ambulatory Visit: Payer: Self-pay | Admitting: Physician Assistant

## 2020-02-19 ENCOUNTER — Other Ambulatory Visit: Payer: Self-pay

## 2020-02-19 ENCOUNTER — Ambulatory Visit (INDEPENDENT_AMBULATORY_CARE_PROVIDER_SITE_OTHER): Payer: BC Managed Care – PPO | Admitting: Physician Assistant

## 2020-02-19 ENCOUNTER — Encounter: Payer: Self-pay | Admitting: Physician Assistant

## 2020-02-19 DIAGNOSIS — F3342 Major depressive disorder, recurrent, in full remission: Secondary | ICD-10-CM | POA: Diagnosis not present

## 2020-02-19 DIAGNOSIS — F411 Generalized anxiety disorder: Secondary | ICD-10-CM

## 2020-02-19 NOTE — Progress Notes (Addendum)
[facility]  Patient ID: Jason Maldonado,  MRN: [id number]  PCP: Patient, No Pcp Per  Date of Evaluation: 02/19/2020 Time spent:20 minutes  Chief Complaint:  Chief Complaint    Depression      HISTORY/CURRENT STATUS: HPI For routine med check.   States he's doing well.  "Nothing has changed with me." Not having much anxiety at all. "I don't worry about things I can't control."  Patient denies loss of interest in usual activities and is able to enjoy things.  Denies decreased energy or motivation. No extreme sadness, tearfulness, or feelings of hopelessness.  Denies any changes in concentration, making decisions or remembering things.  Denies suicidal or homicidal thoughts.  Patient denies increased energy with decreased need for sleep, no increased talkativeness, no racing thoughts, no impulsivity or risky behaviors, no increased spending, no increased libido, no grandiosity.  Denies dizziness, syncope, seizures, numbness, tingling, tremor, tics, unsteady gait, slurred speech, confusion. Denies muscle or joint pain, stiffness, or dystonia.  Individual Medical History/ Review of Systems: Changes? :No    Past medications for mental health diagnoses include: Wellbutrin, Xanax, Cymbalta, symbyax, Seroquel, Latuda, Lamictal, Depakote, Risperdal, Prozac, Effexor, Abilify  Allergies: Patient has no known allergies.  Current Medications:  Current Outpatient Medications:  .  aspirin EC 81 MG EC tablet, Take 1 tablet (81 mg total) by mouth daily., Disp: , Rfl:  .  BRILINTA 60 MG TABS tablet, TAKE 1 TABLET BY MOUTH TWICE A DAY, Disp: 180 tablet, Rfl: 3 .  buPROPion (WELLBUTRIN) 75 MG tablet, TAKE 1 TABLET BY MOUTH EVERY DAY, Disp: 90 tablet, Rfl: 1 .  carvedilol (COREG) 3.125 MG tablet, TAKE 1 TABLET (3.125 MG TOTAL) BY MOUTH 2 (TWO) TIMES DAILY WITH A MEAL., Disp: 180 tablet, Rfl: 2 .  ezetimibe (ZETIA) 10 MG tablet, Take 1 tablet (10 mg total) by mouth daily., Disp: 90  tablet, Rfl: 3 .  loratadine (CLARITIN) 10 MG tablet, Take 10 mg by mouth daily., Disp: , Rfl:  .  nitroGLYCERIN (NITROSTAT) 0.4 MG SL tablet, PLACE 1 TABLET UNDER TONGUE EVERY 5 MINUTES IF NEEDED FOR CHEST PAIN. UP TO 3 DOSES, Disp: 25 tablet, Rfl: 3 .  Pitavastatin Calcium (LIVALO) 2 MG TABS, Take 1 tablet (2 mg total) by mouth daily., Disp: 90 tablet, Rfl: 3 Medication Side Effects: none  Family Medical/ Social History: Changes? No  MENTAL HEALTH EXAM:  There were no vitals taken for this visit.There is no height or weight on file to calculate BMI.  General Appearance: Casual, Neat and Well Groomed  Eye Contact:  Good  Speech:  Clear and Coherent and Normal Rate  Volume:  Normal  Mood:  Euthymic  Affect:  Appropriate  Thought Process:  Goal Directed  Orientation:  Full (Time, Place, and Person)  Thought Content: Logical   Suicidal Thoughts:  No  Homicidal Thoughts:  No  Memory:  WNL  Judgement:  Good  Insight:  Good  Psychomotor Activity:  Normal  Concentration:  Concentration: Good and Attention Span: Good  Recall:  Good  Fund of Knowledge: Good  Language: Good  Assets:  Desire for Improvement  ADL's:  Intact  Cognition: WNL  Prognosis:  Good    DIAGNOSES:    ICD-10-CM   1. Recurrent major depressive disorder, in full remission (HCC)  F33.42   2. Generalized anxiety disorder  F41.1     Receiving Psychotherapy: No    RECOMMENDATIONS:  PDMP reviewed.  Continue Wellbutrin 75 mg qd.  Return in 6  months.   Melony Overly, PA-C

## 2020-03-03 ENCOUNTER — Other Ambulatory Visit: Payer: Self-pay | Admitting: Cardiology

## 2020-03-22 ENCOUNTER — Telehealth: Payer: Self-pay | Admitting: Cardiology

## 2020-03-22 DIAGNOSIS — E7800 Pure hypercholesterolemia, unspecified: Secondary | ICD-10-CM

## 2020-03-22 DIAGNOSIS — I2510 Atherosclerotic heart disease of native coronary artery without angina pectoris: Secondary | ICD-10-CM

## 2020-03-22 NOTE — Telephone Encounter (Signed)
    Pt would like to get order papers for his lab work. Pt would send to his address, also if to include direction where the lab is.

## 2020-03-22 NOTE — Telephone Encounter (Signed)
LVM for patient to return call to get follow up scheduled with Jordan from recall list 

## 2020-03-22 NOTE — Telephone Encounter (Signed)
Spoke to patient he stated he would like to have fasting lab before appointment 8/31 with Micah Flesher PA.Lab orders mailed.

## 2020-04-05 NOTE — Progress Notes (Deleted)
Cardiology Office Note:    Date:  04/05/2020   ID:  Jason Maldonado, Jason Maldonado 28-Dec-1955, MRN [id number]  PCP:  Jason Maldonado, No Pcp Per  Cardiologist:  Peter Swaziland, MD   Referring MD: No ref. provider found   No chief complaint on file. ***  History of Present Illness:    Jason Maldonado is a 64 y.o. male with a hx of          Past Medical History:  Diagnosis Date  . Bipolar 1 disorder (HCC)   . CAD (coronary artery disease)    a. STEMI 09/2012  single vessel occlusive CAD of the RCA s/p DES to mid RCA, EF 45%;  b. inferior STEMI 03/10/2015 DES to prox and mid RCA  . Depression   . Hyperlipidemia   . LV dysfunction    a. 09/2012 Cath LV gram severe basal to midinferior wall hypokinesis, EF 45% and echo EF 55%.  Marland Kitchen PAF (paroxysmal atrial fibrillation) (HCC)    a. noted on admission 02/2015 in setting of inf stemi;  b. CHA2DS2VASc = 1.  . Tobacco abuse     Past Surgical History:  Procedure Laterality Date  . CARDIAC CATHETERIZATION N/A 03/10/2015   Procedure: Left Heart Cath and Coronary Angiography;  Surgeon: Lennette Bihari, MD;  Location: [facility] INVASIVE CV LAB;  Service: Cardiovascular;  Laterality: N/A;  . CARDIAC CATHETERIZATION  03/10/2015   Procedure: Coronary Stent Intervention;  Surgeon: Lennette Bihari, MD;  Location: [facility] INVASIVE CV LAB;  Service: Cardiovascular;;  . LEFT HEART CATHETERIZATION WITH CORONARY ANGIOGRAM N/A 09/27/2012   Procedure: LEFT HEART CATHETERIZATION WITH CORONARY ANGIOGRAM;  Surgeon: [name], MD;  Location: [facility] CATH LAB;  Service: Cardiovascular;  Laterality: N/A;    Current Medications: No outpatient medications have been marked as taking for the 04/13/20 encounter (Appointment) with Jason Duster, PA.     Allergies:   Jason Maldonado has no known allergies.   Social History   Socioeconomic History  . Marital status: Married    Spouse name: Not on file  . Number of children: Not on file  . Years of education: Not on file  . Highest education  level: Not on file  Occupational History  . Occupation: Marine scientist  Tobacco Use  . Smoking status: Current Every Day Smoker    Packs/day: 0.50    Years: 40.00    Pack years: 20.00    Types: Cigarettes    Last attempt to quit: 04/15/2015    Years since quitting: 4.9  . Smokeless tobacco: Never Used  . Tobacco comment: using nicotine gum  Substance and Sexual Activity  . Alcohol use: Not Currently  . Drug use: No  . Sexual activity: Yes  Other Topics Concern  . Not on file  Social History Narrative   Lives in [place] by himself.  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  He does not routinely exercise.   Married.   Education: Academic librarian.   Exercise: Yes   Social Determinants of Health   Financial Resource Strain:   . Difficulty of Paying Living Expenses: Not on file  Food Insecurity:   . Worried About Programme researcher, broadcasting/film/video in the Last Year: Not on file  . Ran Out of Food in the Last Year: Not on file  Transportation Needs:   . Lack of Transportation (Medical): Not on file  . Lack of Transportation (Non-Medical): Not on file  Physical Activity:   . Days of Exercise per Week: Not on  file  . Minutes of Exercise per Session: Not on file  Stress:   . Feeling of Stress : Not on file  Social Connections:   . Frequency of Communication with Friends and Family: Not on file  . Frequency of Social Gatherings with Friends and Family: Not on file  . Attends Religious Services: Not on file  . Active Member of Clubs or Organizations: Not on file  . Attends Banker Meetings: Not on file  . Marital Status: Not on file     Family History: The Jason Maldonado's ***family history includes CAD in his father.  ROS:   Please see the history of present illness.    *** All other systems reviewed and are negative.  EKGs/Labs/Other Studies Reviewed:    The following studies were reviewed today: ***  EKG:  EKG is *** ordered today.  The ekg ordered today demonstrates ***  Recent  Labs: No results found for requested labs within last 8760 hours.  Recent Lipid Panel    Component Value Date/Time   CHOL 155 11/20/2018 0804   TRIG 70 11/20/2018 0804   HDL 44 11/20/2018 0804   CHOLHDL 3.5 11/20/2018 0804   VLDL 18 03/14/2017 0906   LDLCALC 95 11/20/2018 0804    Physical Exam:    VS:  There were no vitals taken for this visit.    Wt Readings from Last 3 Encounters:  10/19/17 159 lb (72.1 kg)  02/28/17 154 lb (69.9 kg)  11/09/16 154 lb 6.4 oz (70 kg)     GEN: *** Well nourished, well developed in no acute distress HEENT: Normal NECK: No JVD; No carotid bruits LYMPHATICS: No lymphadenopathy CARDIAC: ***RRR, no murmurs, rubs, gallops RESPIRATORY:  Clear to auscultation without rales, wheezing or rhonchi  ABDOMEN: Soft, non-tender, non-distended MUSCULOSKELETAL:  No edema; No deformity  SKIN: Warm and dry NEUROLOGIC:  Alert and oriented x 3 PSYCHIATRIC:  Normal affect   ASSESSMENT:    No diagnosis found. PLAN:    In order of problems listed above:  No diagnosis found.   Medication Adjustments/Labs and Tests Ordered: Current medicines are reviewed at length with the Jason Maldonado today.  Concerns regarding medicines are outlined above.  No orders of the defined types were placed in this encounter.  No orders of the defined types were placed in this encounter.   Signed, Jason Maldonado, [place]  04/05/2020 4:49 PM    [facility]

## 2020-04-06 DIAGNOSIS — I2510 Atherosclerotic heart disease of native coronary artery without angina pectoris: Secondary | ICD-10-CM | POA: Diagnosis not present

## 2020-04-06 DIAGNOSIS — E7800 Pure hypercholesterolemia, unspecified: Secondary | ICD-10-CM | POA: Diagnosis not present

## 2020-04-06 LAB — LIPID PANEL
Chol/HDL Ratio: 4.1 ratio (ref 0.0–5.0)
Cholesterol, Total: 183 mg/dL (ref 100–199)
HDL: 45 mg/dL (ref >39)
LDL Chol Calc (NIH): 116 mg/dL — ABNORMAL HIGH (ref 0–99)
Triglycerides: 122 mg/dL (ref 0–149)
VLDL Cholesterol Cal: 22 mg/dL (ref 5–40)

## 2020-04-06 LAB — HEPATIC FUNCTION PANEL
ALT: 20 IU/L (ref 0–44)
AST: 19 IU/L (ref 0–40)
Albumin: 4.5 g/dL (ref 3.8–4.8)
Alkaline Phosphatase: 60 IU/L (ref 48–121)
Bilirubin Total: 0.6 mg/dL (ref 0.0–1.2)
Bilirubin, Direct: 0.17 mg/dL (ref 0.00–0.40)
Total Protein: 6.6 g/dL (ref 6.0–8.5)

## 2020-04-06 LAB — BASIC METABOLIC PANEL
BUN/Creatinine Ratio: 15 (ref 10–24)
BUN: 17 mg/dL (ref 8–27)
CO2: 21 mmol/L (ref 20–29)
Calcium: 9.3 mg/dL (ref 8.6–10.2)
Chloride: 103 mmol/L (ref 96–106)
Creatinine, Ser: 1.13 mg/dL (ref 0.76–1.27)
GFR calc Af Amer: 78 mL/min/{1.73_m2} (ref >59)
GFR calc non Af Amer: 67 mL/min/{1.73_m2} (ref >59)
Glucose: 92 mg/dL (ref 65–99)
Potassium: 5.1 mmol/L (ref 3.5–5.2)
Sodium: 139 mmol/L (ref 134–144)

## 2020-04-13 ENCOUNTER — Ambulatory Visit: Payer: Self-pay | Admitting: Physician Assistant

## 2020-04-13 DIAGNOSIS — H34832 Tributary (branch) retinal vein occlusion, left eye, with macular edema: Secondary | ICD-10-CM | POA: Diagnosis not present

## 2020-04-25 NOTE — Progress Notes (Signed)
CARDIOLOGY OFFICE NOTE  Date:  04/27/2020    Jason Maldonado Date of Birth: 1955/12/29 Medical Record #[id number]  PCP:  Patient, No Pcp Per  Cardiologist:  [place]    No chief complaint on file.   History of Present Illness: Jason Maldonado is a 64 y.o. male who is seen for follow up CAD.  He has a past medical history of hyperlipidemia, bipolar disorder, tobacco abuse, and a history of CAD status post previous inferior MI in 2014 with PCI to the RCA.   He presented to [facility] on 03/10/2015 as a inferior STEMI in the setting of RCA in-stent restenosis/occlusion. Cardiac catheterization on 03/10/2015 showed 100% stenosis of prox and mid RCA prox to a previously placed unknown stent, 4.038 mm Resolute DES stent postdilated to 4.41 mm proximally and 4.1 mm distally. 50% residual RPDA, 40% 1st RPLB, 40%distal RCA lesion. Post cath, he was placed on aspirin and Brilinta. Echo EF 30-35%, basal to mid inferior akinesis, inferoseptal and inferolateral severe hypokinesis. He did have refractory hypotension that gradually improved. Repeat Echo in October 2016 showed inferior HK with EF 50-55%.   On follow up today he is feeling well. He continues to smoke. No plans to quit.  He has some seasonal allergies. No chest pain or dyspnea. No palpitations. Recently noted a vision change and was diagnosed with a central retinal vein occlusion. Will be seeing a retinal specialist.    Past Medical History:  Diagnosis Date  . Bipolar 1 disorder (HCC)   . CAD (coronary artery disease)    a. STEMI 09/2012  single vessel occlusive CAD of the RCA s/p DES to mid RCA, EF 45%;  b. inferior STEMI 03/10/2015 DES to prox and mid RCA  . Depression   . Hyperlipidemia   . LV dysfunction    a. 09/2012 Cath LV gram severe basal to midinferior wall hypokinesis, EF 45% and echo EF 55%.  Marland Kitchen PAF (paroxysmal atrial fibrillation) (HCC)    a. noted on admission 02/2015 in setting of inf stemi;  b. CHA2DS2VASc = 1.   . Tobacco abuse     Past Surgical History:  Procedure Laterality Date  . CARDIAC CATHETERIZATION N/A 03/10/2015   Procedure: Left Heart Cath and Coronary Angiography;  Surgeon: Lennette Bihari, MD;  Location: [facility] INVASIVE CV LAB;  Service: Cardiovascular;  Laterality: N/A;  . CARDIAC CATHETERIZATION  03/10/2015   Procedure: Coronary Stent Intervention;  Surgeon: Lennette Bihari, MD;  Location: [facility] INVASIVE CV LAB;  Service: Cardiovascular;;  . LEFT HEART CATHETERIZATION WITH CORONARY ANGIOGRAM N/A 09/27/2012   Procedure: LEFT HEART CATHETERIZATION WITH CORONARY ANGIOGRAM;  Surgeon: Serria Sloma M Swaziland, MD;  Location: [facility] CATH LAB;  Service: Cardiovascular;  Laterality: N/A;     Medications: Current Outpatient Medications  Medication Sig Dispense Refill  . aspirin EC 81 MG EC tablet Take 1 tablet (81 mg total) by mouth daily.    Marland Kitchen BRILINTA 60 MG TABS tablet TAKE 1 TABLET BY MOUTH TWICE A DAY 180 tablet 3  . buPROPion (WELLBUTRIN) 75 MG tablet TAKE 1 TABLET BY MOUTH EVERY DAY 90 tablet 1  . carvedilol (COREG) 3.125 MG tablet TAKE 1 TABLET (3.125 MG TOTAL) BY MOUTH 2 (TWO) TIMES DAILY WITH A MEAL. 180 tablet 2  . ezetimibe (ZETIA) 10 MG tablet Take 1 tablet (10 mg total) by mouth daily. 90 tablet 3  . loratadine (CLARITIN) 10 MG tablet Take 10 mg by mouth daily.    . nitroGLYCERIN (  NITROSTAT) 0.4 MG SL tablet PLACE 1 TABLET UNDER TONGUE EVERY 5 MINUTES IF NEEDED FOR CHEST PAIN. UP TO 3 DOSES 25 tablet 3  . Pitavastatin Calcium (LIVALO) 2 MG TABS Take 1 tablet (2 mg total) by mouth daily. 90 tablet 3   No current facility-administered medications for this visit.    Allergies: No Known Allergies  Social History: The patient  reports that he has been smoking cigarettes. He has a 20.00 pack-year smoking history. He has never used smokeless tobacco. He reports previous alcohol use. He reports that he does not use drugs.   Family History: The patient's family history includes CAD in his father.    Review of Systems: Please see the history of present illness.    All other systems are reviewed and negative.   Physical Exam: VS:  BP 140/88   Pulse 81   Ht 5\' 7"  (1.702 m)   Wt 151 lb (68.5 kg)   SpO2 96%   BMI 23.65 kg/m  .  BMI Body mass index is 23.65 kg/m.  Wt Readings from Last 3 Encounters:  04/27/20 151 lb (68.5 kg)  10/19/17 159 lb (72.1 kg)  02/28/17 154 lb (69.9 kg)   GENERAL:  Well appearing WM in NAD HEENT:  PERRL, EOMI, sclera are clear. Oropharynx is clear. NECK:  No jugular venous distention, carotid upstroke brisk and symmetric, no bruits, no thyromegaly or adenopathy LUNGS:  Clear to auscultation bilaterally CHEST:  Unremarkable HEART:  RRR,  PMI not displaced or sustained,S1 and S2 within normal limits, no S3, no S4: no clicks, no rubs, no murmurs ABD:  Soft, nontender. BS +, no masses or bruits. No hepatomegaly, no splenomegaly EXT:  2 + pulses throughout, no edema, no cyanosis no clubbing SKIN:  Warm and dry.  No rashes NEURO:  Alert and oriented x 3. Cranial nerves II through XII intact. PSYCH:  Cognitively intact     LABORATORY DATA:  EKG:  EKG is  ordered today. NSR with PACs. Old inferior infarct. I have personally reviewed and interpreted this study.    Lab Results  Component Value Date   WBC 9.2 08/03/2015   HGB 14.6 08/03/2015   HCT 43.2 08/03/2015   PLT 247 08/03/2015   GLUCOSE 92 04/06/2020   CHOL 183 04/06/2020   TRIG 122 04/06/2020   HDL 45 04/06/2020   LDLCALC 116 (H) 04/06/2020   ALT 20 04/06/2020   AST 19 04/06/2020   NA 139 04/06/2020   K 5.1 04/06/2020   CL 103 04/06/2020   CREATININE 1.13 04/06/2020   BUN 17 04/06/2020   CO2 21 04/06/2020   TSH 1.565 08/03/2015   PSA 1.30 08/03/2015   INR 1.18 09/27/2012   HGBA1C 5.7 (H) 09/27/2012    BNP (last 3 results) No results for input(s): BNP in the last 8760 hours.  ProBNP (last 3 results) No results for input(s): PROBNP in the last 8760 hours.   Other Studies  Reviewed Today: Echo: 06/08/15:Study Conclusions  - Procedure narrative: Limited transthoracic echocardiography for left ventricular function evaluation. Image quality was adequate. - Left ventricle: The cavity size was normal. Wall thickness was normal. Systolic function was normal. The estimated ejection fraction was in the range of 50% to 55%. There is inferior and inferoseptal hypokinesis.  Impressions:  - Limited echo for LV function. There is inferior and inferoseptal hypokinesis. LVEF has improved to 50-55%.  Assessment/Plan:  1. CAD s/p inferior MI with stenting in 2014. Recurrent STEMI in July 2016 with  instent restenosis/occlusion. S/p repeat DES. On DAPT. Would favor continuing this long term. Continue Brilinta 60 mg bid and ASA 81 mg daily. Continue low dose beta blocker. Unable to tolerate higher dose due to low BP.  Encourage exercise and complete smoking cessation. If he needs to hold Brilinta for an eye procedure this is OK.  2. Tobacco abuse. Encourage efforts at complete smoking cessation.   3. Ischemic cardiomyopathy in setting of acute MI. EF has recovered.   4. Hyperlipidemia. Intolerant of lipitor. Now on Livalo 2 mg and Zetia 10 mg daily.Tolerating it well. LDL is still not at goal. We discussed adding a PCSK 9 inhibitor for residual risk but he doesn't want to take this.  5.. Bipolar disorder.   Current medicines are reviewed with the patient today.  The patient does not have concerns regarding medicines other than what has been noted above.  The following changes have been made:  See above.  Labs/ tests ordered today include:    No orders of the defined types were placed in this encounter.    Disposition:   FU  in 6 months.  Signed: Temeka Pore Sw[place]iland MD, [facility]    04/27/2020 4:24 PM

## 2020-04-27 ENCOUNTER — Encounter: Payer: Self-pay | Admitting: Cardiology

## 2020-04-27 ENCOUNTER — Other Ambulatory Visit: Payer: Self-pay

## 2020-04-27 ENCOUNTER — Ambulatory Visit (INDEPENDENT_AMBULATORY_CARE_PROVIDER_SITE_OTHER): Payer: BC Managed Care – PPO | Admitting: Cardiology

## 2020-04-27 VITALS — BP 140/88 | HR 81 | Ht 67.0 in | Wt 151.0 lb

## 2020-04-27 DIAGNOSIS — Z720 Tobacco use: Secondary | ICD-10-CM | POA: Diagnosis not present

## 2020-04-27 DIAGNOSIS — E7800 Pure hypercholesterolemia, unspecified: Secondary | ICD-10-CM

## 2020-04-27 DIAGNOSIS — I2510 Atherosclerotic heart disease of native coronary artery without angina pectoris: Secondary | ICD-10-CM | POA: Diagnosis not present

## 2020-05-11 DIAGNOSIS — H43823 Vitreomacular adhesion, bilateral: Secondary | ICD-10-CM | POA: Diagnosis not present

## 2020-05-11 DIAGNOSIS — H34832 Tributary (branch) retinal vein occlusion, left eye, with macular edema: Secondary | ICD-10-CM | POA: Diagnosis not present

## 2020-05-11 DIAGNOSIS — H3562 Retinal hemorrhage, left eye: Secondary | ICD-10-CM | POA: Diagnosis not present

## 2020-05-11 DIAGNOSIS — H35033 Hypertensive retinopathy, bilateral: Secondary | ICD-10-CM | POA: Diagnosis not present

## 2020-05-16 ENCOUNTER — Other Ambulatory Visit: Payer: Self-pay | Admitting: Physician Assistant

## 2020-05-17 NOTE — Telephone Encounter (Signed)
Please review

## 2020-05-18 DIAGNOSIS — H34832 Tributary (branch) retinal vein occlusion, left eye, with macular edema: Secondary | ICD-10-CM | POA: Diagnosis not present

## 2020-06-04 ENCOUNTER — Other Ambulatory Visit: Payer: Self-pay | Admitting: Cardiology

## 2020-06-15 DIAGNOSIS — H34832 Tributary (branch) retinal vein occlusion, left eye, with macular edema: Secondary | ICD-10-CM | POA: Diagnosis not present

## 2020-07-20 DIAGNOSIS — H34832 Tributary (branch) retinal vein occlusion, left eye, with macular edema: Secondary | ICD-10-CM | POA: Diagnosis not present

## 2020-08-17 DIAGNOSIS — H34832 Tributary (branch) retinal vein occlusion, left eye, with macular edema: Secondary | ICD-10-CM | POA: Diagnosis not present

## 2020-08-17 DIAGNOSIS — H35033 Hypertensive retinopathy, bilateral: Secondary | ICD-10-CM | POA: Diagnosis not present

## 2020-08-17 DIAGNOSIS — H3562 Retinal hemorrhage, left eye: Secondary | ICD-10-CM | POA: Diagnosis not present

## 2020-08-17 DIAGNOSIS — H43823 Vitreomacular adhesion, bilateral: Secondary | ICD-10-CM | POA: Diagnosis not present

## 2020-09-02 ENCOUNTER — Ambulatory Visit (INDEPENDENT_AMBULATORY_CARE_PROVIDER_SITE_OTHER): Payer: BC Managed Care – PPO | Admitting: Physician Assistant

## 2020-09-02 ENCOUNTER — Encounter: Payer: Self-pay | Admitting: Physician Assistant

## 2020-09-02 ENCOUNTER — Ambulatory Visit: Payer: BC Managed Care – PPO | Admitting: Physician Assistant

## 2020-09-02 ENCOUNTER — Other Ambulatory Visit: Payer: Self-pay

## 2020-09-02 DIAGNOSIS — F411 Generalized anxiety disorder: Secondary | ICD-10-CM

## 2020-09-02 DIAGNOSIS — F3342 Major depressive disorder, recurrent, in full remission: Secondary | ICD-10-CM | POA: Diagnosis not present

## 2020-09-02 MED ORDER — BUPROPION HCL 75 MG PO TABS: 75.0000 mg | ORAL_TABLET | Freq: Every day | ORAL | 3 refills | Status: DC

## 2020-09-02 NOTE — Progress Notes (Signed)
[facility] Med Check  Patient ID: Jason Maldonado,  MRN: [id number]  PCP: Patient, No Pcp Per  Date of Evaluation: 09/02/2020 Time spent:20 minutes  Chief Complaint:  Chief Complaint    Depression      HISTORY/CURRENT STATUS: For routine med check.   Doing really well. Still working and wants to keep going as long as he can. He doesn't want to quit, b/c he doesn't know what he'd do if he retired.   Since his last visit he has had no health issues.  He has been able to enjoy things.  Energy and motivation are good.  Appetite has not changed.  No extreme sadness, tearfulness, or feelings of hopelessness. Personal hygiene is nl.  Denies suicidal or homicidal thoughts.  States he has no feelings of anxiety.  "I do not worry about anything that I have no control over."  Patient denies increased energy with decreased need for sleep, no increased talkativeness, no racing thoughts, no impulsivity or risky behaviors, no increased spending, no increased libido, no grandiosity, no increased irritability or anger, and no hallucinations.  Denies dizziness, syncope, seizures, numbness, tingling, tremor, tics, unsteady gait, slurred speech, confusion. Denies muscle or joint pain, stiffness, or dystonia.  Individual Medical History/ Review of Systems: Changes? :No  He continues to follow up with cardiology and other than wanting his LDL to be lower, he is doing well cardiac wise.  Past medications for mental health diagnoses include: Wellbutrin, Xanax, Cymbalta, symbyax, Seroquel, Latuda, Lamictal, Depakote, Risperdal, Prozac, Effexor, Abilify  Allergies: Patient has no known allergies.  Current Medications:  Current Outpatient Medications:  .  aspirin EC 81 MG EC tablet, Take 1 tablet (81 mg total) by mouth daily., Disp: , Rfl:  .  BRILINTA 60 MG TABS tablet, TAKE 1 TABLET BY MOUTH TWICE A DAY, Disp: 180 tablet, Rfl: 3 .  buPROPion (WELLBUTRIN) 75 MG tablet, TAKE 1 TABLET BY MOUTH EVERY  DAY, Disp: 90 tablet, Rfl: 1 .  carvedilol (COREG) 3.125 MG tablet, TAKE 1 TABLET (3.125 MG TOTAL) BY MOUTH 2 (TWO) TIMES DAILY WITH A MEAL., Disp: 180 tablet, Rfl: 3 .  ezetimibe (ZETIA) 10 MG tablet, TAKE 1 TABLET BY MOUTH EVERY DAY, Disp: 90 tablet, Rfl: 3 .  LIVALO 2 MG TABS, TAKE 1 TABLET BY MOUTH EVERY DAY, Disp: 90 tablet, Rfl: 3 .  loratadine (CLARITIN) 10 MG tablet, Take 10 mg by mouth daily., Disp: , Rfl:  .  nitroGLYCERIN (NITROSTAT) 0.4 MG SL tablet, PLACE 1 TABLET UNDER TONGUE EVERY 5 MINUTES IF NEEDED FOR CHEST PAIN. UP TO 3 DOSES, Disp: 25 tablet, Rfl: 3 Medication Side Effects: none  Family Medical/ Social History: Changes? No  MENTAL HEALTH EXAM:  There were no vitals taken for this visit.There is no height or weight on file to calculate BMI.  General Appearance: Casual, Neat and Well Groomed  Eye Contact:  Good  Speech:  Clear and Coherent and Normal Rate  Volume:  Normal  Mood:  Euthymic  Affect:  Appropriate  Thought Process:  Goal Directed  Orientation:  Full (Time, Place, and Person)  Thought Content: Logical   Suicidal Thoughts:  No  Homicidal Thoughts:  No  Memory:  WNL  Judgement:  Good  Insight:  Good  Psychomotor Activity:  Normal  Concentration:  Concentration: Good and Attention Span: Good  Recall:  Good  Fund of Knowledge: Good  Language: Good  Assets:  Desire for Improvement  ADL's:  Intact  Cognition: WNL  Prognosis:  Good  DIAGNOSES:    ICD-10-CM   1. Recurrent major depressive disorder, in full remission (HCC)  F33.42   2. Generalized anxiety disorder  F41.1     Receiving Psychotherapy: No    RECOMMENDATIONS:  PDMP reviewed.  I provided 20 mins of face to face time during this encounter in which we discussed his response to the medication.  He has done well for several years on this dose so no changes are needed. Continue Wellbutrin 75 mg, 1 p.o. daily. Return in 6 months.   Melony Overly, PA-C

## 2020-09-28 DIAGNOSIS — H43823 Vitreomacular adhesion, bilateral: Secondary | ICD-10-CM | POA: Diagnosis not present

## 2020-09-28 DIAGNOSIS — H3562 Retinal hemorrhage, left eye: Secondary | ICD-10-CM | POA: Diagnosis not present

## 2020-09-28 DIAGNOSIS — H35033 Hypertensive retinopathy, bilateral: Secondary | ICD-10-CM | POA: Diagnosis not present

## 2020-09-28 DIAGNOSIS — H34832 Tributary (branch) retinal vein occlusion, left eye, with macular edema: Secondary | ICD-10-CM | POA: Diagnosis not present

## 2020-10-22 NOTE — Progress Notes (Signed)
CARDIOLOGY OFFICE NOTE  Date:  10/25/2020    Jason Maldonado Date of Birth: October 26, 1955 Medical Record #[id number]  PCP:  Patient, No Pcp Per  Cardiologist:  [place]    Chief Complaint  Patient presents with  . Follow-up    6 months.    History of Present Illness: Jason Maldonado is a 65 y.o. male who is seen for follow up CAD.  He has a past medical history of hyperlipidemia, bipolar disorder, tobacco abuse, and a history of CAD status post previous inferior MI in 2014 with PCI to the RCA.   He presented to [facility] on 03/10/2015 as a inferior STEMI in the setting of RCA in-stent restenosis/occlusion. Cardiac catheterization on 03/10/2015 showed 100% stenosis of prox and mid RCA prox to a previously placed unknown stent, 4.038 mm Resolute DES stent postdilated to 4.41 mm proximally and 4.1 mm distally. 50% residual RPDA, 40% 1st RPLB, 40%distal RCA lesion. Post cath, he was placed on aspirin and Brilinta. Echo EF 30-35%, basal to mid inferior akinesis, inferoseptal and inferolateral severe hypokinesis. He did have refractory hypotension that gradually improved. Repeat Echo in October 2016 showed inferior HK with EF 50-55%.   On follow up today he is feeling well. He continues to smoke.  No chest pain or dyspnea. No palpitations. On his last visit noted a vision change and was diagnosed with a central retinal vein occlusion. Saw a  retinal specialist and has had steroid injections. He did not want to take PCSK 9 inhibitors in the past but is now willing to consider Nexlizet.    Past Medical History:  Diagnosis Date  . Bipolar 1 disorder (HCC)   . CAD (coronary artery disease)    a. STEMI 09/2012  single vessel occlusive CAD of the RCA s/p DES to mid RCA, EF 45%;  b. inferior STEMI 03/10/2015 DES to prox and mid RCA  . Depression   . Hyperlipidemia   . LV dysfunction    a. 09/2012 Cath LV gram severe basal to midinferior wall hypokinesis, EF 45% and echo EF 55%.  Marland Kitchen PAF  (paroxysmal atrial fibrillation) (HCC)    a. noted on admission 02/2015 in setting of inf stemi;  b. CHA2DS2VASc = 1.  . Tobacco abuse     Past Surgical History:  Procedure Laterality Date  . CARDIAC CATHETERIZATION N/A 03/10/2015   Procedure: Left Heart Cath and Coronary Angiography;  Surgeon: Lennette Bihari, MD;  Location: [facility] INVASIVE CV LAB;  Service: Cardiovascular;  Laterality: N/A;  . CARDIAC CATHETERIZATION  03/10/2015   Procedure: Coronary Stent Intervention;  Surgeon: Lennette Bihari, MD;  Location: [facility] INVASIVE CV LAB;  Service: Cardiovascular;;  . LEFT HEART CATHETERIZATION WITH CORONARY ANGIOGRAM N/A 09/27/2012   Procedure: LEFT HEART CATHETERIZATION WITH CORONARY ANGIOGRAM;  Surgeon: Braylon Grenda M [place], MD;  Location: [facility] CATH LAB;  Service: Cardiovascular;  Laterality: N/A;     Medications: Current Outpatient Medications  Medication Sig Dispense Refill  . aspirin EC 81 MG EC tablet Take 1 tablet (81 mg total) by mouth daily.    Marland Kitchen BRILINTA 60 MG TABS tablet TAKE 1 TABLET BY MOUTH TWICE A DAY 180 tablet 3  . buPROPion (WELLBUTRIN) 75 MG tablet Take 1 tablet (75 mg total) by mouth daily. 90 tablet 3  . carvedilol (COREG) 3.125 MG tablet TAKE 1 TABLET (3.125 MG TOTAL) BY MOUTH 2 (TWO) TIMES DAILY WITH A MEAL. 180 tablet 3  . ezetimibe (ZETIA) 10 MG tablet TAKE  1 TABLET BY MOUTH EVERY DAY 90 tablet 3  . LIVALO 2 MG TABS TAKE 1 TABLET BY MOUTH EVERY DAY 90 tablet 3  . loratadine (CLARITIN) 10 MG tablet Take 10 mg by mouth daily.    . nitroGLYCERIN (NITROSTAT) 0.4 MG SL tablet PLACE 1 TABLET UNDER TONGUE EVERY 5 MINUTES IF NEEDED FOR CHEST PAIN. UP TO 3 DOSES 25 tablet 3   No current facility-administered medications for this visit.    Allergies: No Known Allergies  Social History: The patient  reports that he has been smoking cigarettes. He has a 20.00 pack-year smoking history. He has never used smokeless tobacco. He reports previous alcohol use. He reports that he does not use  drugs.   Family History: The patient's family history includes CAD in his father.   Review of Systems: Please see the history of present illness.    All other systems are reviewed and negative.   Physical Exam: VS:  BP 136/88 (BP Location: Right Arm, Patient Position: Sitting, Cuff Size: Normal)   Pulse 78   Ht 5\' 7"  (1.702 m)   Wt 156 lb (70.8 kg)   BMI 24.43 kg/m  .  BMI Body mass index is 24.43 kg/m.  Wt Readings from Last 3 Encounters:  10/25/20 156 lb (70.8 kg)  04/27/20 151 lb (68.5 kg)  10/19/17 159 lb (72.1 kg)   GENERAL:  Well appearing WM in NAD HEENT:  PERRL, EOMI, sclera are clear. Oropharynx is clear. NECK:  No jugular venous distention, carotid upstroke brisk and symmetric, no bruits, no thyromegaly or adenopathy LUNGS:  Clear to auscultation bilaterally CHEST:  Unremarkable HEART:  RRR,  PMI not displaced or sustained,S1 and S2 within normal limits, no S3, no S4: no clicks, no rubs, no murmurs ABD:  Soft, nontender. BS +, no masses or bruits. No hepatomegaly, no splenomegaly EXT:  2 + pulses throughout, no edema, no cyanosis no clubbing SKIN:  Warm and dry.  No rashes NEURO:  Alert and oriented x 3. Cranial nerves II through XII intact. PSYCH:  Cognitively intact     LABORATORY DATA:  EKG:  EKG is not  ordered today    Lab Results  Component Value Date   WBC 9.2 08/03/2015   HGB 14.6 08/03/2015   HCT 43.2 08/03/2015   PLT 247 08/03/2015   GLUCOSE 92 04/06/2020   CHOL 183 04/06/2020   TRIG 122 04/06/2020   HDL 45 04/06/2020   LDLCALC 116 (H) 04/06/2020   ALT 20 04/06/2020   AST 19 04/06/2020   NA 139 04/06/2020   K 5.1 04/06/2020   CL 103 04/06/2020   CREATININE 1.13 04/06/2020   BUN 17 04/06/2020   CO2 21 04/06/2020   TSH 1.565 08/03/2015   PSA 1.30 08/03/2015   INR 1.18 09/27/2012   HGBA1C 5.7 (H) 09/27/2012    BNP (last 3 results) No results for input(s): BNP in the last 8760 hours.  ProBNP (last 3 results) No results for  input(s): PROBNP in the last 8760 hours.   Other Studies Reviewed Today: Echo: 06/08/15:Study Conclusions  - Procedure narrative: Limited transthoracic echocardiography for left ventricular function evaluation. Image quality was adequate. - Left ventricle: The cavity size was normal. Wall thickness was normal. Systolic function was normal. The estimated ejection fraction was in the range of 50% to 55%. There is inferior and inferoseptal hypokinesis.  Impressions:  - Limited echo for LV function. There is inferior and inferoseptal hypokinesis. LVEF has improved to 50-55%.  Assessment/Plan:  1. CAD s/p inferior MI with stenting in 2014. Recurrent STEMI in July 2016 with instent restenosis/occlusion. S/p repeat DES. On DAPT. Would favor continuing this long term. Continue Brilinta 60 mg bid and ASA 81 mg daily. Continue low dose beta blocker. Unable to tolerate higher dose due to low BP.  Encourage exercise and complete smoking cessation.  2. Tobacco abuse. Encourage efforts at complete smoking cessation.   3. Ischemic cardiomyopathy in setting of acute MI. EF has recovered.   4. Hyperlipidemia. Intolerant of lipitor. Now on Livalo 2 mg and Zetia 10 mg daily.will switch Zetia to Nexlizet 180/10 mg daily. Repeat lab work in 3 months.   5.. Bipolar disorder.   Current medicines are reviewed with the patient today.  The patient does not have concerns regarding medicines other than what has been noted above.  The following changes have been made:  See above.  Labs/ tests ordered today include:    No orders of the defined types were placed in this encounter.    Disposition:   FU  in 6 months.  Signed: Marja Adderley [place] MD, [facility]    10/25/2020 4:26 PM

## 2020-10-25 ENCOUNTER — Other Ambulatory Visit: Payer: Self-pay

## 2020-10-25 ENCOUNTER — Ambulatory Visit: Payer: BC Managed Care – PPO | Admitting: Cardiology

## 2020-10-25 ENCOUNTER — Other Ambulatory Visit: Payer: Self-pay | Admitting: Cardiology

## 2020-10-25 ENCOUNTER — Encounter: Payer: Self-pay | Admitting: Cardiology

## 2020-10-25 VITALS — BP 136/88 | HR 78 | Ht 67.0 in | Wt 156.0 lb

## 2020-10-25 DIAGNOSIS — Z720 Tobacco use: Secondary | ICD-10-CM | POA: Diagnosis not present

## 2020-10-25 DIAGNOSIS — I2510 Atherosclerotic heart disease of native coronary artery without angina pectoris: Secondary | ICD-10-CM | POA: Diagnosis not present

## 2020-10-25 DIAGNOSIS — E7800 Pure hypercholesterolemia, unspecified: Secondary | ICD-10-CM

## 2020-10-25 MED ORDER — NITROGLYCERIN 0.4 MG SL SUBL: SUBLINGUAL_TABLET | SUBLINGUAL | 11 refills | Status: DC

## 2020-10-25 MED ORDER — NEXLIZET 180-10 MG PO TABS: ORAL_TABLET | ORAL | 3 refills | Status: DC

## 2020-10-25 MED ORDER — SILDENAFIL CITRATE 50 MG PO TABS: 50.0000 mg | ORAL_TABLET | Freq: Every day | ORAL | 3 refills | Status: DC | PRN

## 2020-10-25 NOTE — Patient Instructions (Signed)
Medication Instructions:  Stop Zetia  Start Nexlizet 180/10 mg daily Continue all other medications *If you need a refill on your cardiac medications before your next appointment, please call your pharmacy*   Lab Work: 3 month lipid and hepatic panels    Testing/Procedures: None ordered   Follow-Up: At [facility], you and your health needs are our priority.  As part of our continuing mission to provide you with exceptional heart care, we have created designated Provider Care Teams.  These Care Teams include your primary Cardiologist (physician) and Advanced Practice Providers (APPs -  Physician Assistants and Nurse Practitioners) who all work together to provide you with the care you need, when you need it.  We recommend signing up for the patient portal called "MyChart".  Sign up information is provided on this After Visit Summary.  MyChart is used to connect with patients for Virtual Visits (Telemedicine).  Patients are able to view lab/test results, encounter notes, upcoming appointments, etc.  Non-urgent messages can be sent to your provider as well.   To learn more about what you can do with MyChart, go to [email or web address].    Your next appointment:  6 months    Call in July to schedule Sept appointment  The format for your next appointment:  Office     Provider:  Dr.Jordan

## 2020-10-25 NOTE — Addendum Note (Signed)
Addended by: Neoma Laming on: 10/25/2020 04:35 PM   Modules accepted: Orders

## 2020-10-27 ENCOUNTER — Telehealth: Payer: Self-pay

## 2020-10-27 NOTE — Telephone Encounter (Signed)
**Note De-Identified  Obfuscation** Nexlizet PA started through covermymeds. Key: Jason Maldonado

## 2020-11-01 NOTE — Telephone Encounter (Signed)
**Note De-Identified  Obfuscation** Message from Covermymeds: Jason Maldonado SR. Key: [id number] Outcome: Approved on March 16 Effective from 10/27/2020 through 10/26/2023. Drug: Nexlizet 180-10MG  tablets Form: Consulting civil engineer Form (CB)  [facility] is aware of approval.

## 2020-11-09 DIAGNOSIS — H43823 Vitreomacular adhesion, bilateral: Secondary | ICD-10-CM | POA: Diagnosis not present

## 2020-11-09 DIAGNOSIS — H34832 Tributary (branch) retinal vein occlusion, left eye, with macular edema: Secondary | ICD-10-CM | POA: Diagnosis not present

## 2020-11-09 DIAGNOSIS — H3562 Retinal hemorrhage, left eye: Secondary | ICD-10-CM | POA: Diagnosis not present

## 2020-11-09 DIAGNOSIS — H35033 Hypertensive retinopathy, bilateral: Secondary | ICD-10-CM | POA: Diagnosis not present

## 2020-12-30 ENCOUNTER — Telehealth: Payer: Self-pay | Admitting: Physician Assistant

## 2020-12-30 NOTE — Telephone Encounter (Signed)
Spoke to pt and he wants a call from you specifically.I let him know your are gone for the day and if he leaves a message with me I can forward it to you.He says he has been on Wellbutrin for mild depression and it helps but he believes he should try pristiq as well for other issues.

## 2020-12-30 NOTE — Telephone Encounter (Signed)
Pt would like to change up his medication. Please call.

## 2020-12-31 NOTE — Telephone Encounter (Signed)
He's not on a therapeutic dose of Wellbutrin so recommend increase dose before adding or changing a drug.  Have him increase the Wellbutrin 75 mg from 1 p.o. daily to 1 p.o. twice daily.  If he needs a prescription sent in let me know.  Thank you

## 2020-12-31 NOTE — Telephone Encounter (Signed)
Please add to cancellation list

## 2021-02-23 ENCOUNTER — Telehealth: Payer: Self-pay | Admitting: Cardiology

## 2021-02-23 DIAGNOSIS — K4090 Unilateral inguinal hernia, without obstruction or gangrene, not specified as recurrent: Secondary | ICD-10-CM

## 2021-02-23 NOTE — Telephone Encounter (Signed)
Patient is requesting a referral for a new PCP.  He states he has an inguinal hernia and he has been in a lot of pain so he is hoping to be referred ASAP. He states he will take any recommendation Dr. [place] may be able to offer.

## 2021-02-23 NOTE — Telephone Encounter (Signed)
Spoke to patient-patient requesting referral to PCP, he believes he has a hernia.  He would like Dr. Elvis Coil recommendations.

## 2021-02-24 NOTE — Telephone Encounter (Signed)
Spoke to patient Dr.Jordan's advice given.Referral for general surgery placed.Spoke to [facility] they will call patient to schedule appointment.

## 2021-02-24 NOTE — Telephone Encounter (Signed)
If he has a hernia he should be evaluated by general surgery not PCP. OK to make surgery referral.  Tobey Schmelzle [place] MD, [facility]

## 2021-03-02 ENCOUNTER — Other Ambulatory Visit: Payer: Self-pay | Admitting: Cardiology

## 2021-03-03 ENCOUNTER — Ambulatory Visit: Payer: BC Managed Care – PPO | Admitting: Physician Assistant

## 2021-03-08 DIAGNOSIS — H35033 Hypertensive retinopathy, bilateral: Secondary | ICD-10-CM | POA: Diagnosis not present

## 2021-03-08 DIAGNOSIS — H34832 Tributary (branch) retinal vein occlusion, left eye, with macular edema: Secondary | ICD-10-CM | POA: Diagnosis not present

## 2021-03-08 DIAGNOSIS — H3562 Retinal hemorrhage, left eye: Secondary | ICD-10-CM | POA: Diagnosis not present

## 2021-03-08 DIAGNOSIS — H43823 Vitreomacular adhesion, bilateral: Secondary | ICD-10-CM | POA: Diagnosis not present

## 2021-03-11 DIAGNOSIS — E7800 Pure hypercholesterolemia, unspecified: Secondary | ICD-10-CM | POA: Diagnosis not present

## 2021-03-11 DIAGNOSIS — I2510 Atherosclerotic heart disease of native coronary artery without angina pectoris: Secondary | ICD-10-CM | POA: Diagnosis not present

## 2021-03-11 DIAGNOSIS — Z720 Tobacco use: Secondary | ICD-10-CM | POA: Diagnosis not present

## 2021-03-11 LAB — HEPATIC FUNCTION PANEL
ALT: 14 IU/L (ref 0–44)
AST: 21 IU/L (ref 0–40)
Albumin: 4.6 g/dL (ref 3.8–4.8)
Alkaline Phosphatase: 40 IU/L — ABNORMAL LOW (ref 44–121)
Bilirubin Total: 0.5 mg/dL (ref 0.0–1.2)
Bilirubin, Direct: 0.16 mg/dL (ref 0.00–0.40)
Total Protein: 6.9 g/dL (ref 6.0–8.5)

## 2021-03-11 LAB — LIPID PANEL
Chol/HDL Ratio: 2.9 ratio (ref 0.0–5.0)
Cholesterol, Total: 145 mg/dL (ref 100–199)
HDL: 50 mg/dL (ref >39)
LDL Chol Calc (NIH): 81 mg/dL (ref 0–99)
Triglycerides: 71 mg/dL (ref 0–149)
VLDL Cholesterol Cal: 14 mg/dL (ref 5–40)

## 2021-03-16 ENCOUNTER — Other Ambulatory Visit: Payer: Self-pay

## 2021-03-16 DIAGNOSIS — E7800 Pure hypercholesterolemia, unspecified: Secondary | ICD-10-CM

## 2021-03-16 DIAGNOSIS — I2510 Atherosclerotic heart disease of native coronary artery without angina pectoris: Secondary | ICD-10-CM

## 2021-03-16 NOTE — Progress Notes (Signed)
Uric

## 2021-03-22 DIAGNOSIS — I2510 Atherosclerotic heart disease of native coronary artery without angina pectoris: Secondary | ICD-10-CM | POA: Diagnosis not present

## 2021-03-22 DIAGNOSIS — E7800 Pure hypercholesterolemia, unspecified: Secondary | ICD-10-CM | POA: Diagnosis not present

## 2021-03-23 LAB — URIC ACID: Uric Acid: 7.6 mg/dL (ref 3.8–8.4)

## 2021-03-25 ENCOUNTER — Ambulatory Visit: Payer: Self-pay | Admitting: Surgery

## 2021-03-25 DIAGNOSIS — K4090 Unilateral inguinal hernia, without obstruction or gangrene, not specified as recurrent: Secondary | ICD-10-CM | POA: Diagnosis not present

## 2021-03-25 DIAGNOSIS — K439 Ventral hernia without obstruction or gangrene: Secondary | ICD-10-CM | POA: Diagnosis not present

## 2021-03-25 NOTE — H&P (Signed)
Jason Maldonado [id number]    Referring Provider:  Jordan, Peter Manning, *     Subjective    Chief Complaint: Hernia       History of Present Illness:    65-year-old gentleman presents for evaluation of possible inguinal hernia.  He has a history of coronary artery disease with previous MI and RCA stent, bipolar disorder, tobacco abuse.  He is on aspirin and Brilinta.  Last echo was October 2016 with EF 50 to 55%. In about April of this year, he had a tearing sensation and severe pain in the left groin.  Subsequent to that he started to notice a bulge in that area which he is able to reduce with upward inward pressure.  Denies any obstructive symptoms or urinary symptoms.  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  He does smoke but has cut down on this a fair amount. Previous abdominal surgery includes appendectomy when he was 8 or 65 years old.     Review of Systems: A complete review of systems was obtained from the patient.  I have reviewed this information and discussed as appropriate with the patient.  See HPI as well for other ROS.     Medical History:     Past Medical History:  Diagnosis Date   CHF (congestive heart failure) (CMS-HCC)     Hyperlipidemia        There is no problem list on file for this patient.          Past Surgical History:  Procedure Laterality Date   APPENDECTOMY          No Known Allergies         Current Outpatient Medications on File Prior to Visit  Medication Sig Dispense Refill   BRILINTA 60 mg tablet Take 60 mg by mouth 2 (two) times daily       carvediloL (COREG) 3.125 MG tablet TAKE 1 TABLET (3.125 MG TOTAL) BY MOUTH 2 (TWO) TIMES DAILY WITH A MEAL.       LIVALO 2 mg Tab Take 1 tablet by mouth once daily       NEXLIZET 180-10 mg Tab Take 1 tablet by mouth once daily       nitroGLYcerin (NITROSTAT) 0.4 MG SL tablet as directed       sildenafiL (VIAGRA) 50 MG tablet           No current facility-administered medications on file prior to visit.           Family History  Problem Relation Age of Onset   Diabetes Mother     Hyperlipidemia (Elevated cholesterol) Father     Heart valve disease Father        Social History        Tobacco Use  Smoking Status Current Every Day Smoker   Packs/day: 0.50   Types: Cigarettes  Smokeless Tobacco Never Used      Social History         Socioeconomic History   Marital status: Married  Tobacco Use   Smoking status: Current Every Day Smoker      Packs/day: 0.50      Types: Cigarettes   Smokeless tobacco: Never Used  Substance and Sexual Activity   Alcohol use: Yes   Drug use: Never      Objective:         Vitals:    03/25/21 1542  Pulse:   83  Temp: 37.3 C (99.1 F)  SpO2: 98%  Weight: 70 kg (154 lb 6.4 oz)  Height: 170.2 cm (5' 7")    Body mass index is 24.18 kg/m.   Alert, well-appearing Unlabored respirations Abdomen is soft and nontender.  There is a partially reducible supraumbilical hernia which is minimally tender.  There is a reducible moderate sized left inguinal hernia.  No hernia on the right.   Assessment and Plan:  Diagnoses and all orders for this visit:   Inguinal hernia without obstruction or gangrene, recurrence not specified, unspecified laterality   Epigastric hernia     I recommend an open repair for both of these.  We will request cardiac clearance and clearance to hold the Brilinta. We discussed the relevant anatomy and we discussed the technique of the procedure.  Discussed risks of bleeding, infection, pain, scarring, injury to structures in the area including nerves, blood vessels, bowel, bladder, risk of chronic pain, hernia recurrence, risk of seroma or hematoma, urinary retention, and risks of general anesthesia including cardiovascular, pulmonary, and thromboembolic complications.  Discussed activity restrictions in the postop period.  Questions were  answered.  Patient wishes to proceed with scheduling.   

## 2021-03-28 ENCOUNTER — Telehealth: Payer: Self-pay

## 2021-03-28 NOTE — Telephone Encounter (Signed)
   [facility] Pre-operative Risk Assessment    Patient Name: Jason Maldonado  DOB: 1955/09/14 MRN: [id number]   Request for surgical clearance:  What type of surgery is being performed Left inguinal and epigastric hernia repair  When is this surgery scheduled TBD  What type of clearance is required  Both  Are there any medications that need to be held prior to surgery and how long  Brilinta  Practice name and name of physician performing surgery  [facility]    Dr.Chelsea Fredricka Bonine  What is the office phone number [phone]   7.   What is the office fax number        [phone]  8.   Anesthesia type Not listed   Neoma Laming 03/28/2021, 3:26 PM  _________________________________________________________________   (provider comments below)

## 2021-03-29 NOTE — Telephone Encounter (Signed)
   Name: Jason Maldonado  DOB: [birth year]  MRN: [id number]   Primary Cardiologist: Peter [place], MD  Chart reviewed as part of pre-operative protocol coverage. Patient was contacted 03/29/2021 in reference to pre-operative risk assessment for pending surgery as outlined below.  Jason Maldonado was last seen on 10/25/20 by Dr. [place].  Since that day, Jason Maldonado has done fine from a cardiac standpoint. He an easily complete 4 METs without anginal complaints.  Therefore, based on ACC/AHA guidelines, the patient would be at acceptable risk for the planned procedure without further cardiovascular testing.   The patient was advised that if he develops new symptoms prior to surgery to contact our office to arrange for a follow-up visit, and he verbalized understanding.  Per Dr. [place], patient can hold brilinta 5 days prior to his upcoming procedure with plans to restart when cleared to do so by his surgeon.   I will route this recommendation to the requesting party via Epic fax function and remove from pre-op pool. Please call with questions.  Beatriz Stallion, PA-C 03/29/2021, 4:09 PM

## 2021-03-29 NOTE — Telephone Encounter (Signed)
He may hold Brilinta for surgery.  Tarnisha Kachmar [place] MD, [facility]

## 2021-04-25 ENCOUNTER — Ambulatory Visit: Payer: BC Managed Care – PPO | Admitting: Physician Assistant

## 2021-05-18 NOTE — Patient Instructions (Addendum)
DUE TO COVID-19 ONLY ONE VISITOR IS ALLOWED TO COME WITH YOU AND STAY IN THE WAITING ROOM ONLY DURING PRE OP AND PROCEDURE.   **NO VISITORS ARE ALLOWED IN THE SHORT STAY AREA OR RECOVERY ROOM!!**  IF YOU WILL BE ADMITTED INTO THE HOSPITAL YOU ARE ALLOWED ONLY TWO SUPPORT PEOPLE DURING VISITATION HOURS ONLY (10AM -8PM)   The support person(s) may change daily. The support person(s) must pass our screening, gel in and out, and wear a mask at all times, including in the patient's room. Patients must also wear a mask when staff or their support person are in the room.  No visitors under the age of 17. Any visitor under the age of 42 must be accompanied by an adult.    Your procedure is scheduled on: 05/25/21   Report to [facility] Main Entrance    Report to admitting at 12:00 PM   Call this number if you have problems the morning of surgery [phone]   Do not eat food :After Midnight.   May have liquids until 11:15 AM day of surgery  CLEAR LIQUID DIET  Foods Allowed                                                                     Foods Excluded  Water, Black Coffee and tea (no milk or creamer)           liquids that you cannot  Plain Jell-O in any flavor  (No red)                                    see through such as: Fruit ices (not with fruit pulp)                                            milk, soups, orange juice              Iced Popsicles (No red)                                                All solid food                                   Apple juices Sports drinks like Gatorade (No red) Lightly seasoned clear broth or consume(fat free) Sugar   Oral Hygiene is also important to reduce your risk of infection.                                    Remember - BRUSH YOUR TEETH THE MORNING OF SURGERY WITH YOUR REGULAR TOOTHPASTE   Do NOT smoke after Midnight   Take these medicines the morning of surgery with A SIP OF WATER: Wellbutrin, Coreg  You may not have any metal on your body including jewelry, and body piercing             Do not wear lotions, powders, cologne, or deodorant              Men may shave face and neck.   Do not bring valuables to the hospital. [facility] IS NOT             RESPONSIBLE   FOR VALUABLES.    Patients discharged on the day of surgery will not be allowed to drive home.   Please read over the following fact sheets you were given: IF YOU HAVE QUESTIONS ABOUT YOUR PRE-OP INSTRUCTIONS PLEASE CALL [phone]- [facility] - Preparing for Surgery Before surgery, you can play an important role.  Because skin is not sterile, your skin needs to be as free of germs as possible.  You can reduce the number of germs on your skin by washing with CHG (chlorahexidine gluconate) soap before surgery.  CHG is an antiseptic cleaner which kills germs and bonds with the skin to continue killing germs even after washing. Please DO NOT use if you have an allergy to CHG or antibacterial soaps.  If your skin becomes reddened/irritated stop using the CHG and inform your nurse when you arrive at Short Stay. Do not shave (including legs and underarms) for at least 48 hours prior to the first CHG shower.  You may shave your face/neck.  Please follow these instructions carefully:  1.  Shower with CHG Soap the night before surgery and the  morning of surgery.  2.  If you choose to wash your hair, wash your hair first as usual with your normal  shampoo.  3.  After you shampoo, rinse your hair and body thoroughly to remove the shampoo.                             4.  Use CHG as you would any other liquid soap.  You can apply chg directly to the skin and wash.  Gently with a scrungie or clean washcloth.  5.  Apply the CHG Soap to your body ONLY FROM THE NECK DOWN.   Do   not use on face/ open                           Wound or open sores. Avoid contact with eyes, ears mouth and   genitals (private parts).                        Wash face,  Genitals (private parts) with your normal soap.             6.  Wash thoroughly, paying special attention to the area where your    surgery  will be performed.  7.  Thoroughly rinse your body with warm water from the neck down.  8.  DO NOT shower/wash with your normal soap after using and rinsing off the CHG Soap.                9.  Pat yourself dry with a clean towel.            10.  Wear clean pajamas.            11.  Place clean sheets on your bed the night of  your first shower and do not  sleep with pets. Day of Surgery : Do not apply any lotions/deodorants the morning of surgery.  Please wear clean clothes to the hospital/surgery center.  FAILURE TO FOLLOW THESE INSTRUCTIONS MAY RESULT IN THE CANCELLATION OF YOUR SURGERY  PATIENT SIGNATURE_________________________________  NURSE SIGNATURE__________________________________  ________________________________________________________________________

## 2021-05-18 NOTE — Progress Notes (Addendum)
COVID swab appointment: n/a  COVID Vaccine Completed: no Date COVID Vaccine completed: Has received booster: COVID vaccine manufacturer: Pfizer    [employer]   Date of COVID positive in last 90 days: no  PCP - no Cardiologist - Peter [place], MD  Cardiac clearance by Anabel Halon 03/28/21 in Epic  Chest x-ray - n/a EKG - 05/19/21 Epic Stress Test - n/a ECHO - 2016 Cardiac Cath - 2016 Pacemaker/ICD device last checked:n/a Spinal Cord Stimulator: n/a  Sleep Study - n/a CPAP -   Fasting Blood Sugar - n/a Checks Blood Sugar _____ times a day  Blood Thinner Instructions: Brilinta, hold 5 days Aspirin Instructions: ASA 81 Last Dose: 01/21/21  Activity level: Can go up a flight of stairs and perform activities of daily living without stopping and without symptoms of chest pain or shortness of breath.      Anesthesia review: CHF, PAF, CAD, STEMI  Patient denies shortness of breath, fever, cough and chest pain at PAT appointment   Patient verbalized understanding of instructions that were given to them at the PAT appointment. Patient was also instructed that they will need to review over the PAT instructions again at home before surgery.

## 2021-05-19 ENCOUNTER — Encounter (HOSPITAL_COMMUNITY)
Admission: RE | Admit: 2021-05-19 | Discharge: 2021-05-19 | Disposition: A | Discharge status: Home / Self Care | Payer: BC Managed Care – PPO | Source: Ambulatory Visit | Attending: Surgery | Admitting: Surgery

## 2021-05-19 ENCOUNTER — Other Ambulatory Visit: Payer: Self-pay

## 2021-05-19 ENCOUNTER — Encounter (HOSPITAL_COMMUNITY): Payer: Self-pay

## 2021-05-19 DIAGNOSIS — Z01818 Encounter for other preprocedural examination: Secondary | ICD-10-CM | POA: Insufficient documentation

## 2021-05-19 DIAGNOSIS — I2510 Atherosclerotic heart disease of native coronary artery without angina pectoris: Secondary | ICD-10-CM | POA: Diagnosis not present

## 2021-05-19 DIAGNOSIS — Z79899 Other long term (current) drug therapy: Secondary | ICD-10-CM | POA: Insufficient documentation

## 2021-05-19 DIAGNOSIS — F17200 Nicotine dependence, unspecified, uncomplicated: Secondary | ICD-10-CM | POA: Insufficient documentation

## 2021-05-19 DIAGNOSIS — I480 Paroxysmal atrial fibrillation: Secondary | ICD-10-CM | POA: Diagnosis not present

## 2021-05-19 DIAGNOSIS — Z7982 Long term (current) use of aspirin: Secondary | ICD-10-CM | POA: Diagnosis not present

## 2021-05-19 DIAGNOSIS — K4090 Unilateral inguinal hernia, without obstruction or gangrene, not specified as recurrent: Secondary | ICD-10-CM | POA: Diagnosis not present

## 2021-05-19 LAB — CBC
HCT: 44.2 % (ref 39.0–52.0)
Hemoglobin: 14.1 g/dL (ref 13.0–17.0)
MCH: 27.1 pg (ref 26.0–34.0)
MCHC: 31.9 g/dL (ref 30.0–36.0)
MCV: 84.8 fL (ref 80.0–100.0)
Platelets: 270 10*3/uL (ref 150–400)
RBC: 5.21 MIL/uL (ref 4.22–5.81)
RDW: 14 % (ref 11.5–15.5)
WBC: 7.3 10*3/uL (ref 4.0–10.5)
nRBC: 0 % (ref 0.0–0.2)

## 2021-05-19 LAB — BASIC METABOLIC PANEL
Anion gap: 9 (ref 5–15)
BUN: 16 mg/dL (ref 8–23)
CO2: 25 mmol/L (ref 22–32)
Calcium: 9.2 mg/dL (ref 8.9–10.3)
Chloride: 104 mmol/L (ref 98–111)
Creatinine, Ser: 1.23 mg/dL (ref 0.61–1.24)
GFR, Estimated: >60 mL/min (ref >60)
Glucose, Bld: 74 mg/dL (ref 70–99)
Potassium: 4.1 mmol/L (ref 3.5–5.1)
Sodium: 138 mmol/L (ref 135–145)

## 2021-05-20 NOTE — Anesthesia Preprocedure Evaluation (Addendum)
Anesthesia Evaluation  Patient identified by MRN, date of birth, ID band Patient awake    Reviewed: Allergy & Precautions, NPO status , Patient's Chart, lab work & pertinent test results  Airway Mallampati: II  TM Distance: >3 FB Neck ROM: Full    Dental no notable dental hx.    Pulmonary Current Smoker,    Pulmonary exam normal breath sounds clear to auscultation       Cardiovascular + CAD, + Past MI, + Cardiac Stents and +CHF  Normal cardiovascular exam Rhythm:Regular Rate:Normal     Neuro/Psych negative neurological ROS  negative psych ROS   GI/Hepatic negative GI ROS, Neg liver ROS,   Endo/Other  negative endocrine ROS  Renal/GU negative Renal ROS  negative genitourinary   Musculoskeletal negative musculoskeletal ROS (+)   Abdominal   Peds negative pediatric ROS (+)  Hematology negative hematology ROS (+)   Anesthesia Other Findings   Reproductive/Obstetrics negative OB ROS                            Anesthesia Physical Anesthesia Plan  ASA: 3  Anesthesia Plan: General   Post-op Pain Management:    Induction: Intravenous  PONV Risk Score and Plan: 1 and Ondansetron and Dexamethasone  Airway Management Planned: Oral ETT  Additional Equipment:   Intra-op Plan:   Post-operative Plan: Extubation in OR  Informed Consent: I have reviewed the patients History and Physical, chart, labs and discussed the procedure including the risks, benefits and alternatives for the proposed anesthesia with the patient or authorized representative who has indicated his/her understanding and acceptance.     Dental advisory given  Plan Discussed with: CRNA  Anesthesia Plan Comments: (See PAT note 05/19/2021, Jodell Cipro Ward, PA-)      Anesthesia Quick Evaluation

## 2021-05-20 NOTE — Progress Notes (Signed)
Anesthesia Chart Review   Case: [id number] Date/Time: 05/25/21 1400   Procedure: OPEN LEFT INGUINAL HERNIA AND EPIGASTRIC HERNIA REPAIR (Left)   Anesthesia type: General   Pre-op diagnosis: HERNIA   Location: [facility] 02 / [facility]   Surgeons: Berna Bue, MD       DISCUSSION:65 y.o. every day smoker with h/o CAD (PCI to RCA 2014, DES 2016), PAF, hernia scheduled for above procedure 05/25/2021 with Dr. Phylliss Blakes.   Per cardiology preoperative evaluation 03/29/2021, "Chart reviewed as part of pre-operative protocol coverage. Patient was contacted 03/29/2021 in reference to pre-operative risk assessment for pending surgery as outlined below.  Jason Maldonado was last seen on 10/25/20 by Dr. Swaziland.  Since that day, Jason Maldonado has done fine from a cardiac standpoint. He an easily complete 4 METs without anginal complaints.   Therefore, based on ACC/AHA guidelines, the patient would be at acceptable risk for the planned procedure without further cardiovascular testing.    The patient was advised that if he develops new symptoms prior to surgery to contact our office to arrange for a follow-up visit, and he verbalized understanding.   Per Dr. Swaziland, patient can hold brilinta 5 days prior to his upcoming procedure with plans to restart when cleared to do so by his surgeon."  Anticipate pt can proceed with planned procedure barring acute status change.   VS: BP (P) 132/84   Pulse 88   Temp 37.2 C (Oral)   Resp 16   Ht 5\' 7"  (1.702 m)   Wt 68.1 kg   SpO2 97%   BMI 23.52 kg/m   PROVIDERS: Patient, No Pcp Per (Inactive)  , Peter, MD is Cardiologist  LABS: Labs reviewed: Acceptable for surgery. (all labs ordered are listed, but only abnormal results are displayed)  Labs Reviewed  BASIC METABOLIC PANEL  CBC     IMAGES:   EKG: 05/19/2021 Rate 74 bpm  Sinus rhythm with marked sinus arrhythmia with occasional Premature ventricular complexes T wave abnormality,  consider lateral ischemia Abnormal ECG Since previous tracing T wave inversion  CV: Echo 06/08/2015 - Procedure narrative: Limited transthoracic echocardiography for    left ventricular function evaluation. Image quality was adequate.  - Left ventricle: The cavity size was normal. Wall thickness was    normal. Systolic function was normal. The estimated ejection    fraction was in the range of 50% to 55%. There is inferior and    inferoseptal hypokinesis.   Impressions:   - Limited echo for LV function. There is inferior and inferoseptal    hypokinesis. LVEF has improved to 50-55%.  Past Medical History:  Diagnosis Date   Bipolar 1 disorder (HCC)    CAD (coronary artery disease)    a. STEMI 09/2012  single vessel occlusive CAD of the RCA s/p DES to mid RCA, EF 45%;  b. inferior STEMI 03/10/2015 DES to prox and mid RCA   Depression    Hyperlipidemia    LV dysfunction    a. 09/2012 Cath LV gram severe basal to midinferior wall hypokinesis, EF 45% and echo EF 55%.   PAF (paroxysmal atrial fibrillation) (HCC)    a. noted on admission 02/2015 in setting of inf stemi;  b. CHA2DS2VASc = 1.   Tobacco abuse     Past Surgical History:  Procedure Laterality Date   APPENDECTOMY     CARDIAC CATHETERIZATION N/A 03/10/2015   Procedure: Left Heart Cath and Coronary Angiography;  Surgeon: 03/12/2015, MD;  Location: [facility];  Service: Cardiovascular;  Laterality: N/A;   CARDIAC CATHETERIZATION  03/10/2015   Procedure: Coronary Stent Intervention;  Surgeon: Lennette Bihari, MD;  Location: [facility];  Service: Cardiovascular;;   LEFT HEART CATHETERIZATION WITH CORONARY ANGIOGRAM N/A 09/27/2012   Procedure: LEFT HEART CATHETERIZATION WITH CORONARY ANGIOGRAM;  Surgeon: [name], MD;  Location: [facility];  Service: Cardiovascular;  Laterality: N/A;   TONSILLECTOMY      MEDICATIONS:  aspirin EC 81 MG EC tablet   Bempedoic Acid-Ezetimibe (NEXLIZET) 180-10 MG TABS    BRILINTA 60 MG TABS tablet   buPROPion (WELLBUTRIN) 75 MG tablet   carvedilol (COREG) 3.125 MG tablet   ibuprofen (ADVIL) 200 MG tablet   LIVALO 2 MG TABS   loratadine (CLARITIN) 10 MG tablet   nitroGLYCERIN (NITROSTAT) 0.4 MG SL tablet   sildenafil (VIAGRA) 50 MG tablet   No current facility-administered medications for this encounter.    Jodell Cipro Ward, PA-C [facility] [phone]

## 2021-05-25 ENCOUNTER — Ambulatory Visit (HOSPITAL_COMMUNITY): Payer: BC Managed Care – PPO | Admitting: Physician Assistant

## 2021-05-25 ENCOUNTER — Ambulatory Visit (HOSPITAL_COMMUNITY): Payer: BC Managed Care – PPO | Admitting: Registered Nurse

## 2021-05-25 ENCOUNTER — Encounter (HOSPITAL_COMMUNITY)
Admission: RE | Disposition: A | Discharge status: Home / Self Care | Payer: Self-pay | Source: Home / Self Care | Attending: Surgery

## 2021-05-25 ENCOUNTER — Encounter (HOSPITAL_COMMUNITY): Payer: Self-pay | Admitting: Surgery

## 2021-05-25 ENCOUNTER — Ambulatory Visit (HOSPITAL_COMMUNITY)
Admission: RE | Admit: 2021-05-25 | Discharge: 2021-05-25 | Disposition: A | Discharge status: Home / Self Care | Payer: BC Managed Care – PPO | Attending: Surgery | Admitting: Surgery

## 2021-05-25 DIAGNOSIS — I252 Old myocardial infarction: Secondary | ICD-10-CM | POA: Diagnosis not present

## 2021-05-25 DIAGNOSIS — Z8249 Family history of ischemic heart disease and other diseases of the circulatory system: Secondary | ICD-10-CM | POA: Diagnosis not present

## 2021-05-25 DIAGNOSIS — K4090 Unilateral inguinal hernia, without obstruction or gangrene, not specified as recurrent: Secondary | ICD-10-CM | POA: Insufficient documentation

## 2021-05-25 DIAGNOSIS — Z955 Presence of coronary angioplasty implant and graft: Secondary | ICD-10-CM | POA: Diagnosis not present

## 2021-05-25 DIAGNOSIS — I5022 Chronic systolic (congestive) heart failure: Secondary | ICD-10-CM | POA: Diagnosis not present

## 2021-05-25 DIAGNOSIS — I509 Heart failure, unspecified: Secondary | ICD-10-CM | POA: Insufficient documentation

## 2021-05-25 DIAGNOSIS — I2510 Atherosclerotic heart disease of native coronary artery without angina pectoris: Secondary | ICD-10-CM | POA: Diagnosis not present

## 2021-05-25 DIAGNOSIS — Z7982 Long term (current) use of aspirin: Secondary | ICD-10-CM | POA: Diagnosis not present

## 2021-05-25 DIAGNOSIS — K436 Other and unspecified ventral hernia with obstruction, without gangrene: Secondary | ICD-10-CM | POA: Diagnosis not present

## 2021-05-25 DIAGNOSIS — F17210 Nicotine dependence, cigarettes, uncomplicated: Secondary | ICD-10-CM | POA: Insufficient documentation

## 2021-05-25 DIAGNOSIS — Z9049 Acquired absence of other specified parts of digestive tract: Secondary | ICD-10-CM | POA: Insufficient documentation

## 2021-05-25 DIAGNOSIS — I480 Paroxysmal atrial fibrillation: Secondary | ICD-10-CM | POA: Diagnosis not present

## 2021-05-25 DIAGNOSIS — K439 Ventral hernia without obstruction or gangrene: Secondary | ICD-10-CM | POA: Diagnosis not present

## 2021-05-25 HISTORY — PX: INGUINAL HERNIA REPAIR: SHX194

## 2021-05-25 SURGERY — REPAIR, HERNIA, INGUINAL, ADULT
Anesthesia: General | Site: Abdomen | Laterality: Left

## 2021-05-25 MED ORDER — KETOROLAC TROMETHAMINE 30 MG/ML IJ SOLN
INTRAMUSCULAR | Status: AC
  Administered 2021-05-25: 30 mg via INTRAVENOUS
  Filled 2021-05-25: qty 1

## 2021-05-25 MED ORDER — ACETAMINOPHEN 500 MG PO TABS
1000.0000 mg | ORAL_TABLET | ORAL | Status: AC
  Administered 2021-05-25: 1000 mg via ORAL
  Filled 2021-05-25: qty 2

## 2021-05-25 MED ORDER — OXYCODONE HCL 5 MG PO TABS: 5.0000 mg | ORAL_TABLET | Freq: Three times a day (TID) | ORAL | 0 refills | Status: AC | PRN

## 2021-05-25 MED ORDER — OXYCODONE HCL 5 MG/5ML PO SOLN
5.0000 mg | Freq: Once | ORAL | Status: AC | PRN
Start: 2021-05-25 — End: 2021-05-25

## 2021-05-25 MED ORDER — PHENYLEPHRINE HCL-NACL 20-0.9 MG/250ML-% IV SOLN: INTRAVENOUS | Status: DC | PRN

## 2021-05-25 MED ORDER — MIDAZOLAM HCL 5 MG/5ML IJ SOLN
INTRAMUSCULAR | Status: DC | PRN
  Administered 2021-05-25: 2 mg via INTRAVENOUS

## 2021-05-25 MED ORDER — BUPIVACAINE LIPOSOME 1.3 % IJ SUSP
INTRAMUSCULAR | Status: AC
  Filled 2021-05-25: qty 20

## 2021-05-25 MED ORDER — EPHEDRINE SULFATE-NACL 50-0.9 MG/10ML-% IV SOSY
PREFILLED_SYRINGE | INTRAVENOUS | Status: DC | PRN
  Administered 2021-05-25 (×4): 5 mg via INTRAVENOUS

## 2021-05-25 MED ORDER — OXYCODONE HCL 5 MG PO TABS: 5.0000 mg | ORAL_TABLET | ORAL | Status: DC | PRN

## 2021-05-25 MED ORDER — SODIUM CHLORIDE 0.9% FLUSH: 3.0000 mL | Freq: Two times a day (BID) | INTRAVENOUS | Status: DC

## 2021-05-25 MED ORDER — ORAL CARE MOUTH RINSE: 15.0000 mL | Freq: Once | OROMUCOSAL | Status: AC

## 2021-05-25 MED ORDER — DOCUSATE SODIUM 100 MG PO CAPS: 100.0000 mg | ORAL_CAPSULE | Freq: Two times a day (BID) | ORAL | 0 refills | Status: AC

## 2021-05-25 MED ORDER — GABAPENTIN 300 MG PO CAPS
300.0000 mg | ORAL_CAPSULE | ORAL | Status: AC
  Administered 2021-05-25: 300 mg via ORAL
  Filled 2021-05-25: qty 1

## 2021-05-25 MED ORDER — MIDAZOLAM HCL 2 MG/2ML IJ SOLN
INTRAMUSCULAR | Status: AC
  Filled 2021-05-25: qty 2

## 2021-05-25 MED ORDER — PHENYLEPHRINE 40 MCG/ML (10ML) SYRINGE FOR IV PUSH (FOR BLOOD PRESSURE SUPPORT)
PREFILLED_SYRINGE | INTRAVENOUS | Status: DC | PRN
  Administered 2021-05-25: 80 ug via INTRAVENOUS
  Administered 2021-05-25 (×2): 120 ug via INTRAVENOUS
  Administered 2021-05-25: 80 ug via INTRAVENOUS

## 2021-05-25 MED ORDER — CHLORHEXIDINE GLUCONATE 4 % EX LIQD: 60.0000 mL | Freq: Once | CUTANEOUS | Status: DC

## 2021-05-25 MED ORDER — SUGAMMADEX SODIUM 200 MG/2ML IV SOLN
INTRAVENOUS | Status: DC | PRN
  Administered 2021-05-25: 200 mg via INTRAVENOUS

## 2021-05-25 MED ORDER — FENTANYL CITRATE (PF) 250 MCG/5ML IJ SOLN
INTRAMUSCULAR | Status: AC
  Filled 2021-05-25: qty 5

## 2021-05-25 MED ORDER — PROPOFOL 10 MG/ML IV BOLUS
INTRAVENOUS | Status: AC
  Filled 2021-05-25: qty 20

## 2021-05-25 MED ORDER — SODIUM CHLORIDE 0.9% FLUSH: 3.0000 mL | INTRAVENOUS | Status: DC | PRN

## 2021-05-25 MED ORDER — CHLORHEXIDINE GLUCONATE 0.12 % MT SOLN
15.0000 mL | Freq: Once | OROMUCOSAL | Status: AC
  Administered 2021-05-25: 15 mL via OROMUCOSAL

## 2021-05-25 MED ORDER — OXYCODONE HCL 5 MG PO TABS
5.0000 mg | ORAL_TABLET | Freq: Once | ORAL | Status: AC | PRN
  Administered 2021-05-25: 5 mg via ORAL

## 2021-05-25 MED ORDER — FENTANYL CITRATE (PF) 100 MCG/2ML IJ SOLN
INTRAMUSCULAR | Status: DC | PRN
  Administered 2021-05-25: 50 ug via INTRAVENOUS

## 2021-05-25 MED ORDER — OXYCODONE HCL 5 MG PO TABS
ORAL_TABLET | ORAL | Status: AC
  Filled 2021-05-25: qty 1

## 2021-05-25 MED ORDER — ONDANSETRON HCL 4 MG/2ML IJ SOLN: 4.0000 mg | Freq: Once | INTRAMUSCULAR | Status: DC | PRN

## 2021-05-25 MED ORDER — KETOROLAC TROMETHAMINE 30 MG/ML IJ SOLN: 30.0000 mg | Freq: Once | INTRAMUSCULAR | Status: AC | PRN

## 2021-05-25 MED ORDER — BUPIVACAINE-EPINEPHRINE 0.25% -1:200000 IJ SOLN
INTRAMUSCULAR | Status: DC | PRN
  Administered 2021-05-25: 30 mL

## 2021-05-25 MED ORDER — DEXAMETHASONE SODIUM PHOSPHATE 10 MG/ML IJ SOLN
INTRAMUSCULAR | Status: AC
  Filled 2021-05-25: qty 1

## 2021-05-25 MED ORDER — 0.9 % SODIUM CHLORIDE (POUR BTL) OPTIME
TOPICAL | Status: DC | PRN
  Administered 2021-05-25: 1000 mL

## 2021-05-25 MED ORDER — BUPIVACAINE LIPOSOME 1.3 % IJ SUSP: 20.0000 mL | Freq: Once | INTRAMUSCULAR | Status: DC

## 2021-05-25 MED ORDER — CEFAZOLIN SODIUM-DEXTROSE 2-4 GM/100ML-% IV SOLN
2.0000 g | INTRAVENOUS | Status: AC
  Administered 2021-05-25: 2 g via INTRAVENOUS
  Filled 2021-05-25: qty 100

## 2021-05-25 MED ORDER — LIDOCAINE HCL (PF) 2 % IJ SOLN
INTRAMUSCULAR | Status: DC | PRN
  Administered 2021-05-25: 100 mg via INTRADERMAL

## 2021-05-25 MED ORDER — BUPIVACAINE-EPINEPHRINE (PF) 0.25% -1:200000 IJ SOLN
INTRAMUSCULAR | Status: AC
  Filled 2021-05-25: qty 30

## 2021-05-25 MED ORDER — ACETAMINOPHEN 325 MG PO TABS: 650.0000 mg | ORAL_TABLET | ORAL | Status: DC | PRN

## 2021-05-25 MED ORDER — BUPIVACAINE LIPOSOME 1.3 % IJ SUSP
INTRAMUSCULAR | Status: DC | PRN
  Administered 2021-05-25: 20 mL

## 2021-05-25 MED ORDER — ROCURONIUM BROMIDE 10 MG/ML (PF) SYRINGE
PREFILLED_SYRINGE | INTRAVENOUS | Status: DC | PRN
  Administered 2021-05-25: 60 mg via INTRAVENOUS
  Administered 2021-05-25: 20 mg via INTRAVENOUS

## 2021-05-25 MED ORDER — DEXAMETHASONE SODIUM PHOSPHATE 10 MG/ML IJ SOLN
INTRAMUSCULAR | Status: DC | PRN
  Administered 2021-05-25: 5 mg via INTRAVENOUS

## 2021-05-25 MED ORDER — KETAMINE HCL 10 MG/ML IJ SOLN
INTRAMUSCULAR | Status: AC
  Filled 2021-05-25: qty 1

## 2021-05-25 MED ORDER — PROPOFOL 10 MG/ML IV BOLUS
INTRAVENOUS | Status: DC | PRN
  Administered 2021-05-25: 150 mg via INTRAVENOUS

## 2021-05-25 MED ORDER — FENTANYL CITRATE PF 50 MCG/ML IJ SOSY: 25.0000 ug | PREFILLED_SYRINGE | INTRAMUSCULAR | Status: DC | PRN

## 2021-05-25 MED ORDER — KETAMINE HCL 10 MG/ML IJ SOLN
INTRAMUSCULAR | Status: DC | PRN
  Administered 2021-05-25: 30 mg via INTRAVENOUS
  Administered 2021-05-25: 10 mg via INTRAVENOUS

## 2021-05-25 MED ORDER — ACETAMINOPHEN 650 MG RE SUPP
650.0000 mg | RECTAL | Status: DC | PRN
  Filled 2021-05-25: qty 1

## 2021-05-25 MED ORDER — SODIUM CHLORIDE 0.9 % IV SOLN: 250.0000 mL | INTRAVENOUS | Status: DC | PRN

## 2021-05-25 MED ORDER — EPHEDRINE 5 MG/ML INJ
INTRAVENOUS | Status: AC
  Filled 2021-05-25: qty 5

## 2021-05-25 MED ORDER — ONDANSETRON HCL 4 MG/2ML IJ SOLN
INTRAMUSCULAR | Status: DC | PRN
  Administered 2021-05-25: 4 mg via INTRAVENOUS

## 2021-05-25 MED ORDER — LACTATED RINGERS IV SOLN
INTRAVENOUS | Status: DC
Start: 2021-05-25 — End: 2021-05-25

## 2021-05-25 MED ORDER — ONDANSETRON HCL 4 MG/2ML IJ SOLN
INTRAMUSCULAR | Status: AC
  Filled 2021-05-25: qty 2

## 2021-05-25 MED ORDER — PHENYLEPHRINE 40 MCG/ML (10ML) SYRINGE FOR IV PUSH (FOR BLOOD PRESSURE SUPPORT)
PREFILLED_SYRINGE | INTRAVENOUS | Status: AC
  Filled 2021-05-25: qty 10

## 2021-05-25 SURGICAL SUPPLY — 43 items
APL PRP STRL LF DISP 70% ISPRP (MISCELLANEOUS) ×1
APL SKNCLS STERI-STRIP NONHPOA (GAUZE/BANDAGES/DRESSINGS) ×1
BAG COUNTER SPONGE SURGICOUNT (BAG) IMPLANT
BAG SPNG CNTER NS LX DISP (BAG)
BENZOIN TINCTURE PRP APPL 2/3 (GAUZE/BANDAGES/DRESSINGS) ×2 IMPLANT
BLADE SURG 15 STRL LF DISP TIS (BLADE) ×1 IMPLANT
BLADE SURG 15 STRL SS (BLADE) ×2
CHLORAPREP W/TINT 26 (MISCELLANEOUS) ×2 IMPLANT
COVER SURGICAL LIGHT HANDLE (MISCELLANEOUS) ×2 IMPLANT
DECANTER SPIKE VIAL GLASS SM (MISCELLANEOUS) ×2 IMPLANT
DRAIN PENROSE 0.5X18 (DRAIN) ×2 IMPLANT
DRAPE LAPAROSCOPIC ABDOMINAL (DRAPES) ×2 IMPLANT
ELECT REM PT RETURN 15FT ADLT (MISCELLANEOUS) ×2 IMPLANT
GAUZE SPONGE 4X4 12PLY STRL (GAUZE/BANDAGES/DRESSINGS) ×4 IMPLANT
GLOVE SURG ENC MOIS LTX SZ6 (GLOVE) ×2 IMPLANT
GLOVE SURG MICRO LTX SZ6 (GLOVE) ×2 IMPLANT
GLOVE SURG UNDER LTX SZ6.5 (GLOVE) ×2 IMPLANT
GOWN STRL REUS W/TWL LRG LVL3 (GOWN DISPOSABLE) ×2 IMPLANT
GOWN STRL REUS W/TWL XL LVL3 (GOWN DISPOSABLE) ×2 IMPLANT
KIT BASIN OR (CUSTOM PROCEDURE TRAY) ×2 IMPLANT
KIT TURNOVER KIT A (KITS) ×2 IMPLANT
MESH ULTRAPRO 3X6 7.6X15CM (Mesh General) ×2 IMPLANT
NEEDLE HYPO 22GX1.5 SAFETY (NEEDLE) ×2 IMPLANT
PACK BASIC VI WITH GOWN DISP (CUSTOM PROCEDURE TRAY) ×2 IMPLANT
PENCIL SMOKE EVACUATOR (MISCELLANEOUS) IMPLANT
SPONGE T-LAP 4X18 ~~LOC~~+RFID (SPONGE) ×2 IMPLANT
STRIP CLOSURE SKIN 1/2X4 (GAUZE/BANDAGES/DRESSINGS) ×2 IMPLANT
SUT ETHIBOND 0 MO6 C/R (SUTURE) ×2 IMPLANT
SUT MNCRL AB 4-0 PS2 18 (SUTURE) ×2 IMPLANT
SUT PDS AB 0 CT1 36 (SUTURE) ×4 IMPLANT
SUT SILK 3 0 (SUTURE) ×2
SUT SILK 3-0 18XBRD TIE 12 (SUTURE) ×1 IMPLANT
SUT VIC AB 3-0 SH 27 (SUTURE) ×4
SUT VIC AB 3-0 SH 27XBRD (SUTURE) ×2 IMPLANT
SUT VICRYL 0 UR6 27IN ABS (SUTURE) IMPLANT
SUT VICRYL 3 0 BR 18  UND (SUTURE) ×2
SUT VICRYL 3 0 BR 18 UND (SUTURE) ×1 IMPLANT
SYR CONTROL 10ML LL (SYRINGE) ×2 IMPLANT
TAPE CLOTH SURG 4X10 WHT LF (GAUZE/BANDAGES/DRESSINGS) ×2 IMPLANT
TAPE CLOTH SURG 6X10 WHT LF (GAUZE/BANDAGES/DRESSINGS) ×2 IMPLANT
TOWEL OR 17X26 10 PK STRL BLUE (TOWEL DISPOSABLE) ×2 IMPLANT
TOWEL OR NON WOVEN STRL DISP B (DISPOSABLE) ×2 IMPLANT
TRAY FOLEY MTR SLVR 16FR STAT (SET/KITS/TRAYS/PACK) IMPLANT

## 2021-05-25 NOTE — Op Note (Signed)
Operative Note  Jason Maldonado  [id number]  [id number]  05/25/2021   Surgeon: Phylliss Blakes MD   Assistant: Basilio Cairo MD PGY3. I was personally present during the key and critical portions of this procedure and immediately available throughout the entire procedure, as documented in my operative note.    Procedure performed: Open repair left indirect inguinal hernia, epigastric hernia repair   Preop diagnosis: Supraumbilical and left inguinal hernias Post-op diagnosis/intraop findings: Large indirect left inguinal hernia with essentially absent inguinal floor, supraumbilical hernia comprising of 2 adjacent transverse slits containing incarcerated preperitoneal fat   Specimens: no Retained items: no  EBL: minimal cc Complications: none   Description of procedure: After obtaining informed consent the patient was taken to the operating room and placed supine on operating room table where general endotracheal anesthesia was initiated, preoperative antibiotics were administered, SCDs applied, and a formal timeout was performed.  The abdomen and groin was clipped, prepped and draped in usual sterile fashion.  After infiltration with local (Exparel mixed with quarter percent Marcaine with epinephrine) we made a oblique incision in the left groin over the inguinal ligament and the soft tissues were dissected with cautery until the external oblique aponeurosis was encountered.  This was divided sharply to expand the external ring.  The superior aspect of the external oblique aponeurosis was essentially fused to the internal oblique and rectus fascia.  The spermatic cord was encircled with a Penrose and then a combination of blunt and cautery dissection were employed to free the large indirect hernia sac from the spermatic cord structures, skeletonizing the sac down to the level of the internal ring where it was reduced into the abdomen.  There was a small cord lipoma which was excised as well.  Cord  structures were inspected and confirmed to be free of any injury.  The inguinal floor was reconstructed with a running 0 PDS, narrowing the internal ring to a diameter just sufficient for the cord structures to exit.  A 3 x 6 ultra Pro mesh was then selected, trimmed to the field and sutured to the pubic tubercle, inferior shelving edge, and internal oblique with simple interrupted 0 Ethibonds.  The tails of the mesh were wrapped around the cord and directed laterally to lie flat beneath the external oblique aponeurosis.  These were sutured to each other with interrupted 0 Ethibond again creating an opening just sufficient for the cord structures to pass through.  Additional local was infiltrated along the pubic tubercle and in the plane beneath the external oblique aponeurosis.  The external oblique was then closed with a running 3-0 Vicryl.  The Scarpa was reapproximated with interrupted 3-0 Vicryl's and the skin was closed with running subcuticular 4-0 Monocryl.  Benzoin, Steri-Strips and a light pressure dressing were applied.  We then turned to the supraumbilical hernia.  A 2.5 cm vertical supraumbilical incision was made in the soft tissues dissected with cautery until the incarcerated hernia contents were identified.  This comprised of preperitoneal fat and this was able to be reduced into the abdomen after clearing off the fascia circumferentially.  There was noted to be 2 transverse slit like fascial defects connected by a small bridge.  Total hernia defect size was approximately 1 x 1.5 cm.  This was closed transversely, imbricating the connecting bridge, with simple interrupted 0 Ethibonds.  Local was infiltrated throughout the fascia and subcutaneous tissue.  The deeper tissues were then reapproximated with 3-0 Vicryls and the skin closed with running subcuticular 4  Monocryl followed by the application of benzoin, Steri-Strips, and a light pressure dressing.  The patient was then awakened, extubated and  taken to PACU in stable condition.    All counts were correct at the completion of the case.

## 2021-05-25 NOTE — Anesthesia Postprocedure Evaluation (Signed)
Anesthesia Post Note  Patient: Jason Maldonado  Procedure(s) Performed: OPEN LEFT INGUINAL HERNIA AND EPIGASTRIC HERNIA REPAIR (Left: Abdomen)     Patient location during evaluation: PACU Anesthesia Type: General Level of consciousness: awake and alert Pain management: pain level controlled Vital Signs Assessment: post-procedure vital signs reviewed and stable Respiratory status: spontaneous breathing, nonlabored ventilation, respiratory function stable and patient connected to nasal cannula oxygen Cardiovascular status: blood pressure returned to baseline and stable Postop Assessment: no apparent nausea or vomiting Anesthetic complications: no   No notable events documented.  Last Vitals:  Vitals:   05/25/21 1615 05/25/21 1616  BP: (!) 140/100 125/76  Pulse: 89 89  Resp: (!) 21 15  Temp: 36.5 C   SpO2: 98% 98%    Last Pain:  Vitals:   05/25/21 1614  TempSrc:   PainSc: 0-No pain                 [name] S

## 2021-05-25 NOTE — Anesthesia Procedure Notes (Addendum)
Procedure Name: Intubation Date/Time: 05/25/2021 2:12 PM Performed by: Ezekiel Ina, CRNA Pre-anesthesia Checklist: Patient identified, Emergency Drugs available, Suction available and Patient being monitored Patient Re-evaluated:Patient Re-evaluated prior to induction Oxygen Delivery Method: Circle system utilized Preoxygenation: Pre-oxygenation with 100% oxygen Induction Type: IV induction Ventilation: Mask ventilation without difficulty Laryngoscope Size: Miller and 2 Grade View: Grade II Tube type: Oral Tube size: 7.5 mm Number of attempts: 1 Airway Equipment and Method: Stylet Placement Confirmation: ETT inserted through vocal cords under direct vision, positive ETCO2 and breath sounds checked- equal and bilateral Secured at: 23 cm Tube secured with: Tape Dental Injury: Teeth and Oropharynx as per pre-operative assessment  Comments: Grade 2b view with Mil2. Patient has anterior larynx

## 2021-05-25 NOTE — Transfer of Care (Signed)
Immediate Anesthesia Transfer of Care Note  Patient: Jason Maldonado  Procedure(s) Performed: OPEN LEFT INGUINAL HERNIA AND EPIGASTRIC HERNIA REPAIR (Left: Abdomen)  Patient Location: PACU  Anesthesia Type:General  Level of Consciousness: drowsy  Airway & Oxygen Therapy: Patient Spontanous Breathing  Post-op Assessment: Report given to RN and Post -op Vital signs reviewed and stable  Post vital signs: Reviewed and stable  Last Vitals:  Vitals Value Taken Time  BP 141/90 05/25/21 1613  Temp    Pulse 89 05/25/21 1615  Resp 21 05/25/21 1615  SpO2 98 % 05/25/21 1615  Vitals shown include unvalidated device data.  Last Pain:  Vitals:   05/25/21 1259  TempSrc: Oral  PainSc:       Patients Stated Pain Goal: 4 (05/25/21 1150)  Complications: No notable events documented.

## 2021-05-25 NOTE — Discharge Instructions (Signed)
HERNIA REPAIR: POST OP INSTRUCTIONS   EAT Gradually transition to a high fiber diet with a fiber supplement over the next few weeks after discharge.  Start with a pureed / full liquid diet (see below)  WALK Walk an hour a day (cumulative- not all at once).  Control your pain to do that.    CONTROL PAIN Control pain so that you can walk, sleep, tolerate sneezing/coughing, and go up/down stairs.  HAVE A BOWEL MOVEMENT DAILY Keep your bowels regular to avoid problems.  OK to try a laxative to override constipation.  OK to use an antidairrheal to slow down diarrhea.  Call if not better after 2 tries  CALL IF YOU HAVE PROBLEMS/CONCERNS Call if you are still struggling despite following these instructions. Call if you have concerns not answered by these instructions  ######################################################################    DIET: Follow a light bland diet & liquids the first 24 hours after arrival home, such as soup, liquids, starches, etc.  Be sure to drink plenty of fluids.  Quickly advance to a usual solid diet within a few days.  Avoid fast food or heavy meals as your are more likely to get nauseated or have irregular bowels.  A low-sugar, high-fiber diet for the rest of your life is ideal.   Take your usually prescribed home medications unless otherwise directed.  PAIN CONTROL: Pain is best controlled by a usual combination of three different methods TOGETHER: Ice/Heat Over the counter pain medication Prescription pain medication Most patients will experience some swelling and bruising around the hernia(s) such as the bellybutton, groins, or old incisions.  Ice packs or heating pads (30-60 minutes up to 6 times a day) will help. Use ice for the first few days to help decrease swelling and bruising, then switch to heat to help relax tight/sore spots and speed recovery.  Some people prefer to use ice alone, heat alone, alternating between ice & heat.  Experiment to what  works for you.  Swelling and bruising can take several weeks to resolve.   It is helpful to take an over-the-counter pain medication regularly for the first days: Naproxen (Aleve, etc)  Two 220mg tabs twice a day OR Ibuprofen (Advil, etc) Three 200mg tabs four times a day (every meal & bedtime) AND Acetaminophen (Tylenol, etc) 325-650mg four times a day (every meal & bedtime) A  prescription for pain medication should be given to you upon discharge.  Take your pain medication as prescribed, IF NEEDED.  If you are having problems/concerns with the prescription medicine (does not control pain, nausea, vomiting, rash, itching, etc), please call us [phone] to see if we need to switch you to a different pain medicine that will work better for you and/or control your side effect better. If you need a refill on your pain medication, please contact your pharmacy.  They will contact our office to request authorization. Prescriptions will not be filled after 5 pm or on week-ends.  Avoid getting constipated.  Between the surgery and the pain medications, it is common to experience some constipation.  Increasing fluid intake and taking a fiber supplement (such as Metamucil, Citrucel, FiberCon, MiraLax, etc) 1-2 times a day regularly will usually help prevent this problem from occurring.  A mild laxative (prune juice, Milk of Magnesia, MiraLax, etc) should be taken according to package directions if there are no bowel movements after 48 hours.    Wash / shower every day, starting 2 days after surgery.  You may shower over the   steri strips which are waterproof.    Remove your outer bandage 2 days after surgery.  You may leave the incision open to air.  You may replace a dressing/Band-Aid to cover an incision for comfort if you wish.  Continue to shower over incision(s) after the dressing is off.  ACTIVITIES as tolerated:   You may resume regular (light) daily activities beginning the next day--such as daily  self-care, walking, climbing stairs--gradually increasing activities as tolerated.  Control your pain so that you can walk an hour a day.  If you can walk 30 minutes without difficulty, it is safe to try more intense activity such as jogging, treadmill, bicycling, low-impact aerobics, swimming, etc. Refrain from the most intensive and strenuous activity such as sit-ups, heavy lifting, contact sports, etc  Refrain from any heavy lifting or straining until 6 weeks after surgery.   DO NOT PUSH THROUGH PAIN.  Let pain be your guide: If it hurts to do something, don't do it.  Pain is your body warning you to avoid that activity for another week until the pain goes down. You may drive when you are no longer taking prescription pain medication, you can comfortably wear a seatbelt, and you can safely maneuver your car and apply brakes. You may have sexual intercourse when it is comfortable.   FOLLOW UP in our office Please call [facility] at [phone] to set up an appointment to see your surgeon in the office for a follow-up appointment approximately 2-3 weeks after your surgery. Make sure that you call for this appointment the day you arrive home to insure a convenient appointment time.  9.  If you have disability of FMLA / Family leave forms, please bring the forms to the office for processing.  (do not give to your surgeon).  WHEN TO CALL US [phone]: Poor pain control Reactions / problems with new medications (rash/itching, nausea, etc)  Fever over 101.5 F (38.5 C) Inability to urinate Nausea and/or vomiting Worsening swelling or bruising Continued bleeding from incision. Increased pain, redness, or drainage from the incision   The clinic staff is available to answer your questions during regular business hours (8:30am-5pm).  Please don't hesitate to call and ask to speak to one of our nurses for clinical concerns.   If you have a medical emergency, go to the nearest emergency room or call  911.  A surgeon from [facility] is always on call at the hospitals in [place]  [facility] [address], [address], [place], [place]  [address] ?  [address] [address], [place],    [address] MAIN: [phone] ? TOLL FREE: [phone] ? FAX: [phone] [email or web address]  

## 2021-05-25 NOTE — H&P (Signed)
Jason Maldonado [id number]    Referring Provider:  Swaziland, Peter Manning, *     Subjective    Chief Complaint: Hernia       History of Present Illness:    65 year old gentleman presents for evaluation of possible inguinal hernia.  He has a history of coronary artery disease with previous MI and RCA stent, bipolar disorder, tobacco abuse.  He is on aspirin and Brilinta.  Last echo was October 2016 with EF 50 to 55%. In about April of this year, he had a tearing sensation and severe pain in the left groin.  Subsequent to that he started to notice a bulge in that area which he is able to reduce with upward inward pressure.  Denies any obstructive symptoms or urinary symptoms.  He works on [employer] and his job requires him to bend and twist but not necessarily do any heavy lifting.  He does smoke but has cut down on this a fair amount. Previous abdominal surgery includes appendectomy when he was 18 or 65 years old.     Review of Systems: A complete review of systems was obtained from the patient.  I have reviewed this information and discussed as appropriate with the patient.  See HPI as well for other ROS.     Medical History:     Past Medical History:  Diagnosis Date   CHF (congestive heart failure) (CMS-HCC)     Hyperlipidemia        There is no problem list on file for this patient.          Past Surgical History:  Procedure Laterality Date   APPENDECTOMY          No Known Allergies         Current Outpatient Medications on File Prior to Visit  Medication Sig Dispense Refill   BRILINTA 60 mg tablet Take 60 mg by mouth 2 (two) times daily       carvediloL (COREG) 3.125 MG tablet TAKE 1 TABLET (3.125 MG TOTAL) BY MOUTH 2 (TWO) TIMES DAILY WITH A MEAL.       LIVALO 2 mg Tab Take 1 tablet by mouth once daily       NEXLIZET 180-10 mg Tab Take 1 tablet by mouth once daily       nitroGLYcerin (NITROSTAT) 0.4 MG SL tablet as directed       sildenafiL (VIAGRA) 50 MG tablet           No current facility-administered medications on file prior to visit.           Family History  Problem Relation Age of Onset   Diabetes Mother     Hyperlipidemia (Elevated cholesterol) Father     Heart valve disease Father        Social History        Tobacco Use  Smoking Status Current Every Day Smoker   Packs/day: 0.50   Types: Cigarettes  Smokeless Tobacco Never Used      Social History         Socioeconomic History   Marital status: Married  Tobacco Use   Smoking status: Current Every Day Smoker      Packs/day: 0.50      Types: Cigarettes   Smokeless tobacco: Never Used  Substance and Sexual Activity   Alcohol use: Yes   Drug use: Never      Objective:         Vitals:    03/25/21 1542  Pulse:  83  Temp: 37.3 C (99.1 F)  SpO2: 98%  Weight: 70 kg (154 lb 6.4 oz)  Height: 170.2 cm (5\' 7" )    Body mass index is 24.18 kg/m.   Alert, well-appearing Unlabored respirations Abdomen is soft and nontender.  There is a partially reducible supraumbilical hernia which is minimally tender.  There is a reducible moderate sized left inguinal hernia.  No hernia on the right.   Assessment and Plan:  Diagnoses and all orders for this visit:   Inguinal hernia without obstruction or gangrene, recurrence not specified, unspecified laterality   Epigastric hernia     I recommend an open repair for both of these.  We will request cardiac clearance and clearance to hold the Brilinta. We discussed the relevant anatomy and we discussed the technique of the procedure.  Discussed risks of bleeding, infection, pain, scarring, injury to structures in the area including nerves, blood vessels, bowel, bladder, risk of chronic pain, hernia recurrence, risk of seroma or hematoma, urinary retention, and risks of general anesthesia including cardiovascular, pulmonary, and thromboembolic complications.  Discussed activity restrictions in the postop period.  Questions were  answered.  Patient wishes to proceed with scheduling.

## 2021-05-26 ENCOUNTER — Encounter (HOSPITAL_COMMUNITY): Payer: Self-pay | Admitting: Surgery

## 2021-06-01 ENCOUNTER — Other Ambulatory Visit: Payer: Self-pay | Admitting: Cardiology

## 2021-06-02 ENCOUNTER — Other Ambulatory Visit: Payer: Self-pay | Admitting: Cardiology

## 2021-06-08 ENCOUNTER — Telehealth: Payer: Self-pay | Admitting: Cardiology

## 2021-06-08 MED ORDER — LIVALO 2 MG PO TABS: 1.0000 | ORAL_TABLET | Freq: Every day | ORAL | 1 refills | Status: DC

## 2021-06-08 NOTE — Telephone Encounter (Signed)
*  STAT* If patient is at the pharmacy, call can be transferred to refill team.   1. Which medications need to be refilled? (please list name of each medication and dose if known)  LIVALO 2 MG TABS  2. Which pharmacy/location (including street and city if local pharmacy) is medication to be sent to? [id number] - [place], [place] - [address]  3. Do they need a 30 day or 90 day supply? 90 day supply

## 2021-06-08 NOTE — Telephone Encounter (Signed)
Refills has been sent to the pharmacy. 

## 2021-11-04 ENCOUNTER — Other Ambulatory Visit: Payer: Self-pay | Admitting: Cardiology

## 2021-11-04 ENCOUNTER — Other Ambulatory Visit: Payer: Self-pay | Admitting: Physician Assistant

## 2021-11-30 ENCOUNTER — Telehealth: Payer: Self-pay | Admitting: Physician Assistant

## 2021-11-30 ENCOUNTER — Other Ambulatory Visit: Payer: Self-pay | Admitting: Physician Assistant

## 2021-11-30 MED ORDER — BUPROPION HCL 75 MG PO TABS: 75.0000 mg | ORAL_TABLET | Freq: Every day | ORAL | 0 refills | Status: DC

## 2021-11-30 NOTE — Telephone Encounter (Signed)
Prescription for 1 month sent.

## 2021-11-30 NOTE — Telephone Encounter (Signed)
Patient called in for refill on Wellbutrin 75mg . States he has been out for about three days. Ph: [phone] Appt 4/24 Pharmacy [facility] [address] [place] ?

## 2021-12-05 ENCOUNTER — Ambulatory Visit: Payer: BC Managed Care – PPO | Admitting: Physician Assistant

## 2021-12-05 ENCOUNTER — Encounter: Payer: Self-pay | Admitting: Physician Assistant

## 2021-12-05 DIAGNOSIS — F3342 Major depressive disorder, recurrent, in full remission: Secondary | ICD-10-CM | POA: Diagnosis not present

## 2021-12-05 DIAGNOSIS — F411 Generalized anxiety disorder: Secondary | ICD-10-CM

## 2021-12-05 MED ORDER — BUPROPION HCL 75 MG PO TABS: 75.0000 mg | ORAL_TABLET | Freq: Every day | ORAL | 1 refills | Status: DC

## 2021-12-05 NOTE — Progress Notes (Signed)
[facility] ? ?Patient ID: Jason Maldonado,  ?MRN: [id number] ? ?PCP: Patient, No Pcp Per (Inactive) ? ?Date of Evaluation: 12/05/2021 ?Time spent:20 minutes ? ?Chief Complaint:  ?Chief Complaint   ?Depression; Follow-up ?  ? ? ?HISTORY/CURRENT STATUS: ?For routine med check. Lost to f/u for 9 months.  ? ?Didn't come in b/c "I had a hernia repair." Just ran out a few days ago. States it's still working well. Continues to work full-time. 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  Energy and motivation are good.  States he feels well physically and cardiac status is at baseline.  ADLs and personal hygiene are normal.  No suicidal or homicidal thoughts. ? ?Still smokes about 1/2 pack/day.  Not ready to quit.  Doubts he ever will. ? ?Patient denies increased energy with decreased need for sleep, no increased talkativeness, no racing thoughts, no impulsivity or risky behaviors, no increased spending, no increased libido, no grandiosity, no increased irritability or anger, and no hallucinations. ? ?Denies dizziness, syncope, seizures, numbness, tingling, tremor, tics, unsteady gait, slurred speech, confusion. Denies muscle or joint pain, stiffness, or dystonia. ? ?Individual Medical History/ Review of Systems: Changes? :Yes   had inguinal and umbilical hernia repair 06/17/2021.  ? ?Past medications for mental health diagnoses include: ?Wellbutrin, Xanax, Cymbalta, symbyax, Seroquel, Latuda, Lamictal, Depakote, Risperdal, Prozac, Effexor, Abilify ? ?Allergies: Patient has no known allergies. ? ?Current Medications:  ?Current Outpatient Medications:  ?  aspirin EC 81 MG EC tablet, Take 1 tablet (81 mg total) by mouth daily., Disp: , Rfl:  ?  BRILINTA 60 MG TABS tablet, TAKE 1 TABLET BY MOUTH TWICE A DAY, Disp: 180 tablet, Rfl: 3 ?  carvedilol (COREG) 3.125 MG tablet, TAKE 1 TABLET (3.125 MG TOTAL) BY MOUTH 2 (TWO) TIMES DAILY WITH A MEAL., Disp:  180 tablet, Rfl: 3 ?  ibuprofen (ADVIL) 200 MG tablet, Take 200 mg by mouth daily as needed (pain)., Disp: , Rfl:  ?  loratadine (CLARITIN) 10 MG tablet, Take 10 mg by mouth daily., Disp: , Rfl:  ?  NEXLIZET 180-10 MG TABS, TAKE 1 TABLET BY MOUTH EVERY DAY, Disp: 90 tablet, Rfl: 3 ?  nitroGLYCERIN (NITROSTAT) 0.4 MG SL tablet, Take as needed for chest pain. 1 tablet under tongue every 5 mins up to 3 tablets.After 3 tablets if chest pain call 911., Disp: 25 tablet, Rfl: 11 ?  Pitavastatin Calcium (LIVALO) 2 MG TABS, Take 1 tablet (2 mg total) by mouth daily., Disp: 180 tablet, Rfl: 1 ?  buPROPion (WELLBUTRIN) 75 MG tablet, Take 1 tablet (75 mg total) by mouth daily., Disp: 90 tablet, Rfl: 1 ?Medication Side Effects: none ? ?Family Medical/ Social History: Changes? No ? ?MENTAL HEALTH EXAM: ? ?There were no vitals taken for this visit.There is no height or weight on file to calculate BMI.  ?General Appearance: Casual, Neat and Well Groomed  ?Eye Contact:  Good  ?Speech:  Clear and Coherent and Normal Rate  ?Volume:  Normal  ?Mood:  Euthymic  ?Affect:  Appropriate  ?Thought Process:  Goal Directed and Descriptions of Associations: Circumstantial  ?Orientation:  Full (Time, Place, and Person)  ?Thought Content: Logical   ?Suicidal Thoughts:  No  ?Homicidal Thoughts:  No  ?Memory:  WNL  ?Judgement:  Good  ?Insight:  Good  ?Psychomotor Activity:  Normal  ?Concentration:  Concentration: Good and Attention Span: Good  ?Recall:  Good  ?Fund of Knowledge: Good  ?  Language: Good  ?Assets:  Desire for Improvement  ?ADL's:  Intact  ?Cognition: WNL  ?Prognosis:  Good  ? ? ?DIAGNOSES:  ?  ICD-10-CM   ?1. Recurrent major depressive disorder, in full remission (HCC)  F33.42   ?  ?2. Generalized anxiety disorder  F41.1   ?  ? ? ? ?Receiving Psychotherapy: No  ? ? ?RECOMMENDATIONS:  ?PDMP reviewed. Oxycodone Rx 05/25/2021. ?I provided 20 minutes of face to face time during this encounter, including time spent before and after the visit  in records review, medical decision making, counseling pertinent to today's visit, and charting.  ?He is doing well as far as the Wellbutrin goes so no changes will be made. ?Smoking cessation discussed.  He does not want to quit. ? ?Continue Wellbutrin 75 mg, 1 p.o. daily. ?Return in 6 months.  ? ?Melony Overly, PA-C  ?

## 2022-01-17 ENCOUNTER — Telehealth: Payer: Self-pay | Admitting: Cardiology

## 2022-01-17 DIAGNOSIS — E7800 Pure hypercholesterolemia, unspecified: Secondary | ICD-10-CM

## 2022-01-17 DIAGNOSIS — I2510 Atherosclerotic heart disease of native coronary artery without angina pectoris: Secondary | ICD-10-CM

## 2022-01-17 NOTE — Telephone Encounter (Signed)
Patient is requesting orders for lab work. 

## 2022-01-17 NOTE — Telephone Encounter (Signed)
*  STAT* If patient is at the pharmacy, call can be transferred to refill team.   1. Which medications need to be refilled? (please list name of each medication and dose if known)  BRILINTA 60 MG TABS tablet  2. Which pharmacy/location (including street and city if local pharmacy) is medication to be sent to? [id number] - [place], [place] - [address]  3. Do they need a 30 day or 90 day supply?  90 day supply

## 2022-01-17 NOTE — Telephone Encounter (Signed)
Routed to Dr. [place] to advise on lab work order request  Appointment Sept 2023

## 2022-01-18 MED ORDER — TICAGRELOR 60 MG PO TABS: 60.0000 mg | ORAL_TABLET | Freq: Two times a day (BID) | ORAL | 0 refills | Status: DC

## 2022-01-18 NOTE — Telephone Encounter (Signed)
CBC, CMET, Lipid ordered per MD. Mailed to patient

## 2022-04-22 ENCOUNTER — Other Ambulatory Visit: Payer: Self-pay | Admitting: Cardiology

## 2022-04-25 DIAGNOSIS — E7800 Pure hypercholesterolemia, unspecified: Secondary | ICD-10-CM | POA: Diagnosis not present

## 2022-04-25 DIAGNOSIS — I2510 Atherosclerotic heart disease of native coronary artery without angina pectoris: Secondary | ICD-10-CM | POA: Diagnosis not present

## 2022-04-26 LAB — COMPREHENSIVE METABOLIC PANEL
ALT: 13 IU/L (ref 0–44)
AST: 17 IU/L (ref 0–40)
Albumin/Globulin Ratio: 2.9 — ABNORMAL HIGH (ref 1.2–2.2)
Albumin: 4.3 g/dL (ref 3.9–4.9)
Alkaline Phosphatase: 39 IU/L — ABNORMAL LOW (ref 44–121)
BUN/Creatinine Ratio: 13 (ref 10–24)
BUN: 17 mg/dL (ref 8–27)
Bilirubin Total: 0.4 mg/dL (ref 0.0–1.2)
CO2: 24 mmol/L (ref 20–29)
Calcium: 9.3 mg/dL (ref 8.6–10.2)
Chloride: 101 mmol/L (ref 96–106)
Creatinine, Ser: 1.35 mg/dL — ABNORMAL HIGH (ref 0.76–1.27)
Globulin, Total: 1.5 g/dL (ref 1.5–4.5)
Glucose: 79 mg/dL (ref 70–99)
Potassium: 4.9 mmol/L (ref 3.5–5.2)
Sodium: 137 mmol/L (ref 134–144)
Total Protein: 5.8 g/dL — ABNORMAL LOW (ref 6.0–8.5)
eGFR: 57 mL/min/{1.73_m2} — ABNORMAL LOW (ref >59)

## 2022-04-26 LAB — CBC
Hematocrit: 42.5 % (ref 37.5–51.0)
Hemoglobin: 14.2 g/dL (ref 13.0–17.7)
MCH: 27.3 pg (ref 26.6–33.0)
MCHC: 33.4 g/dL (ref 31.5–35.7)
MCV: 82 fL (ref 79–97)
Platelets: 209 10*3/uL (ref 150–450)
RBC: 5.21 x10E6/uL (ref 4.14–5.80)
RDW: 13.4 % (ref 11.6–15.4)
WBC: 6.6 10*3/uL (ref 3.4–10.8)

## 2022-04-26 LAB — LIPID PANEL
Chol/HDL Ratio: 2.8 ratio (ref 0.0–5.0)
Cholesterol, Total: 135 mg/dL (ref 100–199)
HDL: 49 mg/dL (ref >39)
LDL Chol Calc (NIH): 69 mg/dL (ref 0–99)
Triglycerides: 89 mg/dL (ref 0–149)
VLDL Cholesterol Cal: 17 mg/dL (ref 5–40)

## 2022-04-28 NOTE — Progress Notes (Unsigned)
CARDIOLOGY OFFICE NOTE  Date:  04/28/2022    Jason Maldonado Date of Birth: 1956/05/29 Medical Record #[id number]  PCP:  Patient, No Pcp Per  Cardiologist:  [place]    No chief complaint on file.   History of Present Illness: Jason Maldonado is a 66 y.o. male who is seen for follow up CAD.  He has a past medical history of hyperlipidemia, bipolar disorder, tobacco abuse, and a history of CAD status post previous inferior MI in 2014 with PCI to the RCA.   He presented to [facility] on 03/10/2015 as a inferior STEMI in the setting of RCA in-stent restenosis/occlusion. Cardiac catheterization on 03/10/2015 showed 100% stenosis of prox and mid RCA prox to a previously placed unknown stent, 4.038 mm Resolute DES stent postdilated to 4.41 mm proximally and 4.1 mm distally. 50% residual RPDA, 40% 1st RPLB, 40%distal RCA lesion. Post cath, he was placed on aspirin and Brilinta. Echo EF 30-35%, basal to mid inferior akinesis, inferoseptal and inferolateral severe hypokinesis. He did have refractory hypotension that gradually improved. Repeat Echo in October 2016 showed inferior HK with EF 50-55%.    On a prior  visit noted a vision change and was diagnosed with a central retinal vein occlusion. Saw a  retinal specialist and has had steroid injections. He did not want to take PCSK 9 inhibitors in the past but is now willing to consider Nexlizet.   He did have left inguinal hernia repair in Oct 2022.    Past Medical History:  Diagnosis Date   Bipolar 1 disorder (HCC)    CAD (coronary artery disease)    a. STEMI 09/2012  single vessel occlusive CAD of the RCA s/p DES to mid RCA, EF 45%;  b. inferior STEMI 03/10/2015 DES to prox and mid RCA   Depression    Hyperlipidemia    LV dysfunction    a. 09/2012 Cath LV gram severe basal to midinferior wall hypokinesis, EF 45% and echo EF 55%.   PAF (paroxysmal atrial fibrillation) (HCC)    a. noted on admission 02/2015 in setting of inf stemi;   b. CHA2DS2VASc = 1.   Tobacco abuse     Past Surgical History:  Procedure Laterality Date   APPENDECTOMY     CARDIAC CATHETERIZATION N/A 03/10/2015   Procedure: Left Heart Cath and Coronary Angiography;  Surgeon: Lennette Bihari, MD;  Location: [facility] INVASIVE CV LAB;  Service: Cardiovascular;  Laterality: N/A;   CARDIAC CATHETERIZATION  03/10/2015   Procedure: Coronary Stent Intervention;  Surgeon: Lennette Bihari, MD;  Location: [facility] INVASIVE CV LAB;  Service: Cardiovascular;;   INGUINAL HERNIA REPAIR Left 05/25/2021   Procedure: OPEN LEFT INGUINAL HERNIA AND EPIGASTRIC HERNIA REPAIR;  Surgeon: Berna Bue, MD;  Location: [facility] ORS;  Service: General;  Laterality: Left;   LEFT HEART CATHETERIZATION WITH CORONARY ANGIOGRAM N/A 09/27/2012   Procedure: LEFT HEART CATHETERIZATION WITH CORONARY ANGIOGRAM;  Surgeon: Katelynn Heidler M [place], MD;  Location: [facility] CATH LAB;  Service: Cardiovascular;  Laterality: N/A;   TONSILLECTOMY       Medications: Current Outpatient Medications  Medication Sig Dispense Refill   aspirin EC 81 MG EC tablet Take 1 tablet (81 mg total) by mouth daily.     BRILINTA 60 MG TABS tablet TAKE 1 TABLET (60 MG TOTAL) BY MOUTH 2 (TWO) TIMES DAILY. SCHEDULE OFFICE VISIT FOR FUTURE REFILLS. 60 tablet 0   buPROPion (WELLBUTRIN) 75 MG tablet Take 1 tablet (75 mg total) by  mouth daily. 90 tablet 1   carvedilol (COREG) 3.125 MG tablet TAKE 1 TABLET (3.125 MG TOTAL) BY MOUTH 2 (TWO) TIMES DAILY WITH A MEAL. 180 tablet 3   ibuprofen (ADVIL) 200 MG tablet Take 200 mg by mouth daily as needed (pain).     loratadine (CLARITIN) 10 MG tablet Take 10 mg by mouth daily.     NEXLIZET 180-10 MG TABS TAKE 1 TABLET BY MOUTH EVERY DAY 90 tablet 3   nitroGLYCERIN (NITROSTAT) 0.4 MG SL tablet Take as needed for chest pain. 1 tablet under tongue every 5 mins up to 3 tablets.After 3 tablets if chest pain call 911. 25 tablet 11   Pitavastatin Calcium (LIVALO) 2 MG TABS Take 1 tablet (2 mg total) by mouth  daily. 180 tablet 1   No current facility-administered medications for this visit.    Allergies: No Known Allergies  Social History: The patient  reports that he has been smoking cigarettes. He has a 20.00 pack-year smoking history. He has never used smokeless tobacco. He reports that he does not currently use alcohol. He reports that he does not use drugs.   Family History: The patient's family history includes CAD in his father.   Review of Systems: Please see the history of present illness.    All other systems are reviewed and negative.   Physical Exam: VS:  There were no vitals taken for this visit. Marland Kitchen  BMI There is no height or weight on file to calculate BMI.  Wt Readings from Last 3 Encounters:  05/25/21 150 lb 3.2 oz (68.1 kg)  05/19/21 150 lb 3.2 oz (68.1 kg)  10/25/20 156 lb (70.8 kg)   GENERAL:  Well appearing WM in NAD HEENT:  PERRL, EOMI, sclera are clear. Oropharynx is clear. NECK:  No jugular venous distention, carotid upstroke brisk and symmetric, no bruits, no thyromegaly or adenopathy LUNGS:  Clear to auscultation bilaterally CHEST:  Unremarkable HEART:  RRR,  PMI not displaced or sustained,S1 and S2 within normal limits, no S3, no S4: no clicks, no rubs, no murmurs ABD:  Soft, nontender. BS +, no masses or bruits. No hepatomegaly, no splenomegaly EXT:  2 + pulses throughout, no edema, no cyanosis no clubbing SKIN:  Warm and dry.  No rashes NEURO:  Alert and oriented x 3. Cranial nerves II through XII intact. PSYCH:  Cognitively intact     LABORATORY DATA:  EKG:  EKG is not  ordered today    Lab Results  Component Value Date   WBC 6.6 04/25/2022   HGB 14.2 04/25/2022   HCT 42.5 04/25/2022   PLT 209 04/25/2022   GLUCOSE 79 04/25/2022   CHOL 135 04/25/2022   TRIG 89 04/25/2022   HDL 49 04/25/2022   LDLCALC 69 04/25/2022   ALT 13 04/25/2022   AST 17 04/25/2022   NA 137 04/25/2022   K 4.9 04/25/2022   CL 101 04/25/2022   CREATININE 1.35 (H)  04/25/2022   BUN 17 04/25/2022   CO2 24 04/25/2022   TSH 1.565 08/03/2015   PSA 1.30 08/03/2015   INR 1.18 09/27/2012   HGBA1C 5.7 (H) 09/27/2012    BNP (last 3 results) No results for input(s): "BNP" in the last 8760 hours.  ProBNP (last 3 results) No results for input(s): "PROBNP" in the last 8760 hours.   Other Studies Reviewed Today: Echo: 06/08/15:Study Conclusions  - Procedure narrative: Limited transthoracic echocardiography for   left ventricular function evaluation. Image quality was adequate. - Left ventricle:  The cavity size was normal. Wall thickness was   normal. Systolic function was normal. The estimated ejection   fraction was in the range of 50% to 55%. There is inferior and   inferoseptal hypokinesis.  Impressions:  - Limited echo for LV function. There is inferior and inferoseptal   hypokinesis. LVEF has improved to 50-55%.  Assessment/Plan:  1. CAD s/p inferior MI with stenting in 2014. Recurrent STEMI in July 2016 with instent restenosis/occlusion. S/p repeat DES. On DAPT. Would favor continuing this long term. Continue Brilinta 60 mg bid and ASA 81 mg daily. Continue low dose beta blocker. Unable to tolerate higher dose due to low BP.  Encourage exercise and complete smoking cessation.   2. Tobacco abuse. Encourage efforts at complete smoking cessation.    3. Ischemic cardiomyopathy in setting of acute MI. EF has recovered.    4. Hyperlipidemia. Intolerant of lipitor. Now on Livalo 2 mg and Zetia 10 mg daily. will switch Zetia to Nexlizet 180/10 mg daily. Repeat lab work in 3 months.   5.. Bipolar disorder.   Current medicines are reviewed with the patient today.  The patient does not have concerns regarding medicines other than what has been noted above.  The following changes have been made:  See above.  Labs/ tests ordered today include:    No orders of the defined types were placed in this encounter.    Disposition:   FU  in 6  months.  Signed: Keaunna Skipper [place] MD, [facility]    04/28/2022 5:24 PM

## 2022-05-02 ENCOUNTER — Encounter: Payer: Self-pay | Admitting: Cardiology

## 2022-05-02 ENCOUNTER — Ambulatory Visit: Payer: BC Managed Care – PPO | Attending: Cardiology | Admitting: Cardiology

## 2022-05-02 VITALS — BP 138/90 | HR 79 | Ht 67.0 in | Wt 155.0 lb

## 2022-05-02 DIAGNOSIS — I2510 Atherosclerotic heart disease of native coronary artery without angina pectoris: Secondary | ICD-10-CM

## 2022-05-02 DIAGNOSIS — E7800 Pure hypercholesterolemia, unspecified: Secondary | ICD-10-CM | POA: Diagnosis not present

## 2022-05-02 DIAGNOSIS — Z720 Tobacco use: Secondary | ICD-10-CM

## 2022-05-31 ENCOUNTER — Other Ambulatory Visit: Payer: Self-pay | Admitting: Cardiology

## 2022-06-03 ENCOUNTER — Other Ambulatory Visit: Payer: Self-pay | Admitting: Cardiology

## 2022-06-21 ENCOUNTER — Encounter: Payer: Self-pay | Admitting: Physician Assistant

## 2022-06-21 ENCOUNTER — Ambulatory Visit: Payer: BC Managed Care – PPO | Admitting: Physician Assistant

## 2022-06-21 ENCOUNTER — Other Ambulatory Visit: Payer: Self-pay | Admitting: Physician Assistant

## 2022-06-21 DIAGNOSIS — F17200 Nicotine dependence, unspecified, uncomplicated: Secondary | ICD-10-CM | POA: Diagnosis not present

## 2022-06-21 DIAGNOSIS — F411 Generalized anxiety disorder: Secondary | ICD-10-CM

## 2022-06-21 DIAGNOSIS — F3342 Major depressive disorder, recurrent, in full remission: Secondary | ICD-10-CM

## 2022-06-21 MED ORDER — BUPROPION HCL 75 MG PO TABS: 75.0000 mg | ORAL_TABLET | Freq: Every day | ORAL | 1 refills | Status: DC

## 2022-06-21 NOTE — Progress Notes (Addendum)
[facility]  Patient ID: Jason Maldonado,  MRN: [id number]  PCP: Patient, No Pcp Per  Date of Evaluation: 06/21/2022 Time spent:20 minutes  Chief Complaint:  Chief Complaint   Depression; Follow-up    HISTORY/CURRENT STATUS: For routine med check.   Doing well with the Wellbutrin. Patient is able to enjoy things.  Energy and motivation are good.  Work is going well, still enjoys his job.  No extreme sadness, tearfulness, or feelings of hopelessness.  Sleeps well most of the time. ADLs and personal hygiene are normal.  Denies any changes in concentration, making decisions, or remembering things.  Appetite has not changed.  Weight is stable. No c/o anxiety.  Denies suicidal or homicidal thoughts.  Patient denies increased energy with decreased need for sleep, increased talkativeness, racing thoughts, impulsivity or risky behaviors, increased spending, increased libido, grandiosity, increased irritability or anger, paranoia, or hallucinations.  Denies dizziness, syncope, seizures, numbness, tingling, tremor, tics, unsteady gait, slurred speech, confusion. Denies muscle or joint pain, stiffness, or dystonia.  Individual Medical History/ Review of Systems: Changes? :Yes   sinus probs and allergies right now.  Past medications for mental health diagnoses include: Wellbutrin, Xanax, Cymbalta, symbyax, Seroquel, Latuda, Lamictal, Depakote, Risperdal, Prozac, Effexor, Abilify  Allergies: Patient has no known allergies.  Current Medications:  Current Outpatient Medications:    aspirin EC 81 MG EC tablet, Take 1 tablet (81 mg total) by mouth daily., Disp: , Rfl:    carvedilol (COREG) 3.125 MG tablet, TAKE 1 TABLET TWICE A DAY WITH A MEAL, Disp: 180 tablet, Rfl: 3   ibuprofen (ADVIL) 200 MG tablet, Take 200 mg by mouth daily as needed (pain)., Disp: , Rfl:    LIVALO 2 MG TABS, TAKE 1 TABLET BY MOUTH EVERY DAY, Disp: 90 tablet, Rfl: 3   loratadine (CLARITIN) 10 MG tablet, Take 10  mg by mouth daily., Disp: , Rfl:    NEXLIZET 180-10 MG TABS, TAKE 1 TABLET BY MOUTH EVERY DAY, Disp: 90 tablet, Rfl: 3   nitroGLYCERIN (NITROSTAT) 0.4 MG SL tablet, Take as needed for chest pain. 1 tablet under tongue every 5 mins up to 3 tablets.After 3 tablets if chest pain call 911., Disp: 25 tablet, Rfl: 11   ticagrelor (BRILINTA) 60 MG TABS tablet, Take 1 tablet (60 mg total) by mouth 2 (two) times daily., Disp: 90 tablet, Rfl: 3   buPROPion (WELLBUTRIN) 75 MG tablet, Take 1 tablet (75 mg total) by mouth daily., Disp: 90 tablet, Rfl: 1 Medication Side Effects: none  Family Medical/ Social History: Changes? No  MENTAL HEALTH EXAM:  There were no vitals taken for this visit.There is no height or weight on file to calculate BMI.  General Appearance: Casual, Neat and Well Groomed  Eye Contact:  Good  Speech:  Clear and Coherent and Normal Rate  Volume:  Normal  Mood:  Euthymic  Affect:  Appropriate  Thought Process:  Goal Directed and Descriptions of Associations: Circumstantial  Orientation:  Full (Time, Place, and Person)  Thought Content: Logical   Suicidal Thoughts:  No  Homicidal Thoughts:  No  Memory:  WNL  Judgement:  Good  Insight:  Good  Psychomotor Activity:  Normal  Concentration:  Concentration: Good and Attention Span: Good  Recall:  Good  Fund of Knowledge: Good  Language: Good  Assets:  Desire for Improvement Financial Resources/Insurance Housing Transportation Vocational/Educational  ADL's:  Intact  Cognition: WNL  Prognosis:  Good   DIAGNOSES:    ICD-10-CM   1. Recurrent  major depressive disorder, in full remission (HCC)  F33.42     2. Generalized anxiety disorder  F41.1     3. Smoker  F17.200       Receiving Psychotherapy: No   RECOMMENDATIONS:  PDMP reviewed. Oxycodone Rx 05/25/2021. I provided 20 minutes of face to face time during this encounter, including time spent before and after the visit in records review, medical decision making,  counseling pertinent to today's visit, and charting.   Doing well so no change needed.  Discussed smoking cessation. He's not ready to quit.  Continue Wellbutrin 75 mg, 1 p.o. daily. Return in 6 months.   Melony Overly, PA-C

## 2022-09-25 ENCOUNTER — Ambulatory Visit: Payer: BC Managed Care – PPO | Attending: Nurse Practitioner | Admitting: Nurse Practitioner

## 2022-09-25 ENCOUNTER — Telehealth: Payer: Self-pay | Admitting: Cardiology

## 2022-09-25 ENCOUNTER — Encounter: Payer: Self-pay | Admitting: Nurse Practitioner

## 2022-09-25 ENCOUNTER — Ambulatory Visit (INDEPENDENT_AMBULATORY_CARE_PROVIDER_SITE_OTHER): Payer: BC Managed Care – PPO

## 2022-09-25 ENCOUNTER — Telehealth: Payer: Self-pay

## 2022-09-25 VITALS — BP 138/82 | HR 83 | Ht 67.0 in | Wt 147.6 lb

## 2022-09-25 DIAGNOSIS — I519 Heart disease, unspecified: Secondary | ICD-10-CM | POA: Diagnosis not present

## 2022-09-25 DIAGNOSIS — E785 Hyperlipidemia, unspecified: Secondary | ICD-10-CM | POA: Diagnosis not present

## 2022-09-25 DIAGNOSIS — R002 Palpitations: Secondary | ICD-10-CM

## 2022-09-25 DIAGNOSIS — I480 Paroxysmal atrial fibrillation: Secondary | ICD-10-CM

## 2022-09-25 DIAGNOSIS — I2510 Atherosclerotic heart disease of native coronary artery without angina pectoris: Secondary | ICD-10-CM | POA: Diagnosis not present

## 2022-09-25 DIAGNOSIS — F411 Generalized anxiety disorder: Secondary | ICD-10-CM

## 2022-09-25 DIAGNOSIS — F331 Major depressive disorder, recurrent, moderate: Secondary | ICD-10-CM

## 2022-09-25 DIAGNOSIS — Z720 Tobacco use: Secondary | ICD-10-CM

## 2022-09-25 LAB — CBC

## 2022-09-25 MED ORDER — NITROGLYCERIN 0.4 MG SL SUBL: SUBLINGUAL_TABLET | SUBLINGUAL | 11 refills | Status: AC

## 2022-09-25 NOTE — Telephone Encounter (Signed)
Patient state he had 2 episodes of racing heart beat with presyncope and nausea. Denies chest/jaw/arm pain. He stated he received "bad news" over the weekend and is not sure if that wea the cause oe elevated P. Dr. Martinique adivsed and wanted pt seen in clinic today for EKG and possibel heart monitor

## 2022-09-25 NOTE — Patient Instructions (Signed)
Medication Instructions:  Your physician recommends that you continue on your current medications as directed. Please refer to the Current Medication list given to you today.   *If you need a refill on your cardiac medications before your next appointment, please call your pharmacy*   Lab Work: Your physician recommends that you complete lab work today CBC, BMET, TSH, Magnesium   If you have labs (blood work) drawn today and your tests are completely normal, you will receive your results only by: [place] (if you have MyChart) OR A paper copy in the mail If you have any lab test that is abnormal or we need to change your treatment, we will call you to review the results.   Testing/Procedures: ZIO AT Long term monitor-Live Telemetry  Your physician has requested you wear a ZIO patch monitor for 14 days.  This is a single patch monitor. [employer] supplies one patch monitor per enrollment. Additional  stickers are not available.  Please do not apply patch if you will be having a Nuclear Stress Test, Echocardiogram, Cardiac CT, MRI,  or Chest Xray during the period you would be wearing the monitor. The patch cannot be worn during  these tests. You cannot remove and re-apply the ZIO AT patch monitor.  Your ZIO patch monitor will be mailed 3 day USPS to your address on file. It may take 3-5 days to  receive your monitor after you have been enrolled.  Once you have received your monitor, please review the enclosed instructions. Your monitor has  already been registered assigning a specific monitor serial # to you.   Billing and Patient Assistance Program information  Jason Maldonado has been supplied with any insurance information on record for billing. [employer] offers a sliding scale Patient Assistance Program for patients without insurance, or whose  insurance does not completely cover the cost of the ZIO patch monitor. You must apply for the  Patient Assistance Program to qualify for the  discounted rate. To apply, call [employer] at [phone],  select option 4, select option 2 , ask to apply for the Patient Assistance Program, (you can request an  interpreter if needed). [employer] will ask your household income and how many people are in your  household. [employer] will quote your out-of-pocket cost based on this information. They will also be able  to set up a 12 month interest free payment plan if needed.  Applying the monitor   Shave hair from upper left chest.  Hold the abrader disc by orange tab. Rub the abrader in 40 strokes over left upper chest as indicated in  your monitor instructions.  Clean area with 4 enclosed alcohol pads. Use all pads to ensure the area is cleaned thoroughly. Let  dry.  Apply patch as indicated in monitor instructions. Patch will be placed under collarbone on left side of  chest with arrow pointing upward.  Rub patch adhesive wings for 2 minutes. Remove the white label marked "1". Remove the white label  marked "2". Rub patch adhesive wings for 2 additional minutes.  While looking in a mirror, press and release button in center of patch. A small green light will flash 3-4  times. This will be your only indicator that the monitor has been turned on.  Do not shower for the first 24 hours. You may shower after the first 24 hours.  Press the button if you feel a symptom. You will hear a small click. Record Date, Time and Symptom in  the Patient Log.  Starting the Gateway  In your kit there is a Hydrographic surveyor box the size of a cellphone. This is Airline pilot. It transmits all your  recorded data to [facility]. This box must always stay within 10 feet of you. Open the box and push the *  button. There will be a light that blinks orange and then green a few times. When the light stops  blinking, the Gateway is connected to the ZIO patch. Call [employer] at [phone] to confirm your monitor is transmitting.  Returning your monitor  Remove your patch  and place it inside the [place]. In the lower half of the Gateway there is a white  bag with prepaid postage on it. Place Gateway in bag and seal. Mail package back to [place] as soon as  possible. Your physician should have your final report approximately 7 days after you have mailed back  your monitor. Call [place] at [phone] if you have questions regarding your ZIO AT  patch monitor. Call them immediately if you see an orange light blinking on your monitor.  If your monitor falls off in less than 4 days, contact our Monitor department at [phone]. If your  monitor becomes loose or falls off after 4 days call [employer] at [phone] for suggestions on  securing your monitor      Follow-Up: At [facility], you and your health needs are our priority.  As part of our continuing mission to provide you with exceptional heart care, we have created designated Provider Care Teams.  These Care Teams include your primary Cardiologist (physician) and Advanced Practice Providers (APPs -  Physician Assistants and Nurse Practitioners) who all work together to provide you with the care you need, when you need it.  We recommend signing up for the patient portal called "MyChart".  Sign up information is provided on this After Visit Summary.  MyChart is used to connect with patients for Virtual Visits (Telemedicine).  Patients are able to view lab/test results, encounter notes, upcoming appointments, etc.  Non-urgent messages can be sent to your provider as well.   To learn more about what you can do with MyChart, go to [email or web address].    Your next appointment:   6 week(s)  Provider:   Diona Browner, NP        Other Instructions

## 2022-09-25 NOTE — Telephone Encounter (Signed)
OPENED IN ERROR

## 2022-09-25 NOTE — Progress Notes (Signed)
Office Visit    Patient Name: Jason Maldonado Date of Encounter: 09/25/2022  Primary Care Provider:  Patient, No Pcp Per Primary Cardiologist:  Peter Martinique, MD  Chief Complaint    67 year old male with a history of CAD s/p DES-RCA in 2014, DES-p-mRCA in 2016 (ISR), LV dysfunction, paroxysmal atrial fibrillation, hyperlipidemia, bipolar disorder and tobacco use who presents for follow-up related to CAD and palpitations/atrial fibrillation.  Past Medical History    Past Medical History:  Diagnosis Date   Bipolar 1 disorder ([place])    CAD (coronary artery disease)    a. STEMI 09/2012  single vessel occlusive CAD of the RCA s/p DES to mid RCA, EF 45%;  b. inferior STEMI 03/10/2015 DES to prox and mid RCA   Depression    Hyperlipidemia    LV dysfunction    a. 09/2012 Cath LV gram severe basal to midinferior wall hypokinesis, EF 45% and echo EF 55%.   PAF (paroxysmal atrial fibrillation) ([place])    a. noted on admission 02/2015 in setting of inf stemi;  b. CHA2DS2VASc = 1.   Tobacco abuse    Past Surgical History:  Procedure Laterality Date   APPENDECTOMY     CARDIAC CATHETERIZATION N/A 03/10/2015   Procedure: Left Heart Cath and Coronary Angiography;  Surgeon: Troy Sine, MD;  Location: [place] [facility];  Service: Cardiovascular;  Laterality: N/A;   CARDIAC CATHETERIZATION  03/10/2015   Procedure: Coronary Stent Intervention;  Surgeon: Troy Sine, MD;  Location: [facility] [facility];  Service: Cardiovascular;;   INGUINAL HERNIA REPAIR Left 05/25/2021   Procedure: OPEN LEFT INGUINAL HERNIA AND EPIGASTRIC HERNIA REPAIR;  Surgeon: Clovis Riley, MD;  Location: [facility];  Service: General;  Laterality: Left;   LEFT HEART CATHETERIZATION WITH CORONARY ANGIOGRAM N/A 09/27/2012   Procedure: LEFT HEART CATHETERIZATION WITH CORONARY ANGIOGRAM;  Surgeon: Peter M Martinique, MD;  Location: [facility] C[facility] LAB;  Service: Cardiovascular;  Laterality: N/A;   TONSILLECTOMY      Allergies  No Known  Allergies   Labs/Other Studies Reviewed    The following studies were reviewed today: Cath 02/2015: Prox RCA to Mid RCA lesion, 100% stenosed. There is a 0% residual stenosis post intervention. The lesion was previously treated with a stent (unknown type) . A drug-eluting stent was placed. There is mild left ventricular systolic dysfunction. RPDA lesion, 50% stenosed. 1st RPLB lesion, 40% stenosed. Dist RCA lesion, 40% stenosed.   High risk acute inferior ST segment elevation myocardial infarction secondary to total proximal occlusion of the RCA immediately proximal to the previously placed stent with TIMI 0 flow in a very large dominant RCA.   Normal left coronary system with a normal LAD and circumflex vessel.   Difficult but successful PCI to the RCA with PTCA, ultimate insertion of a 4.038 mm Resolute DES stent postdilated to 4.41 mm proximally and 4.1 mm distally with restoration of TIMI-3 flow; 40% narrowing beyond the stented segment prior to the acute margin, 50% stenosis in the proximal PDA and 40% stenosis in the distal RCA continuation branch.   RECOMMENDATION:   Recommend DAPT indefinitely and after one year consider changing  brilinta to 60 mg twice a day per [place] trial data.  Smoking cessation is imperative.  Aggressive lipid-lowering therapy and treatment with beta blocker, ACE inhibitor and nitrate therapy.  Limited Echo 05/2015:  Study Conclusions  - Procedure narrative: Limited transthoracic echocardiography for   left ventricular function evaluation. Image quality was adequate. - Left  ventricle: The cavity size was normal. Wall thickness was   normal. Systolic function was normal. The estimated ejection   fraction was in the range of 50% to 55%. There is inferior and   inferoseptal hypokinesis.  Impressions:  - Limited echo for LV function. There is inferior and inferoseptal   hypokinesis. LVEF has improved to 50-55%.   Recent Labs: 04/25/2022: ALT 13;  BUN 17; Creatinine, Ser 1.35; Hemoglobin 14.2; Platelets 209; Potassium 4.9; Sodium 137  Recent Lipid Panel    Component Value Date/Time   CHOL 135 04/25/2022 0849   TRIG 89 04/25/2022 0849   HDL 49 04/25/2022 0849   CHOLHDL 2.8 04/25/2022 0849   CHOLHDL 3.5 11/20/2018 0804   VLDL 18 03/14/2017 0906   LDLCALC 69 04/25/2022 0849   LDLCALC 95 11/20/2018 0804    History of Present Illness    67 year old male with the above past medical history including CAD s/p DES-RCA in 2014, DES-p-mRCA in 2016 (ISR), LV dysfunction, paroxysmal atrial fibrillation, hyperlipidemia, bipolar disorder and tobacco use.  He has a history of prior inferior MI in 2014 with PCI/DES-RCA.  He was hospitalized in 02/2015 in the setting of inferior STEMI in the setting of RCA ISR.  Cardiac catheterization revealed 100% stenosis of the proximal mid RCA proximal to previously placed stent, s/p DES.  He was noted to have 50% residual RPDA, 40% first RPL B, and 40% distal RCA stenoses.  He was started on aspirin and Brilinta.  Echo showed EF 30 to 35%, basal to mid inferior akinesis, inferoseptal and inferolateral severe hypokinesis.  Repeat echo in 05/2015 showed inferior hypokinesis with EF improved to 50 to 55%.  He had reported brief atrial fibrillation upon presentation with his acute MI, ever, there is no documented evidence of this. He has declined PCSK9 inhibitors in the past, and is tolerating Livalo and Nexlizet.  He was last seen in the office on 05/02/2022 and was stable from a cardiac standpoint.  He denied symptoms concerning for angina.  He continued to smoke at the time.  He contacted our office on 09/25/2022 with concern for a 1 month history of a "sinking feeling in his chest," with associated nausea, palpitations, and lightheadedness.  He presents today for follow-up.  Since his last visit he has been stable overall from a cardiac standpoint.  Over the past month he has noted intermittent palpitations which he  describes as his heart racing accompanied by a "sinking feeling" in his chest, lightheadedness, and mild nausea.  He notes that his symptoms have been primarily associated with stress and anxiety and occur 2-3 times a week and generally last for less than 5 minutes at a time.  He received stressful/bad news this past Saturday and had an episode that lasted for approximately 15 minutes.  He denies any symptoms similar to prior anginal equivalent.  He is able to tolerate activity without symptoms.  He denies any syncope, dyspnea, edema, PND, orthopnea, weight gain.  He continues to smoke approximately 10 cigarettes a day.  Other than his recent palpitations, he reports feeling well.  Home Medications    Current Outpatient Medications  Medication Sig Dispense Refill   aspirin EC 81 MG EC tablet Take 1 tablet (81 mg total) by mouth daily.     buPROPion (WELLBUTRIN) 75 MG tablet Take 1 tablet (75 mg total) by mouth daily. 90 tablet 1   carvedilol (COREG) 3.125 MG tablet TAKE 1 TABLET TWICE A DAY WITH A MEAL 180 tablet 3  ibuprofen (ADVIL) 200 MG tablet Take 200 mg by mouth daily as needed (pain).     LIVALO 2 MG TABS TAKE 1 TABLET BY MOUTH EVERY DAY 90 tablet 3   loratadine (CLARITIN) 10 MG tablet Take 10 mg by mouth daily.     NEXLIZET 180-10 MG TABS TAKE 1 TABLET BY MOUTH EVERY DAY 90 tablet 3   ticagrelor (BRILINTA) 60 MG TABS tablet Take 1 tablet (60 mg total) by mouth 2 (two) times daily. 90 tablet 3   nitroGLYCERIN (NITROSTAT) 0.4 MG SL tablet Take as needed for chest pain. 1 tablet under tongue every 5 mins up to 3 tablets.After 3 tablets if chest pain call 911. 25 tablet 11   No current facility-administered medications for this visit.     Review of Systems   He denies chest pain, dyspnea, pnd, orthopnea, n, v, dizziness, syncope, edema, weight gain, or early satiety. All other systems reviewed and are otherwise negative except as noted above.   Physical Exam    VS:  BP 138/82   Pulse  83   Ht 5' 7"$  (1.702 m)   Wt 147 lb 9.6 oz (67 kg)   SpO2 99%   BMI 23.12 kg/m   GEN: Well nourished, well developed, in no acute distress. HEENT: normal. Neck: Supple, no JVD, carotid bruits, or masses. Cardiac: RRR, no murmurs, rubs, or gallops. No clubbing, cyanosis, edema.  Radials/DP/PT 2+ and equal bilaterally.  Respiratory:  Respirations regular and unlabored, clear to auscultation bilaterally. GI: Soft, nontender, nondistended, BS + x 4. MS: no deformity or atrophy. Skin: warm and dry, no rash. Neuro:  Strength and sensation are intact. Psych: Normal affect.  Accessory Clinical Findings   ECG personally reviewed by me today -sinus rhythm, 83 bpm, sinus arrhythmia, PVCs, evidence of prior inferior infarct, T wave abnormality- no acute changes.   Lab Results  Component Value Date   WBC 6.6 04/25/2022   HGB 14.2 04/25/2022   HCT 42.5 04/25/2022   MCV 82 04/25/2022   PLT 209 04/25/2022   Lab Results  Component Value Date   CREATININE 1.35 (H) 04/25/2022   BUN 17 04/25/2022   NA 137 04/25/2022   K 4.9 04/25/2022   CL 101 04/25/2022   CO2 24 04/25/2022   Lab Results  Component Value Date   ALT 13 04/25/2022   AST 17 04/25/2022   ALKPHOS 39 (L) 04/25/2022   BILITOT 0.4 04/25/2022   Lab Results  Component Value Date   CHOL 135 04/25/2022   HDL 49 04/25/2022   LDLCALC 69 04/25/2022   TRIG 89 04/25/2022   CHOLHDL 2.8 04/25/2022    Lab Results  Component Value Date   HGBA1C 5.7 (H) 09/27/2012    Assessment & Plan   1. Paroxysmal atrial fibrillation/palpitations: Reported history atrial fibrillation at the time of his MI in 2016, though there is no EKG documentation of this.  He notes a 1 month history of intermittent palpitations which he describes as his heart racing with an associated "sinking feeling" in his chest, lightheadedness, and nausea.  Denies syncope.  Denies chest pain, dyspnea.  EKG today shows sinus rhythm with PVCs.  Will check 14-day ZIO, will  update labs including CBC, BMET, TSH, and Mg.  Reviewed ED precautions.  Continue carvedilol.  2. CAD:  S/p DES-RCA in 2014, DES-p-mRCA in 2016 (ISR). Stable with no anginal symptoms. No indication for ischemic evaluation.  Continue aspirin, Brilinta, carvedilol, Livalo, and Nexlizet.   3. LV dysfunction: Prior  EF 30 to 35%, improved to 50 to 55%. Euvolemic and well compensated on exam.  Continue current medications as above.  4. Hyperlipidemia: LDL was 69 in 04/2022.  He received a letter from his insurance company stating that Livalo was no longer covered and would require prior authorization.  Will forward this information to our prior authorization team for review.  5. Tobacco use: Full cessation advised.   6. Anxiety/depression: Patient has a history of depression and anxiety.  He believes his anxiety is currently not well-controlled, likely contributing to his symptoms as above.  He does not have a PCP.  Provided list of PCPs in the area today for follow-up.   7. Disposition: Follow-up in 6 weeks.     Lenna Sciara, NP 09/25/2022, 12:20 PM

## 2022-09-25 NOTE — Telephone Encounter (Deleted)
Pt was seen in office today by Diona Browner NP. Pt needs a Prior Auth on medication on Livalo 2 mg . This PA needs to be completed before November 13, 2022 as pts  benefits will change per [facility].Routing message to the [place].

## 2022-09-25 NOTE — Telephone Encounter (Signed)
Patient c/o Palpitations:  High priority if patient c/o lightheadedness, shortness of breath, or chest pain  How long have you had palpitations/irregular HR/ Afib? Are you having the symptoms now? About a month, is feeling a sinking feeling chest  Are you currently experiencing lightheadedness, SOB or CP? No   Do you have a history of afib (atrial fibrillation) or irregular heart rhythm? Yes   Have you checked your BP or HR? (document readings if available): No   Are you experiencing any other symptoms? Nausea, sinking feeling in chest, elevated HR, and lightheadedness when episodes occur    Reports the last episode that caused concern occurred Saturday 02/10. He states he received bad news that he believes brought on the symptoms of feeling his heart was going to beat out of his chest, a sinking feeling, lightheadedness to the point he thought he was going to faint, and nausea. Patient c/o feeling these symptoms start again this morning at work and told his job he needed to go home and call our office or go to the ED. Patient reports the symptoms have mostly passed now besides the sinking feeling in his chest. Please advise.

## 2022-09-25 NOTE — Progress Notes (Unsigned)
Enrolled for [employer] to mail a ZIO XT long term holter monitor to the patients address on file.   Dr. Martinique to read.

## 2022-09-26 LAB — BASIC METABOLIC PANEL
BUN/Creatinine Ratio: 18 (ref 10–24)
BUN: 25 mg/dL (ref 8–27)
CO2: 23 mmol/L (ref 20–29)
Calcium: 10 mg/dL (ref 8.6–10.2)
Chloride: 102 mmol/L (ref 96–106)
Creatinine, Ser: 1.37 mg/dL — ABNORMAL HIGH (ref 0.76–1.27)
Glucose: 91 mg/dL (ref 70–99)
Potassium: 4.9 mmol/L (ref 3.5–5.2)
Sodium: 139 mmol/L (ref 134–144)
eGFR: 56 mL/min/{1.73_m2} — ABNORMAL LOW (ref >59)

## 2022-09-26 LAB — CBC
Hematocrit: 45.6 % (ref 37.5–51.0)
Hemoglobin: 14.8 g/dL (ref 13.0–17.7)
MCH: 26.8 pg (ref 26.6–33.0)
MCHC: 32.5 g/dL (ref 31.5–35.7)
MCV: 83 fL (ref 79–97)
Platelets: 292 10*3/uL (ref 150–450)
RBC: 5.53 x10E6/uL (ref 4.14–5.80)
RDW: 13.3 % (ref 11.6–15.4)
WBC: 8.1 10*3/uL (ref 3.4–10.8)

## 2022-09-26 LAB — TSH: TSH: 1.69 u[IU]/mL (ref 0.450–4.500)

## 2022-09-26 LAB — MAGNESIUM: Magnesium: 2 mg/dL (ref 1.6–2.3)

## 2022-09-30 DIAGNOSIS — R002 Palpitations: Secondary | ICD-10-CM | POA: Diagnosis not present

## 2022-10-03 ENCOUNTER — Telehealth: Payer: Self-pay

## 2022-10-03 NOTE — Telephone Encounter (Signed)
Attempted to call pt to discuss lab results. Phone continued to ring. Not able to leave a VM. Will call pt back.

## 2022-10-13 ENCOUNTER — Telehealth: Payer: Self-pay

## 2022-10-13 NOTE — Telephone Encounter (Signed)
Spoke with pt. Pt was notified of lab results. Pt will continue his current medication and follow up as planned.

## 2022-10-30 ENCOUNTER — Telehealth: Payer: Self-pay | Admitting: Cardiology

## 2022-10-30 ENCOUNTER — Other Ambulatory Visit: Payer: Self-pay

## 2022-10-30 DIAGNOSIS — R002 Palpitations: Secondary | ICD-10-CM | POA: Diagnosis not present

## 2022-10-30 DIAGNOSIS — I472 Ventricular tachycardia, unspecified: Secondary | ICD-10-CM

## 2022-10-30 NOTE — Telephone Encounter (Signed)
Received call from [facility] reporting critical EKG on patient:  Episode of Vtach 178bpm for 30 secs on 2/22 at 143 pm   Final report uploaded in [place].   NP made aware.

## 2022-10-30 NOTE — Telephone Encounter (Signed)
Spoke with pt. Pt is aware of urgent EP referral that was placed today. Please arrange appointment for possible VT.

## 2022-10-30 NOTE — Telephone Encounter (Signed)
Zio by [employer] is calling with abnormal patch results. Reference Number: [id number]   Zio hung up while trying to get triage on the phone.

## 2022-10-30 NOTE — Telephone Encounter (Signed)
EP urgent refferal placed. Lmom to discuss with pt. Waiting on a return call to let pt know that a referral was placed and needed at this time.

## 2022-11-06 ENCOUNTER — Ambulatory Visit: Payer: BC Managed Care – PPO | Admitting: Nurse Practitioner

## 2022-11-28 ENCOUNTER — Other Ambulatory Visit: Payer: Self-pay | Admitting: Cardiology

## 2022-11-30 ENCOUNTER — Other Ambulatory Visit: Payer: Self-pay | Admitting: Physician Assistant

## 2022-12-07 ENCOUNTER — Other Ambulatory Visit: Payer: Self-pay

## 2022-12-07 ENCOUNTER — Encounter: Payer: Self-pay | Admitting: Cardiovascular Disease

## 2022-12-07 ENCOUNTER — Ambulatory Visit: Payer: BC Managed Care – PPO | Attending: Cardiovascular Disease | Admitting: Cardiovascular Disease

## 2022-12-07 VITALS — BP 170/96 | HR 83 | Wt 151.7 lb

## 2022-12-07 DIAGNOSIS — R000 Tachycardia, unspecified: Secondary | ICD-10-CM

## 2022-12-07 MED ORDER — CARVEDILOL 6.25 MG PO TABS: 6.2500 mg | ORAL_TABLET | Freq: Two times a day (BID) | ORAL | 3 refills | Status: DC

## 2022-12-07 NOTE — Patient Instructions (Addendum)
Medication Instructions:  Your physician has recommended you make the following change in your medication:  1) INCREASE carvedilol to 6.25 mg twice daily  *If you need a refill on your cardiac medications before your next appointment, please call your pharmacy*  Lab Work: BMET and CBC at [facility] prior to your ablation If you have labs (blood work) drawn today and your tests are completely normal, you will receive your results only by: MyChart Message (if you have MyChart) OR A paper copy in the mail If you have any lab test that is abnormal or we need to change your treatment, we will call you to review the results.   Testing/Procedures: Your physician has requested that you have an echocardiogram. Echocardiography is a painless test that uses sound waves to create images of your heart. It provides your doctor with information about the size and shape of your heart and how well your heart's chambers and valves are working. This procedure takes approximately one hour. There are no restrictions for this procedure. Please do NOT wear cologne, perfume, aftershave, or lotions (deodorant is allowed). Please arrive 15 minutes prior to your appointment time.  Your physician has recommended that you have an ablation. Catheter ablation is a medical procedure used to treat some cardiac arrhythmias (irregular heartbeats). During catheter ablation, a long, thin, flexible tube is put into a blood vessel in your groin (upper thigh), or neck. This tube is called an ablation catheter. It is then guided to your heart through the blood vessel. Radio frequency waves destroy small areas of heart tissue where abnormal heartbeats may cause an arrhythmia to start. Please see the instruction sheet given to you today.  Follow-Up: At [facility], you and your health needs are our priority.  As part of our continuing mission to provide you with exceptional heart care, we have created designated Provider Care  Teams.  These Care Teams include your primary Cardiologist (physician) and Advanced Practice Providers (APPs -  Physician Assistants and Nurse Practitioners) who all work together to provide you with the care you need, when you need it.  Your next appointment:   1 month after your ablation with Dr. Nelly Laurence. We will call you to schedule.

## 2022-12-07 NOTE — Progress Notes (Signed)
Electrophysiology Office Note:    Date:  12/07/2022   ID:  [name], Maldonado 12/14/1955, MRN [id number]  PCP:  Patient, No Pcp Per   [facility] Providers Cardiologist:  Peter Swaziland, MD Electrophysiologist:  Maurice Small, MD     Referring MD: Joylene Grapes, NP   History of Present Illness:    Jason Maldonado is a 67 y.o. male with a hx listed below, significant for CAD s/p inferior MI, PAF, hyperlipidemia referred for arrhythmia management.  He contacted our office in February complaining of a "sinking feeling", dizziness, lightheadedness associated with palpitations that began in January.  A 14-day monitor was placed that showed episodes of wide-complex tachycardia, some quite long.  He has not had syncope, pre-syncope, chest pain.    Past Medical History:  Diagnosis Date   Bipolar 1 disorder    CAD (coronary artery disease)    a. STEMI 09/2012  single vessel occlusive CAD of the RCA s/p DES to mid RCA, EF 45%;  b. inferior STEMI 03/10/2015 DES to prox and mid RCA   Depression    Hyperlipidemia    LV dysfunction    a. 09/2012 Cath LV gram severe basal to midinferior wall hypokinesis, EF 45% and echo EF 55%.   PAF (paroxysmal atrial fibrillation)    a. noted on admission 02/2015 in setting of inf stemi;  b. CHA2DS2VASc = 1.   Tobacco abuse     Past Surgical History:  Procedure Laterality Date   APPENDECTOMY     CARDIAC CATHETERIZATION N/A 03/10/2015   Procedure: Left Heart Cath and Coronary Angiography;  Surgeon: Lennette Bihari, MD;  Location: [facility] INVASIVE CV LAB;  Service: Cardiovascular;  Laterality: N/A;   CARDIAC CATHETERIZATION  03/10/2015   Procedure: Coronary Stent Intervention;  Surgeon: Lennette Bihari, MD;  Location: [facility] INVASIVE CV LAB;  Service: Cardiovascular;;   INGUINAL HERNIA REPAIR Left 05/25/2021   Procedure: OPEN LEFT INGUINAL HERNIA AND EPIGASTRIC HERNIA REPAIR;  Surgeon: Berna Bue, MD;  Location: [facility];  Service: General;   Laterality: Left;   LEFT HEART CATHETERIZATION WITH CORONARY ANGIOGRAM N/A 09/27/2012   Procedure: LEFT HEART CATHETERIZATION WITH CORONARY ANGIOGRAM;  Surgeon: [name], MD;  Location: [facility] CATH LAB;  Service: Cardiovascular;  Laterality: N/A;   TONSILLECTOMY      Current Medications: Current Meds  Medication Sig   aspirin EC 81 MG EC tablet Take 1 tablet (81 mg total) by mouth daily.   buPROPion (WELLBUTRIN) 75 MG tablet TAKE 1 TABLET BY MOUTH EVERY DAY   carvedilol (COREG) 3.125 MG tablet TAKE 1 TABLET TWICE A DAY WITH A MEAL   ibuprofen (ADVIL) 200 MG tablet Take 200 mg by mouth daily as needed (pain).   LIVALO 2 MG TABS TAKE 1 TABLET BY MOUTH EVERY DAY   loratadine (CLARITIN) 10 MG tablet Take 10 mg by mouth daily.   NEXLIZET 180-10 MG TABS TAKE 1 TABLET BY MOUTH EVERY DAY   nitroGLYCERIN (NITROSTAT) 0.4 MG SL tablet Take as needed for chest pain. 1 tablet under tongue every 5 mins up to 3 tablets.After 3 tablets if chest pain call 911.   ticagrelor (BRILINTA) 60 MG TABS tablet Take 1 tablet (60 mg total) by mouth 2 (two) times daily.     Allergies:   Patient has no known allergies.   Social and Family History: Reviewed in Epic  ROS:   Please see the history of present illness.    All other systems  reviewed and are negative.  EKGs/Labs/Other Studies Reviewed Today:    Echocardiogram:  TTE 06/07/2025 EF 50-55%, inferior and inferoseptal hypokinesis   Monitors:  Zio Patch XT - 14 days 09/2022 My interpretation Sins rhythm HR 47-111, avg 76 Multiple runs of tachycardia with QRS wider than that of sinus rhythm PVC burden is 8%  Stress testing:    Advanced imaging:    EKG:  Last EKG results: today -- sinus rhythm with frequent pleiomorphic PVCs   Recent Labs: 04/25/2022: ALT 13 09/25/2022: BUN 25; Creatinine, Ser 1.37; Hemoglobin 14.8; Magnesium 2.0; Platelets 292; Potassium 4.9; Sodium 139; TSH 1.690     Physical Exam:    VS:  BP (!) 170/96   Pulse 83    Wt 151 lb 11.2 oz (68.8 kg)   SpO2 98%   BMI 23.76 kg/m     Wt Readings from Last 3 Encounters:  12/07/22 151 lb 11.2 oz (68.8 kg)  09/25/22 147 lb 9.6 oz (67 kg)  05/02/22 155 lb (70.3 kg)     GEN:  Well nourished, well developed in no acute distress CARDIAC: RRR, no murmurs, rubs, gallops RESPIRATORY:  Normal work of breathing MUSCULOSKELETAL: no edema    ASSESSMENT & PLAN:    Wide complex tachycardia Rhythm is slightly irregular, morphology is similar to sinus QRS -- possibly AF with aberrancy Repeat TTE Increase carvedilol to 6.5mg  We will plan for EP study to rule in/out VT. I described the procedure to him, the indications and risks. Risks of serious complications including death from the EP study are very low.  Frequent Pleiomorphic PVCs Increase carvedilol. He is not particularly symptomatic  Paroxysmal atrial fibrillation Per chart history. I was not able to locate ECGs  CAD  S/p inferior STEMI 03/10/2015  Prior ischemic cardiomyopathy At time of STEMI; had resolved on repeat TTEs, still with inferior WMA  Hypertension Increase carvedilol          Medication Adjustments/Labs and Tests Ordered: Current medicines are reviewed at length with the patient today.  Concerns regarding medicines are outlined above.  Orders Placed This Encounter  Procedures   EKG 12-Lead   No orders of the defined types were placed in this encounter.    Signed, Maurice Small, MD  12/07/2022 4:27 PM    [facility]

## 2022-12-07 NOTE — Addendum Note (Signed)
Addended by: Frutoso Schatz on: 12/07/2022 04:38 PM   Modules accepted: Orders

## 2022-12-07 NOTE — Addendum Note (Signed)
Addended by: Frutoso Schatz on: 12/07/2022 04:42 PM   Modules accepted: Orders

## 2022-12-20 ENCOUNTER — Encounter: Payer: Self-pay | Admitting: Physician Assistant

## 2022-12-20 ENCOUNTER — Ambulatory Visit (INDEPENDENT_AMBULATORY_CARE_PROVIDER_SITE_OTHER): Payer: BC Managed Care – PPO | Admitting: Physician Assistant

## 2022-12-20 DIAGNOSIS — I519 Heart disease, unspecified: Secondary | ICD-10-CM | POA: Diagnosis not present

## 2022-12-20 DIAGNOSIS — F17200 Nicotine dependence, unspecified, uncomplicated: Secondary | ICD-10-CM

## 2022-12-20 DIAGNOSIS — F32A Depression, unspecified: Secondary | ICD-10-CM

## 2022-12-20 DIAGNOSIS — F411 Generalized anxiety disorder: Secondary | ICD-10-CM | POA: Diagnosis not present

## 2022-12-20 MED ORDER — SERTRALINE HCL 50 MG PO TABS: 50.0000 mg | ORAL_TABLET | Freq: Every day | ORAL | 1 refills | Status: DC

## 2022-12-20 NOTE — Progress Notes (Signed)
[facility]  Patient ID: Jason Maldonado,  MRN: [id number]  PCP: Patient, No Pcp Per  Date of Evaluation: 5/82024 Time spent:30 minutes  Chief Complaint:  Chief Complaint   Depression; Follow-up    HISTORY/CURRENT STATUS: For routine 17-month med check.   Has had palpitations, had event monitor, he has an upcoming echocardiogram.  He is frustrated that he has to do all this when he feels that it is all caused by anxiety.  There are triggers with his job and also with his wife.  They have been separated for years now but not divorced.  She lives in [place] with their son.  States he does not have time to enjoy anything.  Energy and motivation are fair to good, depending on the day.  No extreme sadness, tearfulness, or feelings of hopelessness.  He sometimes has trouble sleeping, has ruminating thoughts and unable to fall asleep.  It has not every night though.  ADLs and personal hygiene are normal.   Denies any changes in concentration, making decisions, or remembering things.  Appetite has not changed.  Weight is stable.  He still smokes about 1/2 pack per day.  States he has too much stress to quit.  Denies suicidal or homicidal thoughts.  Patient denies increased energy with decreased need for sleep, increased talkativeness, racing thoughts, impulsivity or risky behaviors, increased spending, increased libido, grandiosity, increased irritability or anger, paranoia, or hallucinations.  Denies dizziness, syncope, seizures, numbness, tingling, tremor, tics, unsteady gait, slurred speech, confusion. Denies muscle or joint pain, stiffness, or dystonia.  Individual Medical History/ Review of Systems: Changes? :Yes    see HPI and notes on chart.  Past medications for mental health diagnoses include: Wellbutrin, Xanax, Cymbalta, symbyax, Seroquel, Latuda, Lamictal, Depakote, Risperdal, Prozac, Effexor, Abilify  Allergies: Patient has no known allergies.  Current Medications:   Current Outpatient Medications:    aspirin EC 81 MG EC tablet, Take 1 tablet (81 mg total) by mouth daily., Disp: , Rfl:    buPROPion (WELLBUTRIN) 75 MG tablet, TAKE 1 TABLET BY MOUTH EVERY DAY, Disp: 90 tablet, Rfl: 1   carvedilol (COREG) 6.25 MG tablet, Take 1 tablet (6.25 mg total) by mouth 2 (two) times daily., Disp: 180 tablet, Rfl: 3   ibuprofen (ADVIL) 200 MG tablet, Take 200 mg by mouth daily as needed (pain)., Disp: , Rfl:    loratadine (CLARITIN) 10 MG tablet, Take 10 mg by mouth daily., Disp: , Rfl:    NEXLIZET 180-10 MG TABS, TAKE 1 TABLET BY MOUTH EVERY DAY, Disp: 90 tablet, Rfl: 3   nitroGLYCERIN (NITROSTAT) 0.4 MG SL tablet, Take as needed for chest pain. 1 tablet under tongue every 5 mins up to 3 tablets.After 3 tablets if chest pain call 911., Disp: 25 tablet, Rfl: 11   sertraline (ZOLOFT) 50 MG tablet, Take 1 tablet (50 mg total) by mouth daily., Disp: 30 tablet, Rfl: 1   ticagrelor (BRILINTA) 60 MG TABS tablet, Take 1 tablet (60 mg total) by mouth 2 (two) times daily., Disp: 90 tablet, Rfl: 3   LIVALO 2 MG TABS, TAKE 1 TABLET BY MOUTH EVERY DAY (Patient not taking: Reported on 12/20/2022), Disp: 90 tablet, Rfl: 3 Medication Side Effects: none  Family Medical/ Social History: Changes? No  MENTAL HEALTH EXAM:  There were no vitals taken for this visit.There is no height or weight on file to calculate BMI.  General Appearance: Casual, Neat and Well Groomed  Eye Contact:  Good  Speech:  Clear and Coherent  and Normal Rate  Volume:  Normal  Mood:  Anxious  Affect:  Anxious  Thought Process:  Goal Directed and Descriptions of Associations: Circumstantial  Orientation:  Full (Time, Place, and Person)  Thought Content: Logical   Suicidal Thoughts:  No  Homicidal Thoughts:  No  Memory:  WNL  Judgement:  Good  Insight:  Good  Psychomotor Activity:  Normal  Concentration:  Concentration: Good and Attention Span: Good  Recall:  Good  Fund of Knowledge: Good  Language: Good   Assets:  Desire for Improvement Financial Resources/Insurance Housing Transportation Vocational/Educational  ADL's:  Intact  Cognition: WNL  Prognosis:  Good   DIAGNOSES:    ICD-10-CM   1. Mild depression  F32.A     2. Generalized anxiety disorder  F41.1     3. Smoker unmotivated to quit  F17.200     4. Heart disease  I51.9      Receiving Psychotherapy: No   RECOMMENDATIONS:  PDMP reviewed. Oxycodone Rx 05/25/2021. I provided 30 minutes of face to face time during this encounter, including time spent before and after the visit in records review, medical decision making, and charting.   Twenty minutes of this visit were spent in counseling concerning smoking cessation.  He has already had several MIs with stent placement, quitting smoking would decrease the risk of further damage to the heart.  Discussed COPD and increased risk of bladder cancer.  Discussed Chantix, NicoDerm patches, Nicorette gum, and nicotine lozenges to help him quit.  He is not interested at this time.  He will let me know if he wants help to quit smoking in the future.  Discussed restarting Zoloft.  That will be more beneficial for the anxiety than a Wellbutrin increase, although that could help him stop smoking.  At this time I do not recommend increasing it however.  It may cause even more anxiety.  Discussed breathing exercises to help with the anxiety.  Decrease caffeine to no more than 1 coffee or soft drink daily.  If he can completely stop that would be even better.  It will help with the anxiety and palpitations as well most likely.  He agrees to restarting Zoloft.  Continue Wellbutrin 75 mg, 1 p.o. daily. Restart Zoloft 50 mg, 1 p.o. daily. Recommend counseling. Return in 4 weeks.  Melony Overly, PA-C

## 2023-01-05 ENCOUNTER — Ambulatory Visit (HOSPITAL_COMMUNITY): Payer: BC Managed Care – PPO | Attending: Cardiology

## 2023-01-05 DIAGNOSIS — I5030 Unspecified diastolic (congestive) heart failure: Secondary | ICD-10-CM

## 2023-01-05 DIAGNOSIS — R000 Tachycardia, unspecified: Secondary | ICD-10-CM | POA: Diagnosis not present

## 2023-01-06 LAB — ECHOCARDIOGRAM COMPLETE
Area-P 1/2: 3.59 cm2
S' Lateral: 3.95 cm

## 2023-01-09 ENCOUNTER — Ambulatory Visit: Payer: BC Managed Care – PPO | Admitting: Nurse Practitioner

## 2023-01-10 ENCOUNTER — Telehealth: Payer: Self-pay

## 2023-01-10 NOTE — Telephone Encounter (Signed)
-----   Message from Maurice Small, MD sent at 01/09/2023  9:08 PM EDT ----- I repeated the TTE. EF is 25-30% now. I think we need to cancel the EP study scheduled for July. He should have a life vest.    Irving Burton, can you get him in for a visit to get him back on GDMT? Between reduced EF and what we have to assume is VT, he may need a cath. I'll defer that decision.   If he's still having these palpitations and sinking feelings, we should start amiodarone.    ----- Message ----- From: Interface, [employer] Sent: 01/06/2023   1:50 PM EDT To: Maurice Small, MD

## 2023-01-10 NOTE — Telephone Encounter (Signed)
Called patient left message on personal voice mail appointment scheduled with Dr.Jordan 6/5 at 9:40 am to discuss recent echo.

## 2023-01-10 NOTE — Telephone Encounter (Signed)
Spoke with patient about echo results, patient verbalized understanding of his heart not being able to pump as well as it should and the pumping volume is extremely lower than it should be but insistent that he does not want a Life Vest right now. Patient states he's been feeling better since starting Zoloft and believes this has helped his arrhythmias, explained to the patient that while this medication can help his anxiety/panic attacks it would not impact the actual electrical activity of his heart, thus cannot prevent any further runs of Vtach. Explained that the Life Vest is a wearable defibrillator that would offer him protection in the event his heart went into a deadly rhythm, being able to provide a life saving shock when needed. Patient states he's feeling much better and doesn't feel his heart is that weak to need such a device. Offered to make an appointment with Dr Nelly Laurence to discuss but patient would like to follow up with Bernadene Person or Dr Peter [place] first to discuss any medication changes that he may need. Patient states he is no longer having a lot of palpitations and doesn't have a "sinking feeling" any longer. Agreeable with cancelling EP study for now and will follow back up with Dr Nelly Laurence after seeing one of his primary cardiologist at [facility].

## 2023-01-12 ENCOUNTER — Other Ambulatory Visit: Payer: Self-pay | Admitting: Physician Assistant

## 2023-01-12 ENCOUNTER — Other Ambulatory Visit: Payer: Self-pay | Admitting: Cardiology

## 2023-01-12 DIAGNOSIS — I472 Ventricular tachycardia, unspecified: Secondary | ICD-10-CM

## 2023-01-12 MED ORDER — SODIUM CHLORIDE 0.9% FLUSH
3.0000 mL | Freq: Two times a day (BID) | INTRAVENOUS | Status: DC
Start: 2023-01-12 — End: 2023-01-19

## 2023-01-15 NOTE — H&P (View-Only) (Signed)
CARDIOLOGY OFFICE NOTE  Date:  01/17/2023    Jason Maldonado Date of Birth: 05/16/1956 Medical Record #[id number]  PCP:  Patient, No Pcp Per  Cardiologist:  [place]    Chief Complaint  Patient presents with   Congestive Heart Failure    History of Present Illness: Jason Maldonado is a 67 y.o. male who is seen for follow up CAD and new onset CHF.  He has a past medical history of hyperlipidemia, bipolar disorder, tobacco abuse, and a history of CAD status post previous inferior MI in 2014 with emergent PCI to the RCA.   He had second STEMI on 03/10/2015 as a inferior STEMI in the setting of RCA in-stent restenosis/occlusion. Cardiac catheterization on 03/10/2015 showed 100% stenosis of prox and mid RCA prox to a previously placed unknown stent, 4.038 mm Resolute DES stent postdilated to 4.41 mm proximally and 4.1 mm distally. 50% residual RPDA, 40% 1st RPLB, 40%distal RCA lesion. Post cath, he was placed on aspirin and Brilinta. Echo EF 30-35%, basal to mid inferior akinesis, inferoseptal and inferolateral severe hypokinesis. He did have refractory hypotension that gradually improved. Repeat Echo in October 2016 showed inferior HK with EF 50-55%.    On a prior  visit noted a vision change and was diagnosed with a central retinal vein occlusion. Saw a  retinal specialist and has had steroid injections. He did not want to take PCSK 9 inhibitors in the past but is now on  Nexlizet.   He did have left inguinal hernia repair in Oct 2022.   He contacted our office in February complaining of a "sinking feeling", dizziness, lightheadedness associated with palpitations that began in January.  Seen by Bernadene Person NP. A 14-day monitor was placed that showed episodes of wide-complex tachycardia, some quite long > 5 hours. Was seen by Dr Nelly Laurence with EP. Echo done showing significant drop in EF to 20-25% with inferior and inferolateral AK. Seen today to discuss CHF therapy and to arrange cardiac cath.    On follow up today he idoes note some fatigue. No syncope or chest pain. Notes only gets SOB if walking up a lot of stairs. No edema. Is concerned that his psych meds aren't agreeing with him. Was started on sertraline this past month and thinks it is too much for him.    Past Medical History:  Diagnosis Date   Bipolar 1 disorder (HCC)    CAD (coronary artery disease)    a. STEMI 09/2012  single vessel occlusive CAD of the RCA s/p DES to mid RCA, EF 45%;  b. inferior STEMI 03/10/2015 DES to prox and mid RCA   Depression    Hyperlipidemia    LV dysfunction    a. 09/2012 Cath LV gram severe basal to midinferior wall hypokinesis, EF 45% and echo EF 55%.   PAF (paroxysmal atrial fibrillation) (HCC)    a. noted on admission 02/2015 in setting of inf stemi;  b. CHA2DS2VASc = 1.   Tobacco abuse     Past Surgical History:  Procedure Laterality Date   APPENDECTOMY     CARDIAC CATHETERIZATION N/A 03/10/2015   Procedure: Left Heart Cath and Coronary Angiography;  Surgeon: Lennette Bihari, MD;  Location: [facility] INVASIVE CV LAB;  Service: Cardiovascular;  Laterality: N/A;   CARDIAC CATHETERIZATION  03/10/2015   Procedure: Coronary Stent Intervention;  Surgeon: Lennette Bihari, MD;  Location: [facility] INVASIVE CV LAB;  Service: Cardiovascular;;   INGUINAL HERNIA REPAIR Left 05/25/2021  Procedure: OPEN LEFT INGUINAL HERNIA AND EPIGASTRIC HERNIA REPAIR;  Surgeon: Berna Bue, MD;  Location: [facility] ORS;  Service: General;  Laterality: Left;   LEFT HEART CATHETERIZATION WITH CORONARY ANGIOGRAM N/A 09/27/2012   Procedure: LEFT HEART CATHETERIZATION WITH CORONARY ANGIOGRAM;  Surgeon: Ashland Wiseman M [place], MD;  Location: [facility] CATH LAB;  Service: Cardiovascular;  Laterality: N/A;   TONSILLECTOMY       Medications: Current Outpatient Medications  Medication Sig Dispense Refill   aspirin EC 81 MG EC tablet Take 1 tablet (81 mg total) by mouth daily.     buPROPion (WELLBUTRIN) 75 MG tablet TAKE 1 TABLET BY MOUTH EVERY DAY  90 tablet 1   carvedilol (COREG) 6.25 MG tablet Take 1 tablet (6.25 mg total) by mouth 2 (two) times daily. 180 tablet 3   ibuprofen (ADVIL) 200 MG tablet Take 200 mg by mouth daily as needed (pain).     LIVALO 2 MG TABS TAKE 1 TABLET BY MOUTH EVERY DAY 90 tablet 3   loratadine (CLARITIN) 10 MG tablet Take 10 mg by mouth daily as needed for allergies.     NEXLIZET 180-10 MG TABS TAKE 1 TABLET BY MOUTH EVERY DAY 90 tablet 3   nitroGLYCERIN (NITROSTAT) 0.4 MG SL tablet Take as needed for chest pain. 1 tablet under tongue every 5 mins up to 3 tablets.After 3 tablets if chest pain call 911. 25 tablet 11   sacubitril-valsartan (ENTRESTO) 24-26 MG Take 1 tablet by mouth 2 (two) times daily. 60 tablet 11   sacubitril-valsartan (ENTRESTO) 24-26 MG Take 1 tablet by mouth 2 (two) times daily. 180 tablet 3   sertraline (ZOLOFT) 50 MG tablet TAKE 1 TABLET BY MOUTH EVERY DAY 30 tablet 0   ticagrelor (BRILINTA) 60 MG TABS tablet Take 1 tablet (60 mg total) by mouth 2 (two) times daily. 90 tablet 3   Current Facility-Administered Medications  Medication Dose Route Frequency Provider Last Rate Last Admin   sodium chloride flush (NS) 0.9 % injection 3 mL  3 mL Intravenous Q12H [place], Hong Timm M, MD        Allergies: No Known Allergies  Social History: The patient  reports that he has been smoking cigarettes. He has a 20.00 pack-year smoking history. He has never used smokeless tobacco. He reports that he does not currently use alcohol. He reports that he does not use drugs.   Family History: The patient's family history includes CAD in his father.   Review of Systems: Please see the history of present illness.    All other systems are reviewed and negative.   Physical Exam: VS:  BP (!) 148/96   Pulse 71   Ht 5\' 7"  (1.702 m)   Wt 147 lb (66.7 kg)   SpO2 92%   BMI 23.02 kg/m  .  BMI Body mass index is 23.02 kg/m.  Wt Readings from Last 3 Encounters:  01/17/23 147 lb (66.7 kg)  12/07/22 151 lb  11.2 oz (68.8 kg)  09/25/22 147 lb 9.6 oz (67 kg)   GENERAL:  Well appearing WM in NAD HEENT:  PERRL, EOMI, sclera are clear. Oropharynx is clear. NECK:  No jugular venous distention, carotid upstroke brisk and symmetric, no bruits, no thyromegaly or adenopathy LUNGS:  Clear to auscultation bilaterally CHEST:  Unremarkable HEART:  RRR,  PMI not displaced or sustained,S1 and S2 within normal limits, no S3, no S4: no clicks, no rubs, no murmurs ABD:  Soft, nontender. BS +, no masses or bruits. No hepatomegaly, no  splenomegaly EXT:  2 + pulses throughout, no edema, no cyanosis no clubbing SKIN:  Warm and dry.  No rashes NEURO:  Alert and oriented x 3. Cranial nerves II through XII intact. PSYCH:  Cognitively intact     LABORATORY DATA:  EKG:  EKG is not ordered today. Done 12/07/22  NSR old inferior infarct. Lateral T wave inversion. No change. Occ. PVCs. I have personally reviewed and interpreted this study.     Lab Results  Component Value Date   WBC 8.1 09/25/2022   HGB 14.8 09/25/2022   HCT 45.6 09/25/2022   PLT 292 09/25/2022   GLUCOSE 91 09/25/2022   CHOL 135 04/25/2022   TRIG 89 04/25/2022   HDL 49 04/25/2022   LDLCALC 69 04/25/2022   ALT 13 04/25/2022   AST 17 04/25/2022   NA 139 09/25/2022   K 4.9 09/25/2022   CL 102 09/25/2022   CREATININE 1.37 (H) 09/25/2022   BUN 25 09/25/2022   CO2 23 09/25/2022   TSH 1.690 09/25/2022   PSA 1.30 08/03/2015   INR 1.18 09/27/2012   HGBA1C 5.7 (H) 09/27/2012    BNP (last 3 results) No results for input(s): "BNP" in the last 8760 hours.  ProBNP (last 3 results) No results for input(s): "PROBNP" in the last 8760 hours.   Other Studies Reviewed Today: Echo: 06/08/15:Study Conclusions  - Procedure narrative: Limited transthoracic echocardiography for   left ventricular function evaluation. Image quality was adequate. - Left ventricle: The cavity size was normal. Wall thickness was   normal. Systolic function was  normal. The estimated ejection   fraction was in the range of 50% to 55%. There is inferior and   inferoseptal hypokinesis.  Impressions:  - Limited echo for LV function. There is inferior and inferoseptal   hypokinesis. LVEF has improved to 50-55%.  Event monitor 10/30/22: Study Highlights      Normal sinus rhythm   Frequent and sustained runs of wide complex tachycardia most c/w ventricular tachycardia. longest lasting 5 hours 47 minutes. Patient symptoms correlate with this.   Rare brief runs of SVT.     Patch Wear Time:  13 days and 23 hours (2024-02-17T16:17:09-498 to 2024-03-02T16:16:58-0500)   Patient had a min HR of 40 bpm, max HR of 210 bpm, and avg HR of 87 bpm. Predominant underlying rhythm was Sinus Rhythm. First Degree AV Block was present. 274 Ventricular Tachycardia runs occurred, the run with the fastest interval lasting 5 hours 47  mins with a max rate of 210 bpm (avg 161 bpm); the run with the fastest interval was also the longest. 5 Supraventricular Tachycardia runs occurred, the run with the fastest interval lasting 11 beats with a max rate of 156 bpm (avg 138 bpm); the run with  the fastest interval was also the longest. 1 episode(s) of AV Block (2nd) occurred, lasting a total of 2 secs. Ventricular Tachycardia was detected within +/- 45 seconds of symptomatic patient event(s). Isolated SVEs were frequent (8.3%, [id number]), SVE  Couplets were rare (<1.0%, 508), and SVE Triplets were rare (<1.0%, 546). Isolated VEs were occasional (1.8%, 31436), VE Couplets were rare (<1.0%, 16), and VE Triplets were rare (<1.0%, 4). Ventricular Bigeminy and Trigeminy were present. MD  notification criteria for Ventricular Tachycardia met - notified Karrie Doffing on 30 Oct 2022 at 10:03 am CT (MD).    Echo 01/05/23: IMPRESSIONS     1. Global hypokinesis with akinesis of the inferior and inferolateral  walls; overall severe LV dysfunction.  2. Left ventricular ejection fraction, by  estimation, is 25 to 30%. The  left ventricle has severely decreased function. The left ventricle  demonstrates regional wall motion abnormalities (see scoring  diagram/findings for description). The left  ventricular internal cavity size was mildly dilated. There is moderate  left ventricular hypertrophy. Left ventricular diastolic parameters are  consistent with Grade I diastolic dysfunction (impaired relaxation).   3. Right ventricular systolic function is normal. The right ventricular  size is normal.   4. Left atrial size was mildly dilated.   5. The mitral valve is normal in structure. No evidence of mitral valve  regurgitation. No evidence of mitral stenosis.   6. The aortic valve is tricuspid. Aortic valve regurgitation is not  visualized. Aortic valve sclerosis is present, with no evidence of aortic  valve stenosis.   7. The inferior vena cava is normal in size with greater than 50%  respiratory variability, suggesting right atrial pressure of 3 mmHg.   Comparison(s): No prior Echocardiogram.    Assessment/Plan:  1. CAD s/p inferior MI with stenting in 2014. Recurrent STEMI in July 2016 with instent restenosis/occlusion. S/p repeat DES. On DAPT. Would favor continuing this long term. Continue Brilinta 60 mg bid and ASA 81 mg daily. Continue low dose beta blocker. Unable to tolerate higher dose due to low BP in past. Now with severe LV dysfunction and wide complex tachycardia- likely VT. Need to reassess coronary anatomy. Will plan on right and left heart cath with possible PCI on Friday. The procedure and risks were reviewed including but not limited to death, myocardial infarction, stroke, arrythmias, bleeding, transfusion, emergency surgery, dye allergy, or renal dysfunction. The patient voices understanding and is agreeable to proceed. Prior cath in 2014 unable to access via right radial approach due to stenosis. Will plan on left radial approach.    2. Wide complex tachycardia  most likely V tach. Further plans pending assessment of coronary circulation and optimization of CHF therapy   3. Acute systolic CHF. EF 20-25%. Does not appear to be volume overloaded. Will try and add Entresto 24/26 mg bid. Observe BP response closely. Continue Coreg at current dose. Consider SGLT 2 inhibitor.    4. Hyperlipidemia. Intolerant of lipitor. Now on Livalo 2 mg and Nexlizet with good control. LDL down to 69.  will update labs today  5.. Bipolar disorder.   6. Tobacco abuse. Encourage efforts at complete smoking cessation.   Current medicines are reviewed with the patient today.  The patient does not have concerns regarding medicines other than what has been noted above.  The following changes have been made:  See above.  Labs/ tests ordered today include:    Orders Placed This Encounter  Procedures   Basic metabolic panel   CBC w/Diff/Platelet   PT and PTT   Lipid Profile     Disposition:   FU  post cath  Signed: Marlee Trentman [place] MD, [facility]    01/17/2023 11:09 AM

## 2023-01-15 NOTE — Progress Notes (Unsigned)
CARDIOLOGY OFFICE NOTE  Date:  01/17/2023    [name] Date of Birth: 05/16/1956 Medical Record #[id number]  PCP:  Patient, No Pcp Per  Cardiologist:  [place]    Chief Complaint  Patient presents with   Congestive Heart Failure    History of Present Illness: Jason Maldonado is a 67 y.o. male who is seen for follow up CAD and new onset CHF.  He has a past medical history of hyperlipidemia, bipolar disorder, tobacco abuse, and a history of CAD status post previous inferior MI in 2014 with emergent PCI to the RCA.   He had second STEMI on 03/10/2015 as a inferior STEMI in the setting of RCA in-stent restenosis/occlusion. Cardiac catheterization on 03/10/2015 showed 100% stenosis of prox and mid RCA prox to a previously placed unknown stent, 4.038 mm Resolute DES stent postdilated to 4.41 mm proximally and 4.1 mm distally. 50% residual RPDA, 40% 1st RPLB, 40%distal RCA lesion. Post cath, he was placed on aspirin and Brilinta. Echo EF 30-35%, basal to mid inferior akinesis, inferoseptal and inferolateral severe hypokinesis. He did have refractory hypotension that gradually improved. Repeat Echo in October 2016 showed inferior HK with EF 50-55%.    On a prior  visit noted a vision change and was diagnosed with a central retinal vein occlusion. Saw a  retinal specialist and has had steroid injections. He did not want to take PCSK 9 inhibitors in the past but is now on  Nexlizet.   He did have left inguinal hernia repair in Oct 2022.   He contacted our office in February complaining of a "sinking feeling", dizziness, lightheadedness associated with palpitations that began in January.  Seen by Bernadene Person NP. A 14-day monitor was placed that showed episodes of wide-complex tachycardia, some quite long > 5 hours. Was seen by Dr Nelly Laurence with EP. Echo done showing significant drop in EF to 20-25% with inferior and inferolateral AK. Seen today to discuss CHF therapy and to arrange cardiac cath.    On follow up today he idoes note some fatigue. No syncope or chest pain. Notes only gets SOB if walking up a lot of stairs. No edema. Is concerned that his psych meds aren't agreeing with him. Was started on sertraline this past month and thinks it is too much for him.    Past Medical History:  Diagnosis Date   Bipolar 1 disorder (HCC)    CAD (coronary artery disease)    a. STEMI 09/2012  single vessel occlusive CAD of the RCA s/p DES to mid RCA, EF 45%;  b. inferior STEMI 03/10/2015 DES to prox and mid RCA   Depression    Hyperlipidemia    LV dysfunction    a. 09/2012 Cath LV gram severe basal to midinferior wall hypokinesis, EF 45% and echo EF 55%.   PAF (paroxysmal atrial fibrillation) (HCC)    a. noted on admission 02/2015 in setting of inf stemi;  b. CHA2DS2VASc = 1.   Tobacco abuse     Past Surgical History:  Procedure Laterality Date   APPENDECTOMY     CARDIAC CATHETERIZATION N/A 03/10/2015   Procedure: Left Heart Cath and Coronary Angiography;  Surgeon: Lennette Bihari, MD;  Location: [facility] INVASIVE CV LAB;  Service: Cardiovascular;  Laterality: N/A;   CARDIAC CATHETERIZATION  03/10/2015   Procedure: Coronary Stent Intervention;  Surgeon: Lennette Bihari, MD;  Location: [facility] INVASIVE CV LAB;  Service: Cardiovascular;;   INGUINAL HERNIA REPAIR Left 05/25/2021  Procedure: OPEN LEFT INGUINAL HERNIA AND EPIGASTRIC HERNIA REPAIR;  Surgeon: Berna Bue, MD;  Location: [facility] ORS;  Service: General;  Laterality: Left;   LEFT HEART CATHETERIZATION WITH CORONARY ANGIOGRAM N/A 09/27/2012   Procedure: LEFT HEART CATHETERIZATION WITH CORONARY ANGIOGRAM;  Surgeon: Evaan Tidwell M [place], MD;  Location: [facility] CATH LAB;  Service: Cardiovascular;  Laterality: N/A;   TONSILLECTOMY       Medications: Current Outpatient Medications  Medication Sig Dispense Refill   aspirin EC 81 MG EC tablet Take 1 tablet (81 mg total) by mouth daily.     buPROPion (WELLBUTRIN) 75 MG tablet TAKE 1 TABLET BY MOUTH EVERY DAY  90 tablet 1   carvedilol (COREG) 6.25 MG tablet Take 1 tablet (6.25 mg total) by mouth 2 (two) times daily. 180 tablet 3   ibuprofen (ADVIL) 200 MG tablet Take 200 mg by mouth daily as needed (pain).     LIVALO 2 MG TABS TAKE 1 TABLET BY MOUTH EVERY DAY 90 tablet 3   loratadine (CLARITIN) 10 MG tablet Take 10 mg by mouth daily as needed for allergies.     NEXLIZET 180-10 MG TABS TAKE 1 TABLET BY MOUTH EVERY DAY 90 tablet 3   nitroGLYCERIN (NITROSTAT) 0.4 MG SL tablet Take as needed for chest pain. 1 tablet under tongue every 5 mins up to 3 tablets.After 3 tablets if chest pain call 911. 25 tablet 11   sacubitril-valsartan (ENTRESTO) 24-26 MG Take 1 tablet by mouth 2 (two) times daily. 60 tablet 11   sacubitril-valsartan (ENTRESTO) 24-26 MG Take 1 tablet by mouth 2 (two) times daily. 180 tablet 3   sertraline (ZOLOFT) 50 MG tablet TAKE 1 TABLET BY MOUTH EVERY DAY 30 tablet 0   ticagrelor (BRILINTA) 60 MG TABS tablet Take 1 tablet (60 mg total) by mouth 2 (two) times daily. 90 tablet 3   Current Facility-Administered Medications  Medication Dose Route Frequency Provider Last Rate Last Admin   sodium chloride flush (NS) 0.9 % injection 3 mL  3 mL Intravenous Q12H [place], Idella Lamontagne M, MD        Allergies: No Known Allergies  Social History: The patient  reports that he has been smoking cigarettes. He has a 20.00 pack-year smoking history. He has never used smokeless tobacco. He reports that he does not currently use alcohol. He reports that he does not use drugs.   Family History: The patient's family history includes CAD in his father.   Review of Systems: Please see the history of present illness.    All other systems are reviewed and negative.   Physical Exam: VS:  BP (!) 148/96   Pulse 71   Ht 5\' 7"  (1.702 m)   Wt 147 lb (66.7 kg)   SpO2 92%   BMI 23.02 kg/m  .  BMI Body mass index is 23.02 kg/m.  Wt Readings from Last 3 Encounters:  01/17/23 147 lb (66.7 kg)  12/07/22 151 lb  11.2 oz (68.8 kg)  09/25/22 147 lb 9.6 oz (67 kg)   GENERAL:  Well appearing WM in NAD HEENT:  PERRL, EOMI, sclera are clear. Oropharynx is clear. NECK:  No jugular venous distention, carotid upstroke brisk and symmetric, no bruits, no thyromegaly or adenopathy LUNGS:  Clear to auscultation bilaterally CHEST:  Unremarkable HEART:  RRR,  PMI not displaced or sustained,S1 and S2 within normal limits, no S3, no S4: no clicks, no rubs, no murmurs ABD:  Soft, nontender. BS +, no masses or bruits. No hepatomegaly, no  splenomegaly EXT:  2 + pulses throughout, no edema, no cyanosis no clubbing SKIN:  Warm and dry.  No rashes NEURO:  Alert and oriented x 3. Cranial nerves II through XII intact. PSYCH:  Cognitively intact     LABORATORY DATA:  EKG:  EKG is not ordered today. Done 12/07/22  NSR old inferior infarct. Lateral T wave inversion. No change. Occ. PVCs. I have personally reviewed and interpreted this study.     Lab Results  Component Value Date   WBC 8.1 09/25/2022   HGB 14.8 09/25/2022   HCT 45.6 09/25/2022   PLT 292 09/25/2022   GLUCOSE 91 09/25/2022   CHOL 135 04/25/2022   TRIG 89 04/25/2022   HDL 49 04/25/2022   LDLCALC 69 04/25/2022   ALT 13 04/25/2022   AST 17 04/25/2022   NA 139 09/25/2022   K 4.9 09/25/2022   CL 102 09/25/2022   CREATININE 1.37 (H) 09/25/2022   BUN 25 09/25/2022   CO2 23 09/25/2022   TSH 1.690 09/25/2022   PSA 1.30 08/03/2015   INR 1.18 09/27/2012   HGBA1C 5.7 (H) 09/27/2012    BNP (last 3 results) No results for input(s): "BNP" in the last 8760 hours.  ProBNP (last 3 results) No results for input(s): "PROBNP" in the last 8760 hours.   Other Studies Reviewed Today: Echo: 06/08/15:Study Conclusions  - Procedure narrative: Limited transthoracic echocardiography for   left ventricular function evaluation. Image quality was adequate. - Left ventricle: The cavity size was normal. Wall thickness was   normal. Systolic function was  normal. The estimated ejection   fraction was in the range of 50% to 55%. There is inferior and   inferoseptal hypokinesis.  Impressions:  - Limited echo for LV function. There is inferior and inferoseptal   hypokinesis. LVEF has improved to 50-55%.  Event monitor 10/30/22: Study Highlights      Normal sinus rhythm   Frequent and sustained runs of wide complex tachycardia most c/w ventricular tachycardia. longest lasting 5 hours 47 minutes. Patient symptoms correlate with this.   Rare brief runs of SVT.     Patch Wear Time:  13 days and 23 hours (2024-02-17T16:17:09-498 to 2024-03-02T16:16:58-0500)   Patient had a min HR of 40 bpm, max HR of 210 bpm, and avg HR of 87 bpm. Predominant underlying rhythm was Sinus Rhythm. First Degree AV Block was present. 274 Ventricular Tachycardia runs occurred, the run with the fastest interval lasting 5 hours 47  mins with a max rate of 210 bpm (avg 161 bpm); the run with the fastest interval was also the longest. 5 Supraventricular Tachycardia runs occurred, the run with the fastest interval lasting 11 beats with a max rate of 156 bpm (avg 138 bpm); the run with  the fastest interval was also the longest. 1 episode(s) of AV Block (2nd) occurred, lasting a total of 2 secs. Ventricular Tachycardia was detected within +/- 45 seconds of symptomatic patient event(s). Isolated SVEs were frequent (8.3%, [id number]), SVE  Couplets were rare (<1.0%, 508), and SVE Triplets were rare (<1.0%, 546). Isolated VEs were occasional (1.8%, 31436), VE Couplets were rare (<1.0%, 16), and VE Triplets were rare (<1.0%, 4). Ventricular Bigeminy and Trigeminy were present. MD  notification criteria for Ventricular Tachycardia met - notified Karrie Doffing on 30 Oct 2022 at 10:03 am CT (MD).    Echo 01/05/23: IMPRESSIONS     1. Global hypokinesis with akinesis of the inferior and inferolateral  walls; overall severe LV dysfunction.  2. Left ventricular ejection fraction, by  estimation, is 25 to 30%. The  left ventricle has severely decreased function. The left ventricle  demonstrates regional wall motion abnormalities (see scoring  diagram/findings for description). The left  ventricular internal cavity size was mildly dilated. There is moderate  left ventricular hypertrophy. Left ventricular diastolic parameters are  consistent with Grade I diastolic dysfunction (impaired relaxation).   3. Right ventricular systolic function is normal. The right ventricular  size is normal.   4. Left atrial size was mildly dilated.   5. The mitral valve is normal in structure. No evidence of mitral valve  regurgitation. No evidence of mitral stenosis.   6. The aortic valve is tricuspid. Aortic valve regurgitation is not  visualized. Aortic valve sclerosis is present, with no evidence of aortic  valve stenosis.   7. The inferior vena cava is normal in size with greater than 50%  respiratory variability, suggesting right atrial pressure of 3 mmHg.   Comparison(s): No prior Echocardiogram.    Assessment/Plan:  1. CAD s/p inferior MI with stenting in 2014. Recurrent STEMI in July 2016 with instent restenosis/occlusion. S/p repeat DES. On DAPT. Would favor continuing this long term. Continue Brilinta 60 mg bid and ASA 81 mg daily. Continue low dose beta blocker. Unable to tolerate higher dose due to low BP in past. Now with severe LV dysfunction and wide complex tachycardia- likely VT. Need to reassess coronary anatomy. Will plan on right and left heart cath with possible PCI on Friday. The procedure and risks were reviewed including but not limited to death, myocardial infarction, stroke, arrythmias, bleeding, transfusion, emergency surgery, dye allergy, or renal dysfunction. The patient voices understanding and is agreeable to proceed. Prior cath in 2014 unable to access via right radial approach due to stenosis. Will plan on left radial approach.    2. Wide complex tachycardia  most likely V tach. Further plans pending assessment of coronary circulation and optimization of CHF therapy   3. Acute systolic CHF. EF 20-25%. Does not appear to be volume overloaded. Will try and add Entresto 24/26 mg bid. Observe BP response closely. Continue Coreg at current dose. Consider SGLT 2 inhibitor.    4. Hyperlipidemia. Intolerant of lipitor. Now on Livalo 2 mg and Nexlizet with good control. LDL down to 69.  will update labs today  5.. Bipolar disorder.   6. Tobacco abuse. Encourage efforts at complete smoking cessation.   Current medicines are reviewed with the patient today.  The patient does not have concerns regarding medicines other than what has been noted above.  The following changes have been made:  See above.  Labs/ tests ordered today include:    Orders Placed This Encounter  Procedures   Basic metabolic panel   CBC w/Diff/Platelet   PT and PTT   Lipid Profile     Disposition:   FU  post cath  Signed: Shenee Wignall [place] MD, [facility]    01/17/2023 11:09 AM

## 2023-01-17 ENCOUNTER — Telehealth: Payer: Self-pay | Admitting: Physician Assistant

## 2023-01-17 ENCOUNTER — Other Ambulatory Visit: Payer: Self-pay | Admitting: Cardiology

## 2023-01-17 ENCOUNTER — Ambulatory Visit: Payer: BC Managed Care – PPO | Attending: Cardiology | Admitting: Cardiology

## 2023-01-17 ENCOUNTER — Encounter: Payer: Self-pay | Admitting: Cardiology

## 2023-01-17 VITALS — BP 148/96 | HR 71 | Ht 67.0 in | Wt 147.0 lb

## 2023-01-17 DIAGNOSIS — E785 Hyperlipidemia, unspecified: Secondary | ICD-10-CM | POA: Diagnosis not present

## 2023-01-17 DIAGNOSIS — Z720 Tobacco use: Secondary | ICD-10-CM

## 2023-01-17 DIAGNOSIS — I5023 Acute on chronic systolic (congestive) heart failure: Secondary | ICD-10-CM

## 2023-01-17 DIAGNOSIS — I5043 Acute on chronic combined systolic (congestive) and diastolic (congestive) heart failure: Secondary | ICD-10-CM

## 2023-01-17 DIAGNOSIS — I472 Ventricular tachycardia, unspecified: Secondary | ICD-10-CM

## 2023-01-17 LAB — BASIC METABOLIC PANEL
BUN/Creatinine Ratio: 13 (ref 10–24)
BUN: 17 mg/dL (ref 8–27)
CO2: 27 mmol/L (ref 20–29)
Calcium: 10.1 mg/dL (ref 8.6–10.2)
Chloride: 104 mmol/L (ref 96–106)
Creatinine, Ser: 1.28 mg/dL — ABNORMAL HIGH (ref 0.76–1.27)
Glucose: 78 mg/dL (ref 70–99)
Potassium: 5 mmol/L (ref 3.5–5.2)
Sodium: 140 mmol/L (ref 134–144)
eGFR: 61 mL/min/{1.73_m2} (ref >59)

## 2023-01-17 LAB — CBC WITH DIFFERENTIAL/PLATELET
Basophils Absolute: 0 10*3/uL (ref 0.0–0.2)
Basos: 0 %
EOS (ABSOLUTE): 0.2 10*3/uL (ref 0.0–0.4)
Eos: 3 %
Hematocrit: 42.6 % (ref 37.5–51.0)
Hemoglobin: 14.4 g/dL (ref 13.0–17.7)
Lymphocytes Absolute: 1.4 10*3/uL (ref 0.7–3.1)
Lymphs: 17 %
MCH: 27.3 pg (ref 26.6–33.0)
MCHC: 33.8 g/dL (ref 31.5–35.7)
MCV: 81 fL (ref 79–97)
Monocytes Absolute: 0.8 10*3/uL (ref 0.1–0.9)
Monocytes: 9 %
Neutrophils Absolute: 5.8 10*3/uL (ref 1.4–7.0)
Neutrophils: 71 %
Platelets: 221 10*3/uL (ref 150–450)
RBC: 5.28 x10E6/uL (ref 4.14–5.80)
RDW: 14.5 % (ref 11.6–15.4)
WBC: 8.2 10*3/uL (ref 3.4–10.8)

## 2023-01-17 LAB — LIPID PANEL
Chol/HDL Ratio: 3 ratio (ref 0.0–5.0)
Cholesterol, Total: 166 mg/dL (ref 100–199)
HDL: 55 mg/dL (ref >39)
LDL Chol Calc (NIH): 94 mg/dL (ref 0–99)
Triglycerides: 91 mg/dL (ref 0–149)
VLDL Cholesterol Cal: 17 mg/dL (ref 5–40)

## 2023-01-17 LAB — PT AND PTT
INR: 1 (ref 0.9–1.2)
Prothrombin Time: 10.1 s (ref 9.1–12.0)
aPTT: 25 s (ref 24–33)

## 2023-01-17 MED ORDER — SODIUM CHLORIDE 0.9% FLUSH
3.0000 mL | Freq: Two times a day (BID) | INTRAVENOUS | Status: DC
Start: 2023-01-17 — End: 2023-01-19

## 2023-01-17 MED ORDER — SACUBITRIL-VALSARTAN 24-26 MG PO TABS: 1.0000 | ORAL_TABLET | Freq: Two times a day (BID) | ORAL | 3 refills | Status: DC

## 2023-01-17 MED ORDER — SACUBITRIL-VALSARTAN 24-26 MG PO TABS: 1.0000 | ORAL_TABLET | Freq: Two times a day (BID) | ORAL | 11 refills | Status: DC

## 2023-01-17 NOTE — Telephone Encounter (Signed)
Patient notified

## 2023-01-17 NOTE — Telephone Encounter (Signed)
Patient reporting dizziness with Zoloft 50 mg. He takes at night per your suggestion. He said that he will also wake up in the middle of the night. He is also c/o SSE. He said he is dose sensitive and asking if he should cut tablet in half. He is having a heart catheterization on Friday.

## 2023-01-17 NOTE — Telephone Encounter (Signed)
Yes, have him cut pill in half.

## 2023-01-17 NOTE — Patient Instructions (Addendum)
Medication Instructions:  Start Entresto 24/26 twice daily. Continue all other medications   Lab Work: BMP, CBC, PT-To be done now Lipid panel today  Testing/Procedures: Cardiac Cath scheduled for Friday January 19, 2023 at [facility] hospital follow instructions below.   Follow-Up: At [facility], you and your health needs are our priority.  As part of our continuing mission to provide you with exceptional heart care, we have created designated Provider Care Teams.  These Care Teams include your primary Cardiologist (physician) and Advanced Practice Providers (APPs -  Physician Assistants and Nurse Practitioners) who all work together to provide you with the care you need, when you need it.  We recommend signing up for the patient portal called "MyChart".  Sign up information is provided on this After Visit Summary.  MyChart is used to connect with patients for Virtual Visits (Telemedicine).  Patients are able to view lab/test results, encounter notes, upcoming appointments, etc.  Non-urgent messages can be sent to your provider as well.   To learn more about what you can do with MyChart, go to [email or web address].    Your next appointment:   After Cath  Provider:  Dr. [place]    [facility] A DEPT OF [place]. [facility] AT [facility] [id number] [place] [address] Dept: [phone] Loc: [phone]  Jason Maldonado  01/17/2023  You are scheduled for a Cardiac Catheterization on Friday, June 7 with Dr. Peter [place].  1. Please arrive at the [facility] (Main Entrance A) at [facility]: [address] [place], [place] [address] at 5:30 AM (This time is 2 hour(s) before your procedure to ensure your preparation). Free valet parking service is available. You will check in at ADMITTING. The support person will be asked to wait in the waiting room.  It is OK to have someone drop you off and  come back when you are ready to be discharged.    Special note: Every effort is made to have your procedure done on time. Please understand that emergencies sometimes delay scheduled procedures.  2. Diet: Do not eat solid foods after midnight.  The patient may have clear liquids until 5am upon the day of the procedure.  3. Labs: Will have CBC, Lipid, BMP, and PT done today.   4. Medication instructions in preparation for your procedure:   Contrast Allergy: No  On the morning of your procedure, take your Aspirin 81 mg, Brilinta 60 MG, and any morning medicines NOT listed above.  You may use sips of water.  5. Plan to go home the same day, you will only stay overnight if medically necessary. 6. Bring a current list of your medications and current insurance cards. 7. You MUST have a responsible person to drive you home. 8. Someone MUST be with you the first 24 hours after you arrive home or your discharge will be delayed. 9. Please wear clothes that are easy to get on and off and wear slip-on shoes.  Thank you for allowing [place] to care for you!   -- [facility]

## 2023-01-17 NOTE — Telephone Encounter (Signed)
Patient lvm to cancel appointment scheduled for 6/7. Msg stated he had some concerns with the new medication he just started. He would like to address these concerns with someone today if possible.  Contact # [phone]

## 2023-01-18 ENCOUNTER — Telehealth: Payer: Self-pay | Admitting: *Deleted

## 2023-01-18 ENCOUNTER — Ambulatory Visit: Payer: BC Managed Care – PPO | Admitting: Nurse Practitioner

## 2023-01-18 NOTE — Telephone Encounter (Addendum)
Cardiac Catheterization scheduled at [facility] for: Friday January 19, 2023 7:30 AM Arrival time [facility] Main Entrance A at: 5:30 AM  Nothing to eat after midnight prior to procedure, clear liquids until 5 AM day of procedure.  Medication instructions: -Usual morning medications can be taken with sips of water including aspirin 81 mg and Brilinta 60 mg  Confirmed patient has responsible adult to drive home post procedure and be with patient first 24 hours after arriving home.  Plan to go home the same day, you will only stay overnight if medically necessary.   Left message for patient to call back to review procedure instructions  Patient returned call, did not want to review procedure instructions over the telephone, states he has copy of instructions given to him yesterday.

## 2023-01-18 NOTE — Telephone Encounter (Addendum)
Patient reports he will have transportation to and from the hospital. Patient reports he lives alone and does not have anyone to be with him the first 24 hours if same day discharge. Patient advised he will need to plan to stay overnight at the hospital if no one to be with him. Patient states he will only stay overnight if Dr [place] talks with him after procedure and tells him it is necessary for him to stay overnight.

## 2023-01-19 ENCOUNTER — Ambulatory Visit (HOSPITAL_COMMUNITY)
Admission: RE | Disposition: A | Discharge status: Home / Self Care | Payer: Self-pay | Source: Home / Self Care | Attending: Cardiology

## 2023-01-19 ENCOUNTER — Other Ambulatory Visit: Payer: Self-pay

## 2023-01-19 ENCOUNTER — Ambulatory Visit: Payer: BC Managed Care – PPO | Admitting: Physician Assistant

## 2023-01-19 ENCOUNTER — Ambulatory Visit (HOSPITAL_COMMUNITY)
Admission: RE | Admit: 2023-01-19 | Discharge: 2023-01-19 | Disposition: A | Discharge status: Home / Self Care | Payer: BC Managed Care – PPO | Attending: Cardiology | Admitting: Cardiology

## 2023-01-19 DIAGNOSIS — E785 Hyperlipidemia, unspecified: Secondary | ICD-10-CM | POA: Diagnosis not present

## 2023-01-19 DIAGNOSIS — F17210 Nicotine dependence, cigarettes, uncomplicated: Secondary | ICD-10-CM | POA: Diagnosis not present

## 2023-01-19 DIAGNOSIS — Z7902 Long term (current) use of antithrombotics/antiplatelets: Secondary | ICD-10-CM | POA: Insufficient documentation

## 2023-01-19 DIAGNOSIS — Z955 Presence of coronary angioplasty implant and graft: Secondary | ICD-10-CM | POA: Insufficient documentation

## 2023-01-19 DIAGNOSIS — I252 Old myocardial infarction: Secondary | ICD-10-CM | POA: Insufficient documentation

## 2023-01-19 DIAGNOSIS — I5043 Acute on chronic combined systolic (congestive) and diastolic (congestive) heart failure: Secondary | ICD-10-CM

## 2023-01-19 DIAGNOSIS — I5021 Acute systolic (congestive) heart failure: Secondary | ICD-10-CM | POA: Insufficient documentation

## 2023-01-19 DIAGNOSIS — Z7982 Long term (current) use of aspirin: Secondary | ICD-10-CM | POA: Insufficient documentation

## 2023-01-19 DIAGNOSIS — I472 Ventricular tachycardia, unspecified: Secondary | ICD-10-CM | POA: Insufficient documentation

## 2023-01-19 DIAGNOSIS — Z79899 Other long term (current) drug therapy: Secondary | ICD-10-CM | POA: Insufficient documentation

## 2023-01-19 DIAGNOSIS — I2510 Atherosclerotic heart disease of native coronary artery without angina pectoris: Secondary | ICD-10-CM | POA: Diagnosis not present

## 2023-01-19 DIAGNOSIS — F319 Bipolar disorder, unspecified: Secondary | ICD-10-CM | POA: Insufficient documentation

## 2023-01-19 HISTORY — PX: RIGHT/LEFT HEART CATH AND CORONARY ANGIOGRAPHY: CATH118266

## 2023-01-19 LAB — POCT I-STAT EG7
Acid-base deficit: 1 mmol/L (ref 0.0–2.0)
Acid-base deficit: 1 mmol/L (ref 0.0–2.0)
Bicarbonate: 22.1 mmol/L (ref 20.0–28.0)
Bicarbonate: 22.6 mmol/L (ref 20.0–28.0)
Calcium, Ion: 1.17 mmol/L (ref 1.15–1.40)
Calcium, Ion: 1.18 mmol/L (ref 1.15–1.40)
HCT: 47 % (ref 39.0–52.0)
HCT: 47 % (ref 39.0–52.0)
Hemoglobin: 16 g/dL (ref 13.0–17.0)
Hemoglobin: 16 g/dL (ref 13.0–17.0)
O2 Saturation: 62 %
O2 Saturation: 63 %
Potassium: 4.1 mmol/L (ref 3.5–5.1)
Potassium: 4.1 mmol/L (ref 3.5–5.1)
Sodium: 137 mmol/L (ref 135–145)
Sodium: 138 mmol/L (ref 135–145)
TCO2: 23 mmol/L (ref 22–32)
TCO2: 24 mmol/L (ref 22–32)
pCO2, Ven: 33.4 mmHg — ABNORMAL LOW (ref 44–60)
pCO2, Ven: 34.2 mmHg — ABNORMAL LOW (ref 44–60)
pH, Ven: 7.428 (ref 7.25–7.43)
pH, Ven: 7.429 (ref 7.25–7.43)
pO2, Ven: 31 mmHg — CL (ref 32–45)
pO2, Ven: 31 mmHg — CL (ref 32–45)

## 2023-01-19 LAB — POCT I-STAT 7, (LYTES, BLD GAS, ICA,H+H)
Acid-base deficit: 2 mmol/L (ref 0.0–2.0)
Bicarbonate: 18.7 mmol/L — ABNORMAL LOW (ref 20.0–28.0)
Calcium, Ion: 1.11 mmol/L — ABNORMAL LOW (ref 1.15–1.40)
HCT: 46 % (ref 39.0–52.0)
Hemoglobin: 15.6 g/dL (ref 13.0–17.0)
O2 Saturation: 98 %
Potassium: 4.1 mmol/L (ref 3.5–5.1)
Sodium: 138 mmol/L (ref 135–145)
TCO2: 19 mmol/L — ABNORMAL LOW (ref 22–32)
pCO2 arterial: 23.7 mmHg — ABNORMAL LOW (ref 32–48)
pH, Arterial: 7.503 — ABNORMAL HIGH (ref 7.35–7.45)
pO2, Arterial: 94 mmHg (ref 83–108)

## 2023-01-19 SURGERY — RIGHT/LEFT HEART CATH AND CORONARY ANGIOGRAPHY
Anesthesia: LOCAL

## 2023-01-19 MED ORDER — ACETAMINOPHEN 325 MG PO TABS: 650.0000 mg | ORAL_TABLET | ORAL | Status: DC | PRN

## 2023-01-19 MED ORDER — SODIUM CHLORIDE 0.9 % IV SOLN: 250.0000 mL | INTRAVENOUS | Status: DC | PRN

## 2023-01-19 MED ORDER — LIDOCAINE HCL (PF) 1 % IJ SOLN
INTRAMUSCULAR | Status: DC | PRN
  Administered 2023-01-19: 4 mL

## 2023-01-19 MED ORDER — SODIUM CHLORIDE 0.9 % WEIGHT BASED INFUSION: 1.0000 mL/kg/h | INTRAVENOUS | Status: AC

## 2023-01-19 MED ORDER — ONDANSETRON HCL 4 MG/2ML IJ SOLN: 4.0000 mg | Freq: Four times a day (QID) | INTRAMUSCULAR | Status: DC | PRN

## 2023-01-19 MED ORDER — IOHEXOL 350 MG/ML SOLN
INTRAVENOUS | Status: DC | PRN
  Administered 2023-01-19: 45 mL

## 2023-01-19 MED ORDER — HEPARIN SODIUM (PORCINE) 1000 UNIT/ML IJ SOLN
INTRAMUSCULAR | Status: DC | PRN
  Administered 2023-01-19: 3500 [IU] via INTRAVENOUS

## 2023-01-19 MED ORDER — SODIUM CHLORIDE 0.9% FLUSH: 3.0000 mL | INTRAVENOUS | Status: DC | PRN

## 2023-01-19 MED ORDER — VERAPAMIL HCL 2.5 MG/ML IV SOLN
INTRAVENOUS | Status: AC
  Filled 2023-01-19: qty 2

## 2023-01-19 MED ORDER — SODIUM CHLORIDE 0.9 % IV SOLN: INTRAVENOUS | Status: DC

## 2023-01-19 MED ORDER — HEPARIN SODIUM (PORCINE) 1000 UNIT/ML IJ SOLN
INTRAMUSCULAR | Status: AC
  Filled 2023-01-19: qty 10

## 2023-01-19 MED ORDER — SODIUM CHLORIDE 0.9% FLUSH: 3.0000 mL | Freq: Two times a day (BID) | INTRAVENOUS | Status: DC

## 2023-01-19 MED ORDER — ASPIRIN 81 MG PO CHEW: 81.0000 mg | CHEWABLE_TABLET | ORAL | Status: DC

## 2023-01-19 MED ORDER — EMPAGLIFLOZIN 10 MG PO TABS: 10.0000 mg | ORAL_TABLET | Freq: Every day | ORAL | 11 refills | Status: DC

## 2023-01-19 MED ORDER — LIDOCAINE HCL (PF) 1 % IJ SOLN
INTRAMUSCULAR | Status: AC
  Filled 2023-01-19: qty 30

## 2023-01-19 MED ORDER — HEPARIN (PORCINE) IN NACL 1000-0.9 UT/500ML-% IV SOLN
INTRAVENOUS | Status: DC | PRN
  Administered 2023-01-19 (×2): 500 mL

## 2023-01-19 SURGICAL SUPPLY — 13 items
BAND CMPR LRG ZPHR (HEMOSTASIS) ×1
BAND ZEPHYR COMPRESS 30 LONG (HEMOSTASIS) IMPLANT
CATH BALLN WEDGE 5F 110CM (CATHETERS) IMPLANT
CATH INFINITI 5FR MULTPACK ANG (CATHETERS) IMPLANT
ELECT DEFIB PAD ADLT CADENCE (PAD) IMPLANT
GLIDESHEATH SLEND SS 6F .021 (SHEATH) IMPLANT
GUIDEWIRE INQWIRE 1.5J.035X260 (WIRE) IMPLANT
INQWIRE 1.5J .035X260CM (WIRE) ×1
KIT HEART LEFT (KITS) ×1 IMPLANT
PACK CARDIAC CATHETERIZATION (CUSTOM PROCEDURE TRAY) ×1 IMPLANT
SHEATH GLIDE SLENDER 4/5FR (SHEATH) IMPLANT
TRANSDUCER W/STOPCOCK (MISCELLANEOUS) ×1 IMPLANT
TUBING CIL FLEX 10 FLL-RA (TUBING) ×1 IMPLANT

## 2023-01-19 NOTE — Discharge Instructions (Addendum)
Add Jardiance 10 mg daily  Drink plenty of fluids for 48 hours and keep wrist elevated at heart level for 24 hours  Radial Site Care   This sheet gives you information about how to care for yourself after your procedure. Your health care provider may also give you more specific instructions. If you have problems or questions, contact your health care provider. What can I expect after the procedure? After the procedure, it is common to have: Bruising and tenderness at the catheter insertion area. Follow these instructions at home: Medicines Take over-the-counter and prescription medicines only as told by your health care provider. Insertion site care Follow instructions from your health care provider about how to take care of your insertion site. Make sure you: Wash your hands with soap and water before you change your bandage (dressing). If soap and water are not available, use hand sanitizer. Remove your dressing as told by your health care provider. In 24 hours Check your insertion site every day for signs of infection. Check for: Redness, swelling, or pain. Fluid or blood. Pus or a bad smell. Warmth. Do not take baths, swim, or use a hot tub until your health care provider approves. You may shower 24-48 hours after the procedure, or as directed by your health care provider. Remove the dressing and gently wash the site with plain soap and water. Pat the area dry with a clean towel. Do not rub the site. That could cause bleeding. Do not apply powder or lotion to the site. Activity   For 24 hours after the procedure, or as directed by your health care provider: Do not flex or bend the affected arm. Do not push or pull heavy objects with the affected arm. Do not drive yourself home from the hospital or clinic. You may drive 24 hours after the procedure unless your health care provider tells you not to. Do not operate machinery or power tools. Do not lift anything that is heavier  than 10 lb (4.5 kg), or the limit that you are told, until your health care provider says that it is safe.  For 4 days Ask your health care provider when it is okay to: Return to work or school. Resume usual physical activities or sports. Resume sexual activity. General instructions If the catheter site starts to bleed, raise your arm and put firm pressure on the site. If the bleeding does not stop, get help right away. This is a medical emergency. If you went home on the same day as your procedure, a responsible adult should be with you for the first 24 hours after you arrive home. Keep all follow-up visits as told by your health care provider. This is important. Contact a health care provider if: You have a fever. You have redness, swelling, or yellow drainage around your insertion site. Get help right away if: You have unusual pain at the radial site. The catheter insertion area swells very fast. The insertion area is bleeding, and the bleeding does not stop when you hold steady pressure on the area. Your arm or hand becomes pale, cool, tingly, or numb. These symptoms may represent a serious problem that is an emergency. Do not wait to see if the symptoms will go away. Get medical help right away. Call your local emergency services (911 in the [place] Do not drive yourself to the hospital. Summary After the procedure, it is common to have bruising and tenderness at the site. Follow instructions from your health care provider about  how to take care of your radial site wound. Check the wound every day for signs of infection. Do not lift anything that is heavier than 10 lb (4.5 kg), or the limit that you are told, until your health care provider says that it is safe. This information is not intended to replace advice given to you by your health care provider. Make sure you discuss any questions you have with your health care provider. Document Revised: 09/05/2017 Document Reviewed:  09/05/2017 Elsevier Patient Education  2020 [employer].

## 2023-01-19 NOTE — Interval H&P Note (Signed)
History and Physical Interval Note:  01/19/2023 7:06 AM  Jason Maldonado  has presented today for surgery, with the diagnosis of low EF.  The various methods of treatment have been discussed with the patient and family. After consideration of risks, benefits and other options for treatment, the patient has consented to  Procedure(s): RIGHT/LEFT HEART CATH AND CORONARY ANGIOGRAPHY (N/A) as a surgical intervention.  The patient's history has been reviewed, patient examined, no change in status, stable for surgery.  I have reviewed the patient's chart and labs.  Questions were answered to the patient's satisfaction.   Cath Lab Visit (complete for each Cath Lab visit)  Clinical Evaluation Leading to the Procedure:   ACS: No.  Non-ACS:    Anginal Classification: CCS I  Anti-ischemic medical therapy: Minimal Therapy (1 class of medications)  Non-Invasive Test Results: No non-invasive testing performed  Prior CABG: No previous CABG        Theron Arista [facility] 01/19/2023 7:06 AM

## 2023-01-22 ENCOUNTER — Encounter (HOSPITAL_COMMUNITY): Payer: Self-pay | Admitting: Cardiology

## 2023-01-22 MED FILL — Verapamil HCl IV Soln 2.5 MG/ML: INTRAVENOUS | Qty: 2 | Status: AC

## 2023-01-30 ENCOUNTER — Telehealth: Payer: Self-pay

## 2023-01-30 NOTE — Telephone Encounter (Signed)
Spoke with patient, patient very adamant about not rescheduling to an earlier appointment. Patient educated about the possibility of deadly arrhythmia but states he feels " great" and thinks too many of his providers is "jumping the gun on all these extra things not needed". Patient states he just wants to keep his July appointment on the 19th.  Mealor, Roberts Gaudy, MD  Festus Holts, RN Need to get his visit moved up ASAP to discuss ICD placement.

## 2023-01-31 ENCOUNTER — Encounter (HOSPITAL_COMMUNITY): Payer: Self-pay

## 2023-01-31 ENCOUNTER — Ambulatory Visit (HOSPITAL_COMMUNITY): Admit: 2023-01-31 | Payer: BC Managed Care – PPO | Admitting: Cardiovascular Disease

## 2023-01-31 SURGERY — V TACH ABLATION
Anesthesia: General

## 2023-02-06 ENCOUNTER — Ambulatory Visit: Payer: BC Managed Care – PPO | Attending: Nurse Practitioner | Admitting: Nurse Practitioner

## 2023-02-06 ENCOUNTER — Encounter: Payer: Self-pay | Admitting: Nurse Practitioner

## 2023-02-06 VITALS — BP 126/68 | HR 80 | Ht 67.0 in | Wt 149.6 lb

## 2023-02-06 DIAGNOSIS — R000 Tachycardia, unspecified: Secondary | ICD-10-CM

## 2023-02-06 DIAGNOSIS — Z720 Tobacco use: Secondary | ICD-10-CM

## 2023-02-06 DIAGNOSIS — I2510 Atherosclerotic heart disease of native coronary artery without angina pectoris: Secondary | ICD-10-CM | POA: Diagnosis not present

## 2023-02-06 DIAGNOSIS — E785 Hyperlipidemia, unspecified: Secondary | ICD-10-CM

## 2023-02-06 DIAGNOSIS — I519 Heart disease, unspecified: Secondary | ICD-10-CM

## 2023-02-06 DIAGNOSIS — R002 Palpitations: Secondary | ICD-10-CM | POA: Diagnosis not present

## 2023-02-06 DIAGNOSIS — I255 Ischemic cardiomyopathy: Secondary | ICD-10-CM | POA: Diagnosis not present

## 2023-02-06 DIAGNOSIS — Z789 Other specified health status: Secondary | ICD-10-CM

## 2023-02-06 DIAGNOSIS — I480 Paroxysmal atrial fibrillation: Secondary | ICD-10-CM | POA: Diagnosis not present

## 2023-02-06 NOTE — Patient Instructions (Addendum)
Medication Instructions:  Your physician recommends that you continue on your current medications as directed. Please refer to the Current Medication list given to you today.  *If you need a refill on your cardiac medications before your next appointment, please call your pharmacy*   Lab Work: BMET today  Testing/Procedures: NONE ordered at this time of appointment    Follow-Up: At [facility], you and your health needs are our priority.  As part of our continuing mission to provide you with exceptional heart care, we have created designated Provider Care Teams.  These Care Teams include your primary Cardiologist (physician) and Advanced Practice Providers (APPs -  Physician Assistants and Nurse Practitioners) who all work together to provide you with the care you need, when you need it.  We recommend signing up for the patient portal called "MyChart".  Sign up information is provided on this After Visit Summary.  MyChart is used to connect with patients for Virtual Visits (Telemedicine).  Patients are able to view lab/test results, encounter notes, upcoming appointments, etc.  Non-urgent messages can be sent to your provider as well.   To learn more about what you can do with MyChart, go to [email or web address].    Your next appointment:   3-4 month(s) Pharm D- next available  Provider:   Bernadene Person, NP        Other Instructions Referral sent to Pharm D

## 2023-02-06 NOTE — Progress Notes (Signed)
Office Visit    Patient Name: Jason Maldonado Date of Encounter: 02/06/2023  Primary Care Provider:  Patient, No Pcp Per Primary Cardiologist:  Peter Swaziland, MD  Chief Complaint    67 year old male with a history of CAD s/p DES-RCA in 2014, DES-p-mRCA in 2016 (ISR), LV dysfunction/ICM, paroxysmal atrial fibrillation, VT, hyperlipidemia, bipolar disorder and tobacco use who presents for follow-up related to CAD and palpitations.   Past Medical History    Past Medical History:  Diagnosis Date   Bipolar 1 disorder (HCC)    CAD (coronary artery disease)    a. STEMI 09/2012  single vessel occlusive CAD of the RCA s/p DES to mid RCA, EF 45%;  b. inferior STEMI 03/10/2015 DES to prox and mid RCA   Depression    Hyperlipidemia    LV dysfunction    a. 09/2012 Cath LV gram severe basal to midinferior wall hypokinesis, EF 45% and echo EF 55%.   PAF (paroxysmal atrial fibrillation) (HCC)    a. noted on admission 02/2015 in setting of inf stemi;  b. CHA2DS2VASc = 1.   Tobacco abuse    Past Surgical History:  Procedure Laterality Date   APPENDECTOMY     CARDIAC CATHETERIZATION N/A 03/10/2015   Procedure: Left Heart Cath and Coronary Angiography;  Surgeon: Lennette Bihari, MD;  Location: [facility] INVASIVE CV LAB;  Service: Cardiovascular;  Laterality: N/A;   CARDIAC CATHETERIZATION  03/10/2015   Procedure: Coronary Stent Intervention;  Surgeon: Lennette Bihari, MD;  Location: [facility] INVASIVE CV LAB;  Service: Cardiovascular;;   INGUINAL HERNIA REPAIR Left 05/25/2021   Procedure: OPEN LEFT INGUINAL HERNIA AND EPIGASTRIC HERNIA REPAIR;  Surgeon: Berna Bue, MD;  Location: [facility] ORS;  Service: General;  Laterality: Left;   LEFT HEART CATHETERIZATION WITH CORONARY ANGIOGRAM N/A 09/27/2012   Procedure: LEFT HEART CATHETERIZATION WITH CORONARY ANGIOGRAM;  Surgeon: [name], MD;  Location: [facility] CATH LAB;  Service: Cardiovascular;  Laterality: N/A;   RIGHT/LEFT HEART CATH AND CORONARY ANGIOGRAPHY N/A  01/19/2023   Procedure: RIGHT/LEFT HEART CATH AND CORONARY ANGIOGRAPHY;  Surgeon: Swaziland, Peter M, MD;  Location: [facility] INVASIVE CV LAB;  Service: Cardiovascular;  Laterality: N/A;   TONSILLECTOMY      Allergies  No Known Allergies   Labs/Other Studies Reviewed    The following studies were reviewed today:  Cardiac Studies & Procedures   CARDIAC CATHETERIZATION  CARDIAC CATHETERIZATION 01/19/2023  Narrative   Mid RCA to Dist RCA lesion is 60% stenosed.   Mid RCA lesion is 40% stenosed.   Prox LAD to Mid LAD lesion is 20% stenosed.   1st Diag lesion is 30% stenosed.   LV end diastolic pressure is normal.  Nonobstructive CAD. Continued patency of stents in the RCA Normal LV filling pressures. Normal right heart pressures Cardiac output is reduced. CO 3.3 L/min with index 1.84  Plan: continue to optimize CHF therapy. Suspect LV failure due to negative remodeling from prior infarcts. Now on Entresto and Coreg. Will add Jardiance. Suspect wide complex tachycardia is scar mediated from prior infarcts.  Findings Coronary Findings Diagnostic  Dominance: Right  Left Anterior Descending Prox LAD to Mid LAD lesion is 20% stenosed.  First Diagonal Branch 1st Diag lesion is 30% stenosed.  Left Circumflex Vessel is small. Vessel is angiographically normal.  Right Coronary Artery Vessel is very large. Mid RCA lesion is 40% stenosed. The lesion was previously treated . Mid RCA to Dist RCA lesion is 60% stenosed.  Intervention  No  interventions have been documented.   CARDIAC CATHETERIZATION  CARDIAC CATHETERIZATION 03/10/2015  Narrative  Prox RCA to Mid RCA lesion, 100% stenosed. There is a 0% residual stenosis post intervention. The lesion was previously treated with a stent (unknown type) .  A drug-eluting stent was placed.  There is mild left ventricular systolic dysfunction.  RPDA lesion, 50% stenosed.  1st RPLB lesion, 40% stenosed.  Dist RCA lesion, 40%  stenosed.  High risk acute inferior ST segment elevation myocardial infarction secondary to total proximal occlusion of the RCA immediately proximal to the previously placed stent with TIMI 0 flow in a very large dominant RCA.  Normal left coronary system with a normal LAD and circumflex vessel.  Difficult but successful PCI to the RCA with PTCA, ultimate insertion of a 4.038 mm Resolute DES stent postdilated to 4.41 mm proximally and 4.1 mm distally with restoration of TIMI-3 flow; 40% narrowing beyond the stented segment prior to the acute margin, 50% stenosis in the proximal PDA and 40% stenosis in the distal RCA continuation branch.  RECOMMENDATION:  Recommend DAPT indefinitely and after one year consider changing  brilinta to 60 mg twice a day per [place] trial data.  Smoking cessation is imperative.  Aggressive lipid-lowering therapy and treatment with beta blocker, ACE inhibitor and nitrate therapy.  Findings Coronary Findings Diagnostic  Dominance: Co-dominant  Right Coronary Artery The lesion was previously treated with a stent (unknown type) .  Acute Marginal Branch The vessel is small in size.  Right Posterior Descending Artery  First Right Posterolateral Branch  Intervention  Prox RCA to Mid RCA lesion PCI There is no pre-interventional antegrade distal flow (TIMI 0). Pre-stent angioplasty was performed. A drug-eluting stent was placed. The strut is apposed. Post-stent angioplasty was performed. The post-interventional distal flow is normal (TIMI 3). The intervention was successful. No complications occurred at this lesion. Supplies used: BALLN EUPHORA RX 2.25X12; BALLN EUPHORA RX 3.0X20; BALLN [place] EUPHORA RX 4.0X15; BALLN [place] EUPHORA RX 4.5X20 There is a 0% residual stenosis post intervention.     ECHOCARDIOGRAM  ECHOCARDIOGRAM COMPLETE 01/06/2023  Narrative ECHOCARDIOGRAM REPORT    Patient Name:   Jason Maldonado Date of Exam: 01/05/2023 Medical Rec #:   [id number]       Height:       67.0 in Accession #:    [id number]      Weight:       151.7 lb Date of Birth:  April 15, 1956       BSA:          1.798 m Patient Age:    66 years        BP:           170/96 mmHg Patient Gender: M               HR:           76 bpm. Exam Location:  [address]  Procedure: 2D Echo, Cardiac Doppler and Color Doppler  Indications:    R00.0 Wide-Complex Tachycardia  History:        Patient has prior history of Echocardiogram examinations, most recent 06/08/2015. CAD and Previous Myocardial Infarction, Abnormal ECG, Arrythmias:Atrial Fibrillation, Signs/Symptoms:Dizziness/Lightheadedness; Risk Factors:Dyslipidemia, Family History of Coronary Artery Disease and Current Smoker. Palpitations.  Sonographer:    Farrel Conners RDCS Referring Phys: Maurice Small  IMPRESSIONS   1. Global hypokinesis with akinesis of the inferior and inferolateral walls; overall severe LV dysfunction. 2. Left ventricular ejection fraction, by estimation, is 25  to 30%. The left ventricle has severely decreased function. The left ventricle demonstrates regional wall motion abnormalities (see scoring diagram/findings for description). The left ventricular internal cavity size was mildly dilated. There is moderate left ventricular hypertrophy. Left ventricular diastolic parameters are consistent with Grade I diastolic dysfunction (impaired relaxation). 3. Right ventricular systolic function is normal. The right ventricular size is normal. 4. Left atrial size was mildly dilated. 5. The mitral valve is normal in structure. No evidence of mitral valve regurgitation. No evidence of mitral stenosis. 6. The aortic valve is tricuspid. Aortic valve regurgitation is not visualized. Aortic valve sclerosis is present, with no evidence of aortic valve stenosis. 7. The inferior vena cava is normal in size with greater than 50% respiratory variability, suggesting right atrial pressure of 3  mmHg.  Comparison(s): No prior Echocardiogram.  FINDINGS Left Ventricle: Left ventricular ejection fraction, by estimation, is 25 to 30%. The left ventricle has severely decreased function. The left ventricle demonstrates regional wall motion abnormalities. The left ventricular internal cavity size was mildly dilated. There is moderate left ventricular hypertrophy. Left ventricular diastolic parameters are consistent with Grade I diastolic dysfunction (impaired relaxation).  Right Ventricle: The right ventricular size is normal. Right ventricular systolic function is normal.  Left Atrium: Left atrial size was mildly dilated.  Right Atrium: Right atrial size was normal in size.  Pericardium: There is no evidence of pericardial effusion.  Mitral Valve: The mitral valve is normal in structure. No evidence of mitral valve regurgitation. No evidence of mitral valve stenosis.  Tricuspid Valve: The tricuspid valve is normal in structure. Tricuspid valve regurgitation is trivial. No evidence of tricuspid stenosis.  Aortic Valve: The aortic valve is tricuspid. Aortic valve regurgitation is not visualized. Aortic valve sclerosis is present, with no evidence of aortic valve stenosis.  Pulmonic Valve: The pulmonic valve was normal in structure. Pulmonic valve regurgitation is trivial. No evidence of pulmonic stenosis.  Aorta: The aortic root is normal in size and structure.  Venous: The inferior vena cava is normal in size with greater than 50% respiratory variability, suggesting right atrial pressure of 3 mmHg.  IAS/Shunts: No atrial level shunt detected by color flow Doppler.  Additional Comments: Global hypokinesis with akinesis of the inferior and inferolateral walls; overall severe LV dysfunction.   LEFT VENTRICLE PLAX 2D LVIDd:         5.10 cm   Diastology LVIDs:         3.95 cm   LV e' medial:    7.72 cm/s LV PW:         0.85 cm   LV E/e' medial:  6.8 LV IVS:        1.05 cm   LV e'  lateral:   7.94 cm/s LVOT diam:     2.40 cm   LV E/e' lateral: 6.6 LV SV:         63 LV SV Index:   35 LVOT Area:     4.52 cm   RIGHT VENTRICLE RV Basal diam:  3.90 cm RV S prime:     14.80 cm/s TAPSE (M-mode): 1.7 cm  LEFT ATRIUM             Index        RIGHT ATRIUM           Index LA diam:        4.15 cm 2.31 cm/m   RA Pressure: 3.00 mmHg LA Vol (A2C):   75.8 ml 42.16 ml/m  RA  Area:     17.80 cm LA Vol (A4C):   65.4 ml 36.37 ml/m  RA Volume:   54.30 ml  30.20 ml/m LA Biplane Vol: 70.7 ml 39.32 ml/m AORTIC VALVE LVOT Vmax:   65.25 cm/s LVOT Vmean:  41.900 cm/s LVOT VTI:    0.140 m  AORTA Ao Root diam: 3.30 cm Ao Asc diam:  3.70 cm  MITRAL VALVE               TRICUSPID VALVE MV Area (PHT): cm         Estimated RAP:  3.00 mmHg MV Decel Time: 212 msec MV E velocity: 52.65 cm/s  SHUNTS MV A velocity: 77.60 cm/s  Systemic VTI:  0.14 m MV E/A ratio:  0.68        Systemic Diam: 2.40 cm  Olga Millers MD Electronically signed by Olga Millers MD Signature Date/Time: 01/06/2023/1:50:41 PM    Final    MONITORS  LONG TERM MONITOR (3-14 DAYS) 10/30/2022  Narrative   Normal sinus rhythm   Frequent and sustained runs of wide complex tachycardia most c/w ventricular tachycardia. longest lasting 5 hours 47 minutes. Patient symptoms correlate with this.   Rare brief runs of SVT.   Patch Wear Time:  13 days and 23 hours (2024-02-17T16:17:09-498 to 2024-03-02T16:16:58-0500)  Patient had a min HR of 40 bpm, max HR of 210 bpm, and avg HR of 87 bpm. Predominant underlying rhythm was Sinus Rhythm. First Degree AV Block was present. 274 Ventricular Tachycardia runs occurred, the run with the fastest interval lasting 5 hours 47 mins with a max rate of 210 bpm (avg 161 bpm); the run with the fastest interval was also the longest. 5 Supraventricular Tachycardia runs occurred, the run with the fastest interval lasting 11 beats with a max rate of 156 bpm (avg 138 bpm); the run  with the fastest interval was also the longest. 1 episode(s) of AV Block (2nd) occurred, lasting a total of 2 secs. Ventricular Tachycardia was detected within +/- 45 seconds of symptomatic patient event(s). Isolated SVEs were frequent (8.3%, [id number]), SVE Couplets were rare (<1.0%, 508), and SVE Triplets were rare (<1.0%, 546). Isolated VEs were occasional (1.8%, [id number]), VE Couplets were rare (<1.0%, 16), and VE Triplets were rare (<1.0%, 4). Ventricular Bigeminy and Trigeminy were present. MD notification criteria for Ventricular Tachycardia met - notified Karrie Doffing on 30 Oct 2022 at 10:03 am CT (MD).          Recent Labs: 04/25/2022: ALT 13 09/25/2022: Magnesium 2.0; TSH 1.690 01/17/2023: BUN 17; Creatinine, Ser 1.28; Platelets 221 01/19/2023: Hemoglobin 15.6; Potassium 4.1; Sodium 138  Recent Lipid Panel    Component Value Date/Time   CHOL 166 01/17/2023 1140   TRIG 91 01/17/2023 1140   HDL 55 01/17/2023 1140   CHOLHDL 3.0 01/17/2023 1140   CHOLHDL 3.5 11/20/2018 0804   VLDL 18 03/14/2017 0906   LDLCALC 94 01/17/2023 1140   LDLCALC 95 11/20/2018 0804    History of Present Illness    67 year old male with the above past medical history including  CAD s/p DES-RCA in 2014, DES-p-mRCA in 2016 (ISR), LV dysfunction/ICM, paroxysmal atrial fibrillation, VT, hyperlipidemia, bipolar disorder and tobacco use.   He has a history of prior inferior MI in 2014 with PCI/DES-RCA.  He was hospitalized in 02/2015 in the setting of inferior STEMI in the setting of RCA ISR.  Cardiac catheterization revealed 100% stenosis of the proximal mid RCA proximal to previously placed stent, s/p DES.  He  was noted to have 50% residual RPDA, 40% first RPL B, and 40% distal RCA stenoses.  He was started on aspirin and Brilinta.  Echo showed EF 30 to 35%, basal to mid inferior akinesis, inferoseptal and inferolateral severe hypokinesis.  Repeat echo in 05/2015 showed inferior hypokinesis with EF improved to 50 to 55%.  He  had reported brief atrial fibrillation upon presentation with his acute MI, ever, there is no documented evidence of this. He has declined PCSK9 inhibitors in the past, and is tolerating Livalo and Nexlizet.  He contacted our office on 09/25/2022 with concern for a 1 month history of a "sinking feeling in his chest," with associated nausea, palpitations, and lightheadedness.  Outpatient cardiac monitor revealed SVT, VT, first-degree AV block.  Some episodes of wide-complex tachycardia lasted longer than 5 hours.  He was referred to EP.  Echocardiogram showed EF 25 to 30%, severely decreased LV function, no RWMA, moderate LVH, G1 DD, normal RV, no significant valvular abnormalities.  He was last seen in the office on 01/17/2023 noted some fatigue, he denies any syncope or chest pain, some mild dyspnea when climbing stairs.  He underwent outpatient R/LHC on 01/19/2023 revealed nonobstructive CAD, continue patency of stents in the RCA, normal LV filling pressures, normal right heart pressures, reduced cardiac output.  Optimization of heart failure therapy was recommended it was felt that his LV failure was likely due to negative remodeling from prior infarcts.  He was started on Entresto and carvedilol.  Jardiance was also added to his medication regimen.  He was advised to follow-up with EP for consideration of ICD.   He presents today for follow-up.  Since his last visit and since his catheterization been stable from a cardiac standpoint.  He notes occasional lightheadedness.  He denies any symptoms concerning for angina, denies palpitations, presyncope, syncope, dyspnea, edema, PND, orthopnea, weight gain.  Overall, he reports feeling well.  Home Medications    Current Outpatient Medications  Medication Sig Dispense Refill   aspirin EC 81 MG EC tablet Take 1 tablet (81 mg total) by mouth daily.     buPROPion (WELLBUTRIN) 75 MG tablet TAKE 1 TABLET BY MOUTH EVERY DAY 90 tablet 1   carvedilol (COREG) 6.25 MG  tablet Take 1 tablet (6.25 mg total) by mouth 2 (two) times daily. 180 tablet 3   empagliflozin (JARDIANCE) 10 MG TABS tablet Take 1 tablet (10 mg total) by mouth daily before breakfast. 30 tablet 11   ibuprofen (ADVIL) 200 MG tablet Take 200 mg by mouth daily as needed (pain).     LIVALO 2 MG TABS TAKE 1 TABLET BY MOUTH EVERY DAY 90 tablet 3   loratadine (CLARITIN) 10 MG tablet Take 10 mg by mouth daily as needed for allergies.     NEXLIZET 180-10 MG TABS TAKE 1 TABLET BY MOUTH EVERY DAY 90 tablet 3   nitroGLYCERIN (NITROSTAT) 0.4 MG SL tablet Take as needed for chest pain. 1 tablet under tongue every 5 mins up to 3 tablets.After 3 tablets if chest pain call 911. 25 tablet 11   sacubitril-valsartan (ENTRESTO) 24-26 MG Take 1 tablet by mouth 2 (two) times daily. 60 tablet 11   sertraline (ZOLOFT) 50 MG tablet TAKE 1 TABLET BY MOUTH EVERY DAY 30 tablet 0   ticagrelor (BRILINTA) 60 MG TABS tablet Take 1 tablet (60 mg total) by mouth 2 (two) times daily. 90 tablet 3   sacubitril-valsartan (ENTRESTO) 24-26 MG Take 1 tablet by mouth 2 (two) times daily. (Patient  not taking: Reported on 02/06/2023) 180 tablet 3   No current facility-administered medications for this visit.     Review of Systems    He denies chest pain, palpitations, dyspnea, pnd, orthopnea, n, v, dizziness, syncope, edema, weight gain, or early satiety. All other systems reviewed and are otherwise negative except as noted above.   Physical Exam    VS:  BP 126/68 (BP Location: Left Arm, Patient Position: Sitting, Cuff Size: Normal)   Pulse 80   Ht 5\' 7"  (1.702 m)   Wt 149 lb 9.6 oz (67.9 kg)   SpO2 97%   BMI 23.43 kg/m  GEN: Well nourished, well developed, in no acute distress. HEENT: normal. Neck: Supple, no JVD, carotid bruits, or masses. Cardiac: RRR, no murmurs, rubs, or gallops. No clubbing, cyanosis, edema.  Radials/DP/PT 2+ and equal bilaterally.  Respiratory:  Respirations regular and unlabored, clear to auscultation  bilaterally. GI: Soft, nontender, nondistended, BS + x 4. MS: no deformity or atrophy. Skin: warm and dry, no rash. Neuro:  Strength and sensation are intact. Psych: Normal affect.  Accessory Clinical Findings    ECG personally reviewed by me today -   sinus rhythm, 80 bpm, sinus arrhythmia, PVC- no acute changes.   Lab Results  Component Value Date   WBC 8.2 01/17/2023   HGB 15.6 01/19/2023   HCT 46.0 01/19/2023   MCV 81 01/17/2023   PLT 221 01/17/2023   Lab Results  Component Value Date   CREATININE 1.28 (H) 01/17/2023   BUN 17 01/17/2023   NA 138 01/19/2023   K 4.1 01/19/2023   CL 104 01/17/2023   CO2 27 01/17/2023   Lab Results  Component Value Date   ALT 13 04/25/2022   AST 17 04/25/2022   ALKPHOS 39 (L) 04/25/2022   BILITOT 0.4 04/25/2022   Lab Results  Component Value Date   CHOL 166 01/17/2023   HDL 55 01/17/2023   LDLCALC 94 01/17/2023   TRIG 91 01/17/2023   CHOLHDL 3.0 01/17/2023    Lab Results  Component Value Date   HGBA1C 5.7 (H) 09/27/2012    Assessment & Plan    1. CAD:  S/p DES-RCA in 2014, DES-p-mRCA in 2016 (ISR). R/LHC on 01/19/2023 revealed nonobstructive CAD, continue patency of stents in the RCA, normal LV filling pressures, normal right heart pressures, reduced cardiac output.  Stable with no anginal symptoms.  Continue aspirin, Brilinta, carvedilol, Entresto, [place], [place], and Nexlizet.    2. LV dysfunction/ICM/wide complex tachycardia: Prior EF 30 to 35%, improved to 50 to 55%.  Outpatient cardiac monitor in 10/2022 revealed SVT, VT, first-degree AV block.  Some episodes of wide-complex tachycardia lasted longer than 5 hours.  He denies any recent symptoms. EF decreased to 25 to 30% on most recent echo in 12/2022.   Euvolemic and well compensated on exam.  Following with EP for consideration of ICD. Recently started on Entresto, carvedilol, and Jardiance.  If BP remains stable, consider titration of GDMT at next follow-up visit.  Will  update BMET.  Discussed ED precautions.  For now, continue current medications as above.   3. Paroxysmal atrial fibrillation: Reported history atrial fibrillation at the time of his MI in 2016, though there is no EKG documentation of this.  Not on anticoagulation.  Continue carvedilol.   4. Hyperlipidemia/statin intolerance: LDL was 69 in 04/2022.  He notes arthralgias/myalgias with Livalo.  Will schedule follow-up with lipid clinic Pharm.D. to discuss alternative lipid-lowering therapy.  For now, continue Livalo, Nexlizet.  5. Tobacco use: He continues to smoke.  Full cessation advised.   6. Disposition: Follow-up as scheduled with EP, sooner if needed, follow-up in 3-4 months with general cardiology APP.      Joylene Grapes, NP 02/06/2023, 5:49 PM

## 2023-02-07 LAB — BASIC METABOLIC PANEL
BUN/Creatinine Ratio: 16 (ref 10–24)
BUN: 23 mg/dL (ref 8–27)
CO2: 21 mmol/L (ref 20–29)
Calcium: 9.9 mg/dL (ref 8.6–10.2)
Chloride: 101 mmol/L (ref 96–106)
Creatinine, Ser: 1.46 mg/dL — ABNORMAL HIGH (ref 0.76–1.27)
Glucose: 77 mg/dL (ref 70–99)
Potassium: 4.4 mmol/L (ref 3.5–5.2)
Sodium: 140 mmol/L (ref 134–144)
eGFR: 52 mL/min/{1.73_m2} — ABNORMAL LOW (ref >59)

## 2023-02-09 ENCOUNTER — Other Ambulatory Visit: Payer: Self-pay | Admitting: Physician Assistant

## 2023-02-09 ENCOUNTER — Telehealth: Payer: Self-pay

## 2023-02-09 NOTE — Telephone Encounter (Signed)
Spoke with pt. Pt was notified of lab results. Pt will continue current medication and f/u as planned.  

## 2023-03-01 NOTE — Progress Notes (Signed)
Electrophysiology Office Note:    Date:  03/02/2023   ID:  Jason Maldonado, DOB [birth year], MRN [id number]  PCP:  Patient, No Pcp Per    HeartCare Providers Cardiologist:  Peter Swaziland, MD Electrophysiologist:  Maurice Small, MD     Referring MD: No ref. provider found   History of Present Illness:    Jason Maldonado is a 67 y.o. male with a hx listed below, significant for CAD s/p inferior MI, PAF, hyperlipidemia referred for arrhythmia management.  He contacted our office in February complaining of a "sinking feeling", dizziness, lightheadedness associated with palpitations that began in January.  A 14-day monitor was placed that showed episodes of wide-complex tachycardia, some quite long.  At our last visit, EP study was discussed to assess wide complex tachcyardia associated with pre-sycope. Follow-up TTE showed recurrence of CHFrEF, EF25-30%. He was referred back to his cardiologist, Dr. Swaziland for management. Cath showed non-obstructive CAD. GDMT was started. EP study was cancelled with recurrence of cardiomyopathy.  He has not had syncope, pre-syncope, chest pain.   EKGs/Labs/Other Studies Reviewed Today:    Echocardiogram:  TTE 06/07/2025 EF 50-55%, inferior and inferoseptal hypokinesis  TTE 01/06/2023 EF 25-30%, Gr I diastolic dysfunction; mildly dilated LA  Monitors:  Zio Patch XT - 14 days 09/2022 My interpretation Sins rhythm HR 47-111, avg 76 Multiple runs of tachycardia with QRS wider than that of sinus rhythm PVC burden is 8%  Stress testing:   Advanced imaging:  Coronary angiogram: 01/19/2023 Reviewed in Epic - non-obstructive CAD   EKG:  Last EKG results: today -- sinus rhythm with frequent pleiomorphic PVCs   Recent Labs: 04/25/2022: ALT 13 09/25/2022: Magnesium 2.0; TSH 1.690 01/17/2023: Platelets 221 01/19/2023: Hemoglobin 15.6 02/06/2023: BUN 23; Creatinine, Ser 1.46; Potassium 4.4; Sodium 140     Physical Exam:    VS:  BP (!)  160/98   Pulse 79   Ht 5\' 7"  (1.702 m)   Wt 151 lb (68.5 kg)   SpO2 98%   BMI 23.65 kg/m     Wt Readings from Last 3 Encounters:  03/02/23 151 lb (68.5 kg)  02/06/23 149 lb 9.6 oz (67.9 kg)  01/19/23 151 lb (68.5 kg)     GEN:  Well nourished, well developed in no acute distress CARDIAC: RRR, no murmurs, rubs, gallops RESPIRATORY:  Normal work of breathing MUSCULOSKELETAL: no edema    ASSESSMENT & PLAN:    Wide complex tachycardia Presumed VT with cardiomyopathy; possibly rapid AF with aberrancy continue carvedilol to 6.5mg  We discussed lifeVest. He declined.  Recurrent ischemic cardiomyopathy At time of STEMI; E now < 30%, still with inferior WMA Continue jardiance 10, entresto 24-26, coreg 6.25 Re-evaluate EF at 90 day mark of GDMT for need for ICD   Frequent Pleiomorphic PVCs Continue carvedilol. He is not particularly symptomatic  Paroxysmal atrial fibrillation Per chart history. I was not able to locate ECGs  CAD  S/p inferior STEMI 03/10/2015  Hypertension contnue carvedilol, entresto I advised getting a home monitor He is having a lot of joint pain presently likely contributing          Medication Adjustments/Labs and Tests Ordered: Current medicines are reviewed at length with the patient today.  Concerns regarding medicines are outlined above.  Orders Placed This Encounter  Procedures   EKG 12-Lead   No orders of the defined types were placed in this encounter.    Signed, Maurice Small, MD  03/02/2023 4:10 PM    [facility]

## 2023-03-02 ENCOUNTER — Encounter: Payer: Self-pay | Admitting: Cardiovascular Disease

## 2023-03-02 ENCOUNTER — Ambulatory Visit: Payer: BC Managed Care – PPO | Attending: Cardiovascular Disease | Admitting: Cardiovascular Disease

## 2023-03-02 VITALS — BP 160/98 | HR 79 | Ht 67.0 in | Wt 151.0 lb

## 2023-03-02 DIAGNOSIS — I2510 Atherosclerotic heart disease of native coronary artery without angina pectoris: Secondary | ICD-10-CM | POA: Diagnosis not present

## 2023-03-02 NOTE — Patient Instructions (Signed)
Medication Instructions:  Your physician recommends that you continue on your current medications as directed. Please refer to the Current Medication list given to you today. *If you need a refill on your cardiac medications before your next appointment, please call your pharmacy*   Testing/Procedures: Echocardiogram - On or after 04/21/23  Your physician has requested that you have an echocardiogram. Echocardiography is a painless test that uses sound waves to create images of your heart. It provides your doctor with information about the size and shape of your heart and how well your heart's chambers and valves are working. This procedure takes approximately one hour. There are no restrictions for this procedure. Please do NOT wear cologne, perfume, aftershave, or lotions (deodorant is allowed). Please arrive 15 minutes prior to your appointment time.   Follow-Up: At [facility], you and your health needs are our priority.  As part of our continuing mission to provide you with exceptional heart care, we have created designated Provider Care Teams.  These Care Teams include your primary Cardiologist (physician) and Advanced Practice Providers (APPs -  Physician Assistants and Nurse Practitioners) who all work together to provide you with the care you need, when you need it.  We recommend signing up for the patient portal called "MyChart".  Sign up information is provided on this After Visit Summary.  MyChart is used to connect with patients for Virtual Visits (Telemedicine).  Patients are able to view lab/test results, encounter notes, upcoming appointments, etc.  Non-urgent messages can be sent to your provider as well.   To learn more about what you can do with MyChart, go to [email or web address].    Your next appointment:   2-3 weeks after echo  Provider:   York Pellant, MD

## 2023-03-06 ENCOUNTER — Encounter: Payer: Self-pay | Admitting: Pharmacist

## 2023-03-06 ENCOUNTER — Ambulatory Visit: Payer: BC Managed Care – PPO | Attending: Cardiovascular Disease | Admitting: Pharmacist

## 2023-03-06 VITALS — BP 138/86 | HR 86

## 2023-03-06 DIAGNOSIS — I2119 ST elevation (STEMI) myocardial infarction involving other coronary artery of inferior wall: Secondary | ICD-10-CM | POA: Diagnosis not present

## 2023-03-06 DIAGNOSIS — I2510 Atherosclerotic heart disease of native coronary artery without angina pectoris: Secondary | ICD-10-CM | POA: Diagnosis not present

## 2023-03-06 DIAGNOSIS — G720 Drug-induced myopathy: Secondary | ICD-10-CM | POA: Diagnosis not present

## 2023-03-06 DIAGNOSIS — E785 Hyperlipidemia, unspecified: Secondary | ICD-10-CM | POA: Diagnosis not present

## 2023-03-06 DIAGNOSIS — T466X5A Adverse effect of antihyperlipidemic and antiarteriosclerotic drugs, initial encounter: Secondary | ICD-10-CM

## 2023-03-06 NOTE — Progress Notes (Signed)
Patient ID: Jason Maldonado                 DOB: 1955-11-10                    MRN: [id number]     HPI: Jason Maldonado is a 67 y.o. male patient referred to lipid clinic by Bernadene Person. PMH is significant for CAD, STEMI, PAF, CHF, bipolar 1, HLD, and smoking.   Patient presents today complaining of shoulder pain. [place]. Was able to tolerate name brand Livalo until insurance would no longer cover and is now taking generic pitavastatin. Discontinued 3 days ago due to joint pain. Also discontinued Nexlizet since he thought they needed to be taken together.  Reports last lipid panel he had drawn was not fasting.  Current Medications:  Nexlizet 180-10 (not currently taking)  Intolerances:  Atorvastatin Rosuvastatin Pitavastatin  Risk Factors:  CAD Smoking STEMI CHF  LDL goal: <70  Labs: TC 166, Trigs 91, HDL 55, LDL 94 (01/17/23)  Past Medical History:  Diagnosis Date   Bipolar 1 disorder (HCC)    CAD (coronary artery disease)    a. STEMI 09/2012  single vessel occlusive CAD of the RCA s/p DES to mid RCA, EF 45%;  b. inferior STEMI 03/10/2015 DES to prox and mid RCA   Depression    Hyperlipidemia    LV dysfunction    a. 09/2012 Cath LV gram severe basal to midinferior wall hypokinesis, EF 45% and echo EF 55%.   PAF (paroxysmal atrial fibrillation) (HCC)    a. noted on admission 02/2015 in setting of inf stemi;  b. CHA2DS2VASc = 1.   Tobacco abuse     Current Outpatient Medications on File Prior to Visit  Medication Sig Dispense Refill   aspirin EC 81 MG EC tablet Take 1 tablet (81 mg total) by mouth daily.     buPROPion (WELLBUTRIN) 75 MG tablet TAKE 1 TABLET BY MOUTH EVERY DAY 90 tablet 1   carvedilol (COREG) 6.25 MG tablet Take 1 tablet (6.25 mg total) by mouth 2 (two) times daily. 180 tablet 3   empagliflozin (JARDIANCE) 10 MG TABS tablet Take 1 tablet (10 mg total) by mouth daily before breakfast. 30 tablet 11   ibuprofen (ADVIL) 200 MG tablet Take  200 mg by mouth daily as needed (pain).     LIVALO 2 MG TABS TAKE 1 TABLET BY MOUTH EVERY DAY 90 tablet 3   loratadine (CLARITIN) 10 MG tablet Take 10 mg by mouth daily as needed for allergies.     NEXLIZET 180-10 MG TABS TAKE 1 TABLET BY MOUTH EVERY DAY 90 tablet 3   nitroGLYCERIN (NITROSTAT) 0.4 MG SL tablet Take as needed for chest pain. 1 tablet under tongue every 5 mins up to 3 tablets.After 3 tablets if chest pain call 911. 25 tablet 11   sacubitril-valsartan (ENTRESTO) 24-26 MG Take 1 tablet by mouth 2 (two) times daily. 180 tablet 3   sertraline (ZOLOFT) 50 MG tablet TAKE 1 TABLET BY MOUTH EVERY DAY (Patient taking differently: Take 25 mg by mouth daily.) 30 tablet 0   ticagrelor (BRILINTA) 60 MG TABS tablet Take 1 tablet (60 mg total) by mouth 2 (two) times daily. 90 tablet 3   No current facility-administered medications on file prior to visit.    No Known Allergies  Assessment/Plan:  1. Hyperlipidemia - Patient's last LDL 94 which is above goal of <70. Patient not fasting at  the time. Recommended discontinuing pitavastatin since it is causing him to have shoulder pain that is interfering with his work. Patient unaware he could take Nexlizet without a statin. Recommended restarting Nexlizet once daily and recheck a fasting lipid panel in 3 months. Patient in agreement with plan.  Stop pitavastatin Restart Nexlizet 180-10mg  once daily Recheck lipid and hepatic panel in 3 months  Laural Golden, PharmD, BCACP, CDCES, CPP [address] [place], [place], [address] Phone: [phone], Fax: [phone]

## 2023-03-06 NOTE — Patient Instructions (Addendum)
It was nice meeting you today  We would like your LDL to be less than 70  Since you are having joint pains with the pitavastatin, continue not taking it and just take your Nexlizet 180-10mg   Let's recheck your lipid panel in about 3 months  Laural Golden, PharmD, BCACP, CDCES, CPP [address], [address] [place], [place], [address] Phone: [phone], Fax: [phone]

## 2023-03-11 ENCOUNTER — Other Ambulatory Visit: Payer: Self-pay | Admitting: Physician Assistant

## 2023-04-09 ENCOUNTER — Other Ambulatory Visit: Payer: Self-pay | Admitting: Physician Assistant

## 2023-04-26 ENCOUNTER — Ambulatory Visit (HOSPITAL_COMMUNITY): Payer: BC Managed Care – PPO | Attending: Internal Medicine

## 2023-04-26 DIAGNOSIS — I2510 Atherosclerotic heart disease of native coronary artery without angina pectoris: Secondary | ICD-10-CM | POA: Insufficient documentation

## 2023-04-29 LAB — ECHOCARDIOGRAM COMPLETE
AR max vel: 2.93 cm2
AV Area VTI: 2.65 cm2
AV Area mean vel: 2.73 cm2
AV Mean grad: 2 mmHg
AV Peak grad: 4 mmHg
Ao pk vel: 1 m/s
Area-P 1/2: 3.56 cm2
S' Lateral: 3.87 cm

## 2023-05-08 ENCOUNTER — Ambulatory Visit: Payer: BC Managed Care – PPO | Admitting: Nurse Practitioner

## 2023-05-08 DIAGNOSIS — I2119 ST elevation (STEMI) myocardial infarction involving other coronary artery of inferior wall: Secondary | ICD-10-CM | POA: Diagnosis not present

## 2023-05-08 DIAGNOSIS — I2510 Atherosclerotic heart disease of native coronary artery without angina pectoris: Secondary | ICD-10-CM | POA: Diagnosis not present

## 2023-05-08 DIAGNOSIS — E785 Hyperlipidemia, unspecified: Secondary | ICD-10-CM | POA: Diagnosis not present

## 2023-05-09 ENCOUNTER — Other Ambulatory Visit: Payer: Self-pay

## 2023-05-09 LAB — LIPID PANEL
Chol/HDL Ratio: 3.7 ratio (ref 0.0–5.0)
Cholesterol, Total: 182 mg/dL (ref 100–199)
HDL: 49 mg/dL (ref >39)
LDL Chol Calc (NIH): 114 mg/dL — ABNORMAL HIGH (ref 0–99)
Triglycerides: 105 mg/dL (ref 0–149)
VLDL Cholesterol Cal: 19 mg/dL (ref 5–40)

## 2023-05-09 LAB — HEPATIC FUNCTION PANEL
ALT: 12 IU/L (ref 0–44)
AST: 22 IU/L (ref 0–40)
Albumin: 4.4 g/dL (ref 3.9–4.9)
Alkaline Phosphatase: 48 IU/L (ref 44–121)
Bilirubin Total: 0.4 mg/dL (ref 0.0–1.2)
Bilirubin, Direct: 0.15 mg/dL (ref 0.00–0.40)
Total Protein: 6.8 g/dL (ref 6.0–8.5)

## 2023-05-10 ENCOUNTER — Ambulatory Visit: Payer: BC Managed Care – PPO | Admitting: Cardiovascular Disease

## 2023-05-22 ENCOUNTER — Encounter: Payer: Self-pay | Admitting: Pharmacist

## 2023-05-22 ENCOUNTER — Ambulatory Visit: Payer: BC Managed Care – PPO | Attending: Cardiovascular Disease | Admitting: Pharmacist

## 2023-05-22 DIAGNOSIS — M25511 Pain in right shoulder: Secondary | ICD-10-CM | POA: Diagnosis not present

## 2023-05-22 DIAGNOSIS — M25512 Pain in left shoulder: Secondary | ICD-10-CM

## 2023-05-22 DIAGNOSIS — G720 Drug-induced myopathy: Secondary | ICD-10-CM

## 2023-05-22 DIAGNOSIS — I2119 ST elevation (STEMI) myocardial infarction involving other coronary artery of inferior wall: Secondary | ICD-10-CM

## 2023-05-22 DIAGNOSIS — T466X5D Adverse effect of antihyperlipidemic and antiarteriosclerotic drugs, subsequent encounter: Secondary | ICD-10-CM

## 2023-05-22 DIAGNOSIS — I2510 Atherosclerotic heart disease of native coronary artery without angina pectoris: Secondary | ICD-10-CM | POA: Diagnosis not present

## 2023-05-22 NOTE — Patient Instructions (Addendum)
It was good seeing you again  I am sorry to hear you are still having muscle and joint pain  We will hold off on starting the Repatha today  I recommend trying stretching exercises after work and on the weekends to help keep your muscles loose   If you would like a referral to sports medicine or orthopedics let us know   Follow up with Dr Swaziland next month  Laural Golden, PharmD, BCACP, CDCES, CPP [address] [place], [place], [address] Phone: [phone], Fax: [phone]

## 2023-05-22 NOTE — Progress Notes (Unsigned)
Patient ID: Jason Maldonado                 DOB: [birth year]                    MRN: [id number]     HPI: MUJAHID JALOMO is a 67 y.o. male patient referred to lipid clinic by Bernadene Person. Patient of Dr Swaziland PMH is significant for CAD, STEMI, PAF, CHF, bipolar 1, HLD, and smoking. Patient last seen in July when pitavastatin was discontinued and Nexlizet was restarted.  Patient previously discontinued pitavastatin due to shoulder pain. Works [place] so he uses his hands, upper arms, and shoulder joints frequently.  Restarted Nexlizet however the muscle and joint pains did not subside. He discontinued medication three weeks ago and reports it is gradually improving but still will be woken up at night with arm and shoulder pain.   LDL increased while on Nexlizet monotherapy.  Patient very frustrated regarding muscle and joint pain. Of note, also discontinued Wellbutrin and sertraline because he read they could be bad for his heart. Patient does not have a PCP.  When patient was on Lipitor in the past and had muscle pain received steroid injections at urgent care which he said was helpful.  Continues to smoke.  Current Medications:  Nexlizet 180-10 (not currently taking)  Intolerances:  Atorvastatin Rosuvastatin Pitavastatin  Risk Factors:  CAD Smoking STEMI CHF  Labs: TC 182, Trigs 105, HDL 49, LDL 114 (05/08/23 on Nexlizet)  Past Medical History:  Diagnosis Date   Bipolar 1 disorder (HCC)    CAD (coronary artery disease)    a. STEMI 09/2012  single vessel occlusive CAD of the RCA s/p DES to mid RCA, EF 45%;  b. inferior STEMI 03/10/2015 DES to prox and mid RCA   Depression    Hyperlipidemia    LV dysfunction    a. 09/2012 Cath LV gram severe basal to midinferior wall hypokinesis, EF 45% and echo EF 55%.   PAF (paroxysmal atrial fibrillation) (HCC)    a. noted on admission 02/2015 in setting of inf stemi;  b. CHA2DS2VASc = 1.   Tobacco abuse     Current  Outpatient Medications on File Prior to Visit  Medication Sig Dispense Refill   aspirin EC 81 MG EC tablet Take 1 tablet (81 mg total) by mouth daily.     buPROPion (WELLBUTRIN) 75 MG tablet TAKE 1 TABLET BY MOUTH EVERY DAY 90 tablet 1   carvedilol (COREG) 6.25 MG tablet Take 1 tablet (6.25 mg total) by mouth 2 (two) times daily. 180 tablet 3   empagliflozin (JARDIANCE) 10 MG TABS tablet Take 1 tablet (10 mg total) by mouth daily before breakfast. 30 tablet 11   ibuprofen (ADVIL) 200 MG tablet Take 200 mg by mouth daily as needed (pain).     LIVALO 2 MG TABS TAKE 1 TABLET BY MOUTH EVERY DAY 90 tablet 3   loratadine (CLARITIN) 10 MG tablet Take 10 mg by mouth daily as needed for allergies.     NEXLIZET 180-10 MG TABS TAKE 1 TABLET BY MOUTH EVERY DAY 90 tablet 3   nitroGLYCERIN (NITROSTAT) 0.4 MG SL tablet Take as needed for chest pain. 1 tablet under tongue every 5 mins up to 3 tablets.After 3 tablets if chest pain call 911. 25 tablet 11   sacubitril-valsartan (ENTRESTO) 24-26 MG Take 1 tablet by mouth 2 (two) times daily. 180 tablet 3   sertraline (ZOLOFT) 50  MG tablet TAKE 1 TABLET BY MOUTH EVERY DAY (Patient taking differently: Take 25 mg by mouth daily.) 30 tablet 0   ticagrelor (BRILINTA) 60 MG TABS tablet Take 1 tablet (60 mg total) by mouth 2 (two) times daily. 90 tablet 3   No current facility-administered medications on file prior to visit.    No Known Allergies  Assessment/Plan:  1. Hyperlipidemia - Patient's last LDL 114 which has increased since last visit. Has been off Nexlizet for approximately 3 weeks now.   When patient describes his myalgia symptoms they typically occur after work when at rest. Suggested he try stretching exercises which he was willing to do. Offered referral to orthopedics which he declined. However, patient does not have PCP so will make referral to primary care and sports medicine.   Advised next step for LDL lowering would be a PCSK9i. Trained patient on  storage, site selection, and administration. Advised there is a slight chance of muscle pain in the literature so patient declines at this time. Patient would like to see if muscle pain continues to decrease before starting a new medication. Has follow up scheduled with Dr Swaziland in 1 month. Consider starting Repatha at that time if patient willing.  Stop Nexlizet Amb referral to primary care and sports medicine F/U with Dr Swaziland in 1 month  Laural Golden, PharmD, BCACP, CDCES, CPP [address], [address] [place], [place], [address] Phone: [phone], Fax: [phone]

## 2023-05-30 ENCOUNTER — Other Ambulatory Visit: Payer: Self-pay | Admitting: Cardiology

## 2023-05-30 ENCOUNTER — Telehealth: Payer: Self-pay | Admitting: Cardiology

## 2023-05-30 ENCOUNTER — Other Ambulatory Visit: Payer: Self-pay | Admitting: Physician Assistant

## 2023-05-30 ENCOUNTER — Other Ambulatory Visit (HOSPITAL_COMMUNITY): Payer: Self-pay

## 2023-05-30 ENCOUNTER — Telehealth: Payer: Self-pay | Admitting: Pharmacy Technician

## 2023-05-30 NOTE — Telephone Encounter (Signed)
Left message for patient to return the call.

## 2023-05-30 NOTE — Telephone Encounter (Signed)
Pt c/o medication issue:  1. Name of Medication: Repatha   2. How are you currently taking this medication (dosage and times per day)?   3. Are you having a reaction (difficulty breathing--STAT)?   4. What is your medication issue? Patient is requesting call back to discuss his statin intolerance and his last office visit with Cheree Ditto, [facility]. He states that he would like to make sure Dr. Swaziland is aware of the suggestions for him and to discuss further taking repatha. Please advise.

## 2023-05-30 NOTE — Telephone Encounter (Signed)
Pt c/o medication issue:  1. Name of Medication:   LIVALO 2 MG TABS   2. How are you currently taking this medication (dosage and times per day)?   3. Are you having a reaction (difficulty breathing--STAT)?   4. What is your medication issue?   Caller Coy Saunas) stated patient wants to be put back on this medication but will need prior authorization to get this medication.  Care Service number [phone] and reference case number [id number].

## 2023-05-30 NOTE — Telephone Encounter (Addendum)
Pharmacy Patient Advocate Encounter   Received notification from Pt Calls Messages that prior authorization for livalo is required/requested.   Insurance verification completed.   The patient is insured through [facility] .   Per test claim: PA required; PA started via CoverMyMeds. KEY [id number] . Waiting for clinical questions to populate.

## 2023-05-31 ENCOUNTER — Telehealth: Payer: Self-pay

## 2023-05-31 NOTE — Telephone Encounter (Signed)
LVM to RC. Is he still taking?

## 2023-05-31 NOTE — Telephone Encounter (Signed)
Call to verify he stopped

## 2023-05-31 NOTE — Telephone Encounter (Signed)
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

## 2023-06-01 NOTE — Telephone Encounter (Signed)
Already spoke to patient.See 10/17 telephone note.

## 2023-06-05 ENCOUNTER — Encounter (HOSPITAL_BASED_OUTPATIENT_CLINIC_OR_DEPARTMENT_OTHER): Payer: Self-pay | Admitting: Family Medicine

## 2023-06-05 ENCOUNTER — Ambulatory Visit (HOSPITAL_BASED_OUTPATIENT_CLINIC_OR_DEPARTMENT_OTHER)
Admission: RE | Admit: 2023-06-05 | Discharge: 2023-06-05 | Disposition: A | Discharge status: Home / Self Care | Payer: BC Managed Care – PPO | Source: Ambulatory Visit | Attending: Family Medicine | Admitting: Family Medicine

## 2023-06-05 ENCOUNTER — Ambulatory Visit (INDEPENDENT_AMBULATORY_CARE_PROVIDER_SITE_OTHER): Payer: BC Managed Care – PPO | Admitting: Family Medicine

## 2023-06-05 VITALS — BP 152/106 | HR 80 | Ht 67.0 in | Wt 149.2 lb

## 2023-06-05 DIAGNOSIS — M19031 Primary osteoarthritis, right wrist: Secondary | ICD-10-CM | POA: Diagnosis not present

## 2023-06-05 DIAGNOSIS — M25531 Pain in right wrist: Secondary | ICD-10-CM | POA: Diagnosis not present

## 2023-06-05 DIAGNOSIS — M79631 Pain in right forearm: Secondary | ICD-10-CM | POA: Diagnosis not present

## 2023-06-05 DIAGNOSIS — M25512 Pain in left shoulder: Secondary | ICD-10-CM | POA: Insufficient documentation

## 2023-06-05 DIAGNOSIS — M25511 Pain in right shoulder: Secondary | ICD-10-CM | POA: Diagnosis not present

## 2023-06-05 DIAGNOSIS — M19012 Primary osteoarthritis, left shoulder: Secondary | ICD-10-CM | POA: Diagnosis not present

## 2023-06-05 DIAGNOSIS — M19011 Primary osteoarthritis, right shoulder: Secondary | ICD-10-CM | POA: Diagnosis not present

## 2023-06-05 DIAGNOSIS — M25539 Pain in unspecified wrist: Secondary | ICD-10-CM | POA: Insufficient documentation

## 2023-06-05 NOTE — Progress Notes (Signed)
New Patient Office Visit  Subjective    Patient ID: Jason Maldonado, male    DOB: [birth year]  Age: 67 y.o. MRN: [id number]  CC:  Chief Complaint  Patient presents with   New Patient (Initial Visit)    New patient dr Delma Officer diagnosed with statin myopathy pt has googled and he thinks he has the symptoms of SAAM.     HPI Jason Maldonado presents to establish care Last PCP - none  Patient is a 67 year old male with past medical history significant for STEMI, CAD, hyperlipidemia, BPD 1, paroxysmal AF, tobacco use.  He presents today to establish in clinic.  He has primarily been following with cardiology and has not had recent PCP.  His primary concerns are related to musculoskeletal pains following treatment with statin. Patient does follow with cardiology regularly regarding above.  Current medications as outlined below.  Bilateral shoulder pain: Patient reports history of bilateral shoulder pain which he primarily attributes to statin therapy.  He reports that several years ago he had similar issue and was seen by a sports medicine provider who ultimately performed bilateral shoulder injections with steroid and this provided significant relief for patient.  He indicates that his pain is primarily related to inflammation from statin use.  He stopped most recent statin about 1 month ago, however continues to have bilateral shoulder pain.  To a lesser degree he also has some pain in bilateral hips.  He has been working with lipid clinic through cardiology and recently had discussion with them regarding Repatha.  Patient has elected to hold off on this until current musculoskeletal symptoms are improved in case Repatha was to also cause MSK symptoms and thus he would want to be without symptoms to be able to detect this. On review, it does appear that in 2017, patient had evaluation with sports medicine provider and have x-rays completed at that time followed by subacromial bursa injection.  Right  wrist pain: Patient reports that earlier this year, he had a fall onto right outstretched hand.  He had some wrist pain at that time which gradually improved.  He indicates that this pain returned with use of statin therapy recently.  Pain is primarily over radial aspect of wrist.  Patient works at the [employer].  As a result, above issues can be impactful for his day-to-day routine if symptoms are increased with certain activities.  Outpatient Encounter Medications as of 06/05/2023  Medication Sig   aspirin EC 81 MG EC tablet Take 1 tablet (81 mg total) by mouth daily.   carvedilol (COREG) 6.25 MG tablet Take 1 tablet (6.25 mg total) by mouth 2 (two) times daily.   empagliflozin (JARDIANCE) 10 MG TABS tablet Take 1 tablet (10 mg total) by mouth daily before breakfast.   ibuprofen (ADVIL) 200 MG tablet Take 200 mg by mouth daily as needed (pain).   loratadine (CLARITIN) 10 MG tablet Take 10 mg by mouth daily as needed for allergies.   nitroGLYCERIN (NITROSTAT) 0.4 MG SL tablet Take as needed for chest pain. 1 tablet under tongue every 5 mins up to 3 tablets.After 3 tablets if chest pain call 911.   sacubitril-valsartan (ENTRESTO) 24-26 MG Take 1 tablet by mouth 2 (two) times daily.   ticagrelor (BRILINTA) 60 MG TABS tablet TAKE 1 TABLET BY MOUTH 2 TIMES DAILY.   No facility-administered encounter medications on file as of 06/05/2023.    Past Medical History:  Diagnosis Date   Bipolar 1 disorder (HCC)  CAD (coronary artery disease)    a. STEMI 09/2012  single vessel occlusive CAD of the RCA s/p DES to mid RCA, EF 45%;  b. inferior STEMI 03/10/2015 DES to prox and mid RCA   Depression    Hyperlipidemia    LV dysfunction    a. 09/2012 Cath LV gram severe basal to midinferior wall hypokinesis, EF 45% and echo EF 55%.   PAF (paroxysmal atrial fibrillation) (HCC)    a. noted on admission 02/2015 in setting of inf stemi;  b. CHA2DS2VASc = 1.   Tobacco abuse     Past Surgical  History:  Procedure Laterality Date   APPENDECTOMY     CARDIAC CATHETERIZATION N/A 03/10/2015   Procedure: Left Heart Cath and Coronary Angiography;  Surgeon: Lennette Bihari, MD;  Location: [facility] INVASIVE CV LAB;  Service: Cardiovascular;  Laterality: N/A;   CARDIAC CATHETERIZATION  03/10/2015   Procedure: Coronary Stent Intervention;  Surgeon: Lennette Bihari, MD;  Location: [facility] INVASIVE CV LAB;  Service: Cardiovascular;;   INGUINAL HERNIA REPAIR Left 05/25/2021   Procedure: OPEN LEFT INGUINAL HERNIA AND EPIGASTRIC HERNIA REPAIR;  Surgeon: Berna Bue, MD;  Location: [facility] ORS;  Service: General;  Laterality: Left;   LEFT HEART CATHETERIZATION WITH CORONARY ANGIOGRAM N/A 09/27/2012   Procedure: LEFT HEART CATHETERIZATION WITH CORONARY ANGIOGRAM;  Surgeon: [name], MD;  Location: [facility] CATH LAB;  Service: Cardiovascular;  Laterality: N/A;   RIGHT/LEFT HEART CATH AND CORONARY ANGIOGRAPHY N/A 01/19/2023   Procedure: RIGHT/LEFT HEART CATH AND CORONARY ANGIOGRAPHY;  Surgeon: Swaziland, Peter M, MD;  Location: [facility] INVASIVE CV LAB;  Service: Cardiovascular;  Laterality: N/A;   TONSILLECTOMY      Family History  Problem Relation Age of Onset   CAD Father        deceased.    Social History   Socioeconomic History   Marital status: Married    Spouse name: Not on file   Number of children: Not on file   Years of education: Not on file   Highest education level: Not on file  Occupational History   Occupation: Marine scientist  Tobacco Use   Smoking status: Every Day    Current packs/day: 0.00    Average packs/day: 0.5 packs/day for 40.0 years (20.0 ttl pk-yrs)    Types: Cigarettes    Start date: 04/15/1975    Last attempt to quit: 04/15/2015    Years since quitting: 8.1    Passive exposure: Current   Smokeless tobacco: Never  Vaping Use   Vaping status: Never Used  Substance and Sexual Activity   Alcohol use: Not Currently    Comment: social   Drug use: No   Sexual activity: Yes   Other Topics Concern   Not on file  Social History Narrative   Lives in [place] by himself.  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  He does not routinely exercise.   Married.   Education: Academic librarian.   Exercise: Yes   Social Determinants of Health   Financial Resource Strain: Not on file  Food Insecurity: Not on file  Transportation Needs: Not on file  Physical Activity: Not on file  Stress: Not on file  Social Connections: Not on file  Intimate Partner Violence: Not on file    Objective    BP (!) 146/97 (BP Location: Right Arm, Patient Position: Sitting, Cuff Size: Normal)   Pulse 80   Ht 5\' 7"  (1.702 m)   Wt 149 lb 3.2 oz (67.7 kg)  SpO2 100%   BMI 23.37 kg/m   Physical Exam  67 year old male in no acute distress Cardiovascular exam with regular rate Lungs clear to auscultation bilaterally Bilateral shoulder: Obvious swelling, bruising or erythema: absent Deformity of the shoulder: absent Active ROM: diminished range with pain Passive ROM: full range with pain Strength: normal/normal Empty can: Positive Hawkins: Negative Neer's: Positive Neurovascular exam: intact  Assessment & Plan:   Bilateral shoulder pain, unspecified chronicity Assessment & Plan: It is possible that some of current symptoms may be related to statin myopathy.  He has not had prior laboratory evaluation with assessment of creatine kinase for further assessment of potential muscle damage.  He is not currently taking statin at this time. On review of chart, it does appear that prior provider who did bilateral shoulder injections felt this was more likely related to rotator cuff etiology/subacromial bursitis.  Patient did respond well to prior injections.  Exam in office would also suggest subacromial impingement/bursitis presently.  Discussed that patient likely would find benefit with similar injection again.  Given symptoms and time since last x-rays, would recommend proceeding with x-rays initially  and we can subsequently arrange for procedural visit once radiology report received.  Patient is amenable to this, we will proceed with this plan  Orders: -     DG Shoulder Right; Future -     DG Shoulder Left; Future  Right wrist pain Assessment & Plan: Concern for possible injury earlier this year that was not evaluated at that time.  Given return of symptoms and lack of prior evaluation, would proceed with initial x-ray imaging to assess for any underlying structural abnormality.  Will plan to manage accordingly based upon x-ray findings  Orders: -     DG Wrist Complete Right; Future  Return if symptoms worsen or fail to improve, for will schedule procedure once X-rays completed.   Spent 62 minutes on this patient encounter, including preparation, chart review, face-to-face counseling with patient and coordination of care, and documentation of encounter   ___________________________________________ Ranyah Groeneveld de [place], MD, ABFM, [facility] Primary Care and Sports Medicine [facility]

## 2023-06-05 NOTE — Assessment & Plan Note (Signed)
Concern for possible injury earlier this year that was not evaluated at that time.  Given return of symptoms and lack of prior evaluation, would proceed with initial x-ray imaging to assess for any underlying structural abnormality.  Will plan to manage accordingly based upon x-ray findings

## 2023-06-05 NOTE — Assessment & Plan Note (Signed)
It is possible that some of current symptoms may be related to statin myopathy.  He has not had prior laboratory evaluation with assessment of creatine kinase for further assessment of potential muscle damage.  He is not currently taking statin at this time. On review of chart, it does appear that prior provider who did bilateral shoulder injections felt this was more likely related to rotator cuff etiology/subacromial bursitis.  Patient did respond well to prior injections.  Exam in office would also suggest subacromial impingement/bursitis presently.  Discussed that patient likely would find benefit with similar injection again.  Given symptoms and time since last x-rays, would recommend proceeding with x-rays initially and we can subsequently arrange for procedural visit once radiology report received.  Patient is amenable to this, we will proceed with this plan

## 2023-06-11 ENCOUNTER — Telehealth (HOSPITAL_BASED_OUTPATIENT_CLINIC_OR_DEPARTMENT_OTHER): Payer: Self-pay | Admitting: *Deleted

## 2023-06-11 NOTE — Telephone Encounter (Signed)
Pt advised that xray report has not came back. The pain is getting worse and he has been going through this for weeks. Wants to know what he can do for pain until the xray comes back or what is recommended. Advised Dr Tommi Rumps [place] is out of the office and will respond once he returns

## 2023-06-11 NOTE — Telephone Encounter (Signed)
Pt called regarding x rays taken last week and never hearing anything back. His pain is worse and would like to know what he should do.   Can you please view xrays since provider is out of the office?

## 2023-06-13 NOTE — Telephone Encounter (Signed)
Called pt to let know Dr Galen Daft recommendation patient stated that due to the statin myopathy condition that he has it leads him to believe that [facility] and [facility] does not know what they are doing at all. Let him know that scheduling steroid injection would be his decision and he could decide if he chose to move forward. Pt scheduled appointment for steroid injection 10/31

## 2023-06-14 ENCOUNTER — Ambulatory Visit (HOSPITAL_BASED_OUTPATIENT_CLINIC_OR_DEPARTMENT_OTHER): Payer: BC Managed Care – PPO | Admitting: Family Medicine

## 2023-06-14 ENCOUNTER — Encounter (HOSPITAL_BASED_OUTPATIENT_CLINIC_OR_DEPARTMENT_OTHER): Payer: Self-pay | Admitting: Family Medicine

## 2023-06-14 VITALS — BP 131/81 | HR 87 | Ht 67.0 in | Wt 148.7 lb

## 2023-06-14 DIAGNOSIS — M25531 Pain in right wrist: Secondary | ICD-10-CM | POA: Diagnosis not present

## 2023-06-14 DIAGNOSIS — M25331 Other instability, right wrist: Secondary | ICD-10-CM | POA: Diagnosis not present

## 2023-06-14 DIAGNOSIS — M25512 Pain in left shoulder: Secondary | ICD-10-CM | POA: Diagnosis not present

## 2023-06-14 DIAGNOSIS — M25511 Pain in right shoulder: Secondary | ICD-10-CM

## 2023-06-14 NOTE — Assessment & Plan Note (Signed)
Patient with history of fall in the past and with current wrist pain and swelling.  He had x-ray completed since last appointment.  Results of x-ray indicate underlying arthritis, widening of scapholunate interval suggesting possible injury to scapholunate ligament.  Given this, would recommend further evaluation with orthopedic surgeon, referral placed today.

## 2023-06-14 NOTE — Assessment & Plan Note (Signed)
Patient presents for follow-up of bilateral shoulder pain.  X-rays completed since last visit. Shoulder x-rays with evidence of mild osteoarthritis both at glenohumeral joint and AC joint bilaterally.  We discussed options today including bilateral subacromial injections which patient has received from alternative provider in the past and had good response with.  He is interested in proceeding with this.  As discussed previously, we again reviewed potential side effects and adverse reactions which can occur.  Patient voiced understanding.  See procedure note above. We would expect to see at least 3 months improvement with steroid injection.  If this is the case and pain does return in the future, could consider repeat steroid injection.  If incomplete response with steroid injection, consider alternative treatment or evaluation with orthopedic surgeon.

## 2023-06-14 NOTE — Progress Notes (Signed)
    Procedures performed today:    Procedure: Corticosteroid injection of the subacromial bursa, bilateral Verbal informed consent obtained. Time-out conducted. Noted no overlying erythema, induration, or other signs of local infection.  Skin prepped in a sterile fashion. Local anesthesia: Topical Ethyl chloride. With sterile technique: 1 cc Kenalog 40, 3 cc 1% lidocaine injected easily; repeated for contralateral shoulder Completed without difficulty Advised to call if fevers/chills, erythema, induration, drainage, or persistent bleeding. Impression: Technically successful bilateral subacromial injections.  Independent interpretation of notes and tests performed by another provider:   None.  Brief History, Exam, Impression, and Recommendations:    BP 131/81 (BP Location: Right Arm, Patient Position: Sitting, Cuff Size: Normal)   Pulse 87   Ht 5\' 7"  (1.702 m)   Wt 148 lb 11.2 oz (67.4 kg)   SpO2 100%   BMI 23.29 kg/m   Bilateral shoulder pain, unspecified chronicity Assessment & Plan: Patient presents for follow-up of bilateral shoulder pain.  X-rays completed since last visit. Shoulder x-rays with evidence of mild osteoarthritis both at glenohumeral joint and AC joint bilaterally.  We discussed options today including bilateral subacromial injections which patient has received from alternative provider in the past and had good response with.  He is interested in proceeding with this.  As discussed previously, we again reviewed potential side effects and adverse reactions which can occur.  Patient voiced understanding.  See procedure note above. We would expect to see at least 3 months improvement with steroid injection.  If this is the case and pain does return in the future, could consider repeat steroid injection.  If incomplete response with steroid injection, consider alternative treatment or evaluation with orthopedic surgeon.   Right wrist pain Assessment & Plan: Patient  with history of fall in the past and with current wrist pain and swelling.  He had x-ray completed since last appointment.  Results of x-ray indicate underlying arthritis, widening of scapholunate interval suggesting possible injury to scapholunate ligament.  Given this, would recommend further evaluation with orthopedic surgeon, referral placed today.  Orders: -     Ambulatory referral to Orthopedic Surgery  Scapholunate dissociation, right -     Ambulatory referral to Orthopedic Surgery  Return in about 3 months (around 09/14/2023).   ___________________________________________ Jason Fuhs de [place], MD, ABFM, [facility] Primary Care and Sports Medicine [facility]

## 2023-06-22 ENCOUNTER — Other Ambulatory Visit (HOSPITAL_COMMUNITY): Payer: Self-pay

## 2023-06-25 ENCOUNTER — Other Ambulatory Visit (HOSPITAL_COMMUNITY): Payer: Self-pay

## 2023-06-26 ENCOUNTER — Other Ambulatory Visit (HOSPITAL_COMMUNITY): Payer: Self-pay

## 2023-06-27 NOTE — Telephone Encounter (Signed)
Pharmacy Patient Advocate Encounter  Received notification from [facility] that Prior Authorization for livalo has been APPROVED from 06/27/23 to 06/21/24   PA #/Case ID/Reference #: [id number]

## 2023-07-08 NOTE — Progress Notes (Unsigned)
CARDIOLOGY OFFICE NOTE  Date:  07/10/2023    Jason Maldonado Date of Birth: 1956/07/14 Medical Record #[id number]  PCP:  de [place], Raymond J, MD  Cardiologist:  [place]    Chief Complaint  Patient presents with   Coronary Artery Disease   Hypertension   Congestive Heart Failure    History of Present Illness: Jason Maldonado is a 67 y.o. male who is seen for follow up CAD and new onset CHF.  He has a past medical history of hyperlipidemia, bipolar disorder, tobacco abuse, and a history of CAD status post previous inferior MI in 2014 with emergent PCI to the RCA.   He had second STEMI on 03/10/2015 as a inferior STEMI in the setting of RCA in-stent restenosis/occlusion. Cardiac catheterization on 03/10/2015 showed 100% stenosis of prox and mid RCA prox to a previously placed unknown stent, 4.038 mm Resolute DES stent postdilated to 4.41 mm proximally and 4.1 mm distally. 50% residual RPDA, 40% 1st RPLB, 40%distal RCA lesion. Post cath, he was placed on aspirin and Brilinta. Echo EF 30-35%, basal to mid inferior akinesis, inferoseptal and inferolateral severe hypokinesis. He did have refractory hypotension that gradually improved. Repeat Echo in October 2016 showed inferior HK with EF 50-55%.    On a prior  visit noted a vision change and was diagnosed with a central retinal vein occlusion. Saw a  retinal specialist and has had steroid injections. He did not want to take PCSK 9 inhibitors in the past but is now on  Nexlizet.   He did have left inguinal hernia repair in Oct 2022.   He contacted our office in February complaining of a "sinking feeling", dizziness, lightheadedness associated with palpitations that began in January.  Seen by Bernadene Person NP. A 14-day monitor was placed that showed episodes of wide-complex tachycardia, some quite long > 5 hours. Was seen by Dr Nelly Laurence with EP. Echo done showing significant drop in EF to 20-25% with inferior and inferolateral AK.  He underwent  outpatient R/LHC on 01/19/2023 revealed nonobstructive CAD, continue patency of stents in the RCA, normal LV filling pressures, normal right heart pressures, reduced cardiac output.  Optimization of heart failure therapy was recommended it was felt that his LV failure was likely due to negative remodeling from prior infarcts.  He was started on Entresto and carvedilol.  Jardiance was also added to his medication regimen.  He was advised to follow-up with EP for consideration of ICD. He was seen by Dr Nelly Laurence in July - recommended repeat assessment of EF 90 days post optimization of meds.   He was seen by Pharm D for lipid management. Pitivastatin stopped due persistent myalgias. Did not improve. Nexlizet also stopped. Discussed Repatha therapy. Referred to sports medicine for his persistent myalgias. He had injections in his shoulders and this knocked out the pain he was experiencing.   Repeat Echo in September showed EF 30-35%.   On follow up today he does feel better on Entresto and Jardiance and is tolerating well. He has considered going on Repatha but for now wants to stop all cholesterol medication. No swelling. He does have chronic cough and nasal drainage. He still experiences episodes of heart racing. Usually triggered by stress/emotions. If he sits down it will resolve. No dizziness or syncope.    Past Medical History:  Diagnosis Date   Bipolar 1 disorder (HCC)    CAD (coronary artery disease)    a. STEMI 09/2012  single vessel occlusive  CAD of the RCA s/p DES to mid RCA, EF 45%;  b. inferior STEMI 03/10/2015 DES to prox and mid RCA   Depression    Hyperlipidemia    LV dysfunction    a. 09/2012 Cath LV gram severe basal to midinferior wall hypokinesis, EF 45% and echo EF 55%.   PAF (paroxysmal atrial fibrillation) (HCC)    a. noted on admission 02/2015 in setting of inf stemi;  b. CHA2DS2VASc = 1.   Tobacco abuse     Past Surgical History:  Procedure Laterality Date   APPENDECTOMY      CARDIAC CATHETERIZATION N/A 03/10/2015   Procedure: Left Heart Cath and Coronary Angiography;  Surgeon: Lennette Bihari, MD;  Location: [facility] INVASIVE CV LAB;  Service: Cardiovascular;  Laterality: N/A;   CARDIAC CATHETERIZATION  03/10/2015   Procedure: Coronary Stent Intervention;  Surgeon: Lennette Bihari, MD;  Location: [facility] INVASIVE CV LAB;  Service: Cardiovascular;;   INGUINAL HERNIA REPAIR Left 05/25/2021   Procedure: OPEN LEFT INGUINAL HERNIA AND EPIGASTRIC HERNIA REPAIR;  Surgeon: Berna Bue, MD;  Location: [facility] ORS;  Service: General;  Laterality: Left;   LEFT HEART CATHETERIZATION WITH CORONARY ANGIOGRAM N/A 09/27/2012   Procedure: LEFT HEART CATHETERIZATION WITH CORONARY ANGIOGRAM;  Surgeon: Talicia Sui M [place], MD;  Location: [facility] CATH LAB;  Service: Cardiovascular;  Laterality: N/A;   RIGHT/LEFT HEART CATH AND CORONARY ANGIOGRAPHY N/A 01/19/2023   Procedure: RIGHT/LEFT HEART CATH AND CORONARY ANGIOGRAPHY;  Surgeon: [place], Amit Leece M, MD;  Location: [facility] INVASIVE CV LAB;  Service: Cardiovascular;  Laterality: N/A;   TONSILLECTOMY       Medications: Current Outpatient Medications  Medication Sig Dispense Refill   aspirin EC 81 MG EC tablet Take 1 tablet (81 mg total) by mouth daily.     carvedilol (COREG) 6.25 MG tablet Take 1 tablet (6.25 mg total) by mouth 2 (two) times daily. 180 tablet 3   empagliflozin (JARDIANCE) 10 MG TABS tablet Take 1 tablet (10 mg total) by mouth daily before breakfast. 30 tablet 11   ibuprofen (ADVIL) 200 MG tablet Take 200 mg by mouth daily as needed (pain).     loratadine (CLARITIN) 10 MG tablet Take 10 mg by mouth daily as needed for allergies.     nitroGLYCERIN (NITROSTAT) 0.4 MG SL tablet Take as needed for chest pain. 1 tablet under tongue every 5 mins up to 3 tablets.After 3 tablets if chest pain call 911. 25 tablet 11   sacubitril-valsartan (ENTRESTO) 24-26 MG Take 1 tablet by mouth 2 (two) times daily. 180 tablet 3   ticagrelor (BRILINTA) 60 MG TABS tablet  TAKE 1 TABLET BY MOUTH 2 TIMES DAILY. 180 tablet 2   No current facility-administered medications for this visit.    Allergies: No Known Allergies  Social History: The patient  reports that he has been smoking cigarettes. He started smoking about 48 years ago. He has a 20 pack-year smoking history. He has been exposed to tobacco smoke. He has never used smokeless tobacco. He reports that he does not currently use alcohol. He reports that he does not use drugs.   Family History: The patient's family history includes CAD in his father.   Review of Systems: Please see the history of present illness.    All other systems are reviewed and negative.   Physical Exam: VS:  BP 122/60 (BP Location: Left Arm, Patient Position: Sitting, Cuff Size: Normal)   Pulse 76   Ht 5\' 7"  (1.702 m)   Wt 149 lb (  67.6 kg)   SpO2 97%   BMI 23.34 kg/m  .  BMI Body mass index is 23.34 kg/m.  Wt Readings from Last 3 Encounters:  07/10/23 149 lb (67.6 kg)  06/14/23 148 lb 11.2 oz (67.4 kg)  06/05/23 149 lb 3.2 oz (67.7 kg)   GENERAL:  Well appearing WM in NAD HEENT:  PERRL, EOMI, sclera are clear. Oropharynx is clear. NECK:  No jugular venous distention, carotid upstroke brisk and symmetric, no bruits, no thyromegaly or adenopathy LUNGS:  Clear to auscultation bilaterally CHEST:  Unremarkable HEART:  RRR,  PMI not displaced or sustained,S1 and S2 within normal limits, no S3, no S4: no clicks, no rubs, no murmurs ABD:  Soft, nontender. BS +, no masses or bruits. No hepatomegaly, no splenomegaly EXT:  2 + pulses throughout, no edema, no cyanosis no clubbing SKIN:  Warm and dry.  No rashes NEURO:  Alert and oriented x 3. Cranial nerves II through XII intact. PSYCH:  Cognitively intact     LABORATORY DATA:  EKG:  EKG is not ordered today.      Lab Results  Component Value Date   WBC 8.2 01/17/2023   HGB 15.6 01/19/2023   HCT 46.0 01/19/2023   PLT 221 01/17/2023   GLUCOSE 77 02/06/2023    CHOL 182 05/08/2023   TRIG 105 05/08/2023   HDL 49 05/08/2023   LDLCALC 114 (H) 05/08/2023   ALT 12 05/08/2023   AST 22 05/08/2023   NA 140 02/06/2023   K 4.4 02/06/2023   CL 101 02/06/2023   CREATININE 1.46 (H) 02/06/2023   BUN 23 02/06/2023   CO2 21 02/06/2023   TSH 1.690 09/25/2022   PSA 1.30 08/03/2015   INR 1.0 01/17/2023   HGBA1C 5.7 (H) 09/27/2012    BNP (last 3 results) No results for input(s): "BNP" in the last 8760 hours.  ProBNP (last 3 results) No results for input(s): "PROBNP" in the last 8760 hours.   Other Studies Reviewed Today: Echo: 06/08/15:Study Conclusions  - Procedure narrative: Limited transthoracic echocardiography for   left ventricular function evaluation. Image quality was adequate. - Left ventricle: The cavity size was normal. Wall thickness was   normal. Systolic function was normal. The estimated ejection   fraction was in the range of 50% to 55%. There is inferior and   inferoseptal hypokinesis.  Impressions:  - Limited echo for LV function. There is inferior and inferoseptal   hypokinesis. LVEF has improved to 50-55%.  Event monitor 10/30/22: Study Highlights      Normal sinus rhythm   Frequent and sustained runs of wide complex tachycardia most c/w ventricular tachycardia. longest lasting 5 hours 47 minutes. Patient symptoms correlate with this.   Rare brief runs of SVT.     Patch Wear Time:  13 days and 23 hours (2024-02-17T16:17:09-498 to 2024-03-02T16:16:58-0500)   Patient had a min HR of 40 bpm, max HR of 210 bpm, and avg HR of 87 bpm. Predominant underlying rhythm was Sinus Rhythm. First Degree AV Block was present. 274 Ventricular Tachycardia runs occurred, the run with the fastest interval lasting 5 hours 47  mins with a max rate of 210 bpm (avg 161 bpm); the run with the fastest interval was also the longest. 5 Supraventricular Tachycardia runs occurred, the run with the fastest interval lasting 11 beats with a max rate of  156 bpm (avg 138 bpm); the run with  the fastest interval was also the longest. 1 episode(s) of AV Block (2nd) occurred,  lasting a total of 2 secs. Ventricular Tachycardia was detected within +/- 45 seconds of symptomatic patient event(s). Isolated SVEs were frequent (8.3%, [id number]), SVE  Couplets were rare (<1.0%, 508), and SVE Triplets were rare (<1.0%, 546). Isolated VEs were occasional (1.8%, 31436), VE Couplets were rare (<1.0%, 16), and VE Triplets were rare (<1.0%, 4). Ventricular Bigeminy and Trigeminy were present. MD  notification criteria for Ventricular Tachycardia met - notified Karrie Doffing on 30 Oct 2022 at 10:03 am CT (MD).    Echo 01/05/23: IMPRESSIONS     1. Global hypokinesis with akinesis of the inferior and inferolateral  walls; overall severe LV dysfunction.   2. Left ventricular ejection fraction, by estimation, is 25 to 30%. The  left ventricle has severely decreased function. The left ventricle  demonstrates regional wall motion abnormalities (see scoring  diagram/findings for description). The left  ventricular internal cavity size was mildly dilated. There is moderate  left ventricular hypertrophy. Left ventricular diastolic parameters are  consistent with Grade I diastolic dysfunction (impaired relaxation).   3. Right ventricular systolic function is normal. The right ventricular  size is normal.   4. Left atrial size was mildly dilated.   5. The mitral valve is normal in structure. No evidence of mitral valve  regurgitation. No evidence of mitral stenosis.   6. The aortic valve is tricuspid. Aortic valve regurgitation is not  visualized. Aortic valve sclerosis is present, with no evidence of aortic  valve stenosis.   7. The inferior vena cava is normal in size with greater than 50%  respiratory variability, suggesting right atrial pressure of 3 mmHg.   Comparison(s): No prior Echocardiogram.   Cardiac cath 01/19/23:  RIGHT/LEFT HEART CATH AND CORONARY  ANGIOGRAPHY   Conclusion      Mid RCA to Dist RCA lesion is 60% stenosed.   Mid RCA lesion is 40% stenosed.   Prox LAD to Mid LAD lesion is 20% stenosed.   1st Diag lesion is 30% stenosed.   LV end diastolic pressure is normal.   Nonobstructive CAD. Continued patency of stents in the RCA Normal LV filling pressures.  Normal right heart pressures Cardiac output is reduced. CO 3.3 L/min with index 1.84   Plan: continue to optimize CHF therapy. Suspect LV failure due to negative remodeling from prior infarcts. Now on Entresto and Coreg. Will add Jardiance. Suspect wide complex tachycardia is scar mediated from prior infarcts.   Echo 04/26/23: IMPRESSIONS     1. Left ventricular ejection fraction, by estimation, is 30 to 35%. The  left ventricle has moderately decreased function. The left ventricle  demonstrates global hypokinesis. Left ventricular diastolic parameters are  indeterminate.   2. Right ventricular systolic function is normal. The right ventricular  size is normal. Tricuspid regurgitation signal is inadequate for assessing  PA pressure.   3. Left atrial size was mildly dilated.   4. The mitral valve is grossly normal. Trivial mitral valve  regurgitation. No evidence of mitral stenosis.   5. The aortic valve is tricuspid. There is mild calcification of the  aortic valve. Aortic valve regurgitation is not visualized. No aortic  stenosis is present.   6. The inferior vena cava is normal in size with greater than 50%  respiratory variability, suggesting right atrial pressure of 3 mmHg.   Assessment/Plan:  1. CAD s/p inferior MI with stenting in 2014. Recurrent STEMI in July 2016 with instent restenosis/occlusion. S/p repeat DES. On DAPT. Would favor continuing this long term. Continue Brilinta 60 mg  bid and ASA 81 mg daily. Continue low dose beta blocker. Unable to tolerate higher dose due to low BP in past. Cardiac cath showed nonobstructive CAD. Normal filling pressures.    2. Wide complex tachycardia most likely V tach. Has seen Dr Nelly Laurence for consideration of ICD. EF did improve a little with medical therapy but still 30-35%. He does not want to have ICD at this time. Understands ramifications of this. We decided that if he ever did pass out we would need to have one placed.    3. Chronic combined  systolic/diastolic  CHF. EF 20-25%. Now on Entresto 24/26 mg bid, Coreg and Jardiance. EF improved to 30-35%. Has had hypotension in the past so will continue current dose.    4. Hyperlipidemia. Intolerant of lipitor, Livalo, and Nexlizet. LDL 114 off meds. Does not wish to pursue other therapy (Repatha) at this time.   5.. Bipolar disorder.   6. Tobacco abuse. Encourage efforts at complete smoking cessation. Suggested he may want to have screening CT for cancer. Will discuss with PCP  Current medicines are reviewed with the patient today.  The patient does not have concerns regarding medicines other than what has been noted above.  The following changes have been made:  See above.  Labs/ tests ordered today include:    No orders of the defined types were placed in this encounter.    Disposition:   FU  6 months   Signed: Levie Wages [place] MD, [facility]    07/10/2023 5:05 PM

## 2023-07-10 ENCOUNTER — Ambulatory Visit: Payer: BC Managed Care – PPO | Attending: Cardiology | Admitting: Cardiology

## 2023-07-10 ENCOUNTER — Encounter: Payer: Self-pay | Admitting: Cardiology

## 2023-07-10 VITALS — BP 122/60 | HR 76 | Ht 67.0 in | Wt 149.0 lb

## 2023-07-10 DIAGNOSIS — R000 Tachycardia, unspecified: Secondary | ICD-10-CM

## 2023-07-10 DIAGNOSIS — E7800 Pure hypercholesterolemia, unspecified: Secondary | ICD-10-CM

## 2023-07-10 DIAGNOSIS — I5042 Chronic combined systolic (congestive) and diastolic (congestive) heart failure: Secondary | ICD-10-CM

## 2023-07-10 DIAGNOSIS — I2510 Atherosclerotic heart disease of native coronary artery without angina pectoris: Secondary | ICD-10-CM | POA: Diagnosis not present

## 2023-07-10 NOTE — Patient Instructions (Signed)

## 2023-07-30 ENCOUNTER — Ambulatory Visit: Payer: BC Managed Care – PPO | Admitting: Orthopedic Surgery

## 2023-09-18 ENCOUNTER — Ambulatory Visit (INDEPENDENT_AMBULATORY_CARE_PROVIDER_SITE_OTHER): Payer: BC Managed Care – PPO | Admitting: Family Medicine

## 2023-09-18 ENCOUNTER — Encounter (HOSPITAL_BASED_OUTPATIENT_CLINIC_OR_DEPARTMENT_OTHER): Payer: Self-pay | Admitting: Family Medicine

## 2023-09-18 VITALS — BP 139/93 | HR 76 | Ht 67.0 in | Wt 144.7 lb

## 2023-09-18 DIAGNOSIS — M25512 Pain in left shoulder: Secondary | ICD-10-CM | POA: Diagnosis not present

## 2023-09-18 DIAGNOSIS — M25331 Other instability, right wrist: Secondary | ICD-10-CM

## 2023-09-18 DIAGNOSIS — M25531 Pain in right wrist: Secondary | ICD-10-CM

## 2023-09-18 DIAGNOSIS — M25511 Pain in right shoulder: Secondary | ICD-10-CM | POA: Diagnosis not present

## 2023-09-18 DIAGNOSIS — Z0000 Encounter for general adult medical examination without abnormal findings: Secondary | ICD-10-CM

## 2023-09-18 NOTE — Assessment & Plan Note (Signed)
 Prior XR revealed underlying arthritis, widening of scapholunate interval suggesting possible injury to scapholunate ligament.  Recommended further evaluation with ortho, patient reports that he did schedule appointment, but did ultimately cancel appointment as he felt that he was doing well and did not need to see specialist. Again reviewed that XR revealed structural issues requiring further evaluation with ortho. He is reluctant related to this. Open to meeting with ortho, did discuss that he may ultimately need to meet with upper extremity specialist.

## 2023-09-18 NOTE — Progress Notes (Signed)
    Procedures performed today:    None.  Independent interpretation of notes and tests performed by another provider:   None.  Brief History, Exam, Impression, and Recommendations:    BP (!) 139/93 (BP Location: Right Arm, Patient Position: Sitting, Cuff Size: Normal)   Pulse 76   Ht 5' 7 (1.702 m)   Wt 144 lb 11.2 oz (65.6 kg)   SpO2 99%   BMI 22.66 kg/m   Right wrist pain Assessment & Plan: Prior XR revealed underlying arthritis, widening of scapholunate interval suggesting possible injury to scapholunate ligament.  Recommended further evaluation with ortho, patient reports that he did schedule appointment, but did ultimately cancel appointment as he felt that he was doing well and did not need to see specialist. Again reviewed that XR revealed structural issues requiring further evaluation with ortho. He is reluctant related to this. Open to meeting with ortho, did discuss that he may ultimately need to meet with upper extremity specialist.  Orders: -     Ambulatory referral to Orthopedic Surgery  Scapholunate dissociation, right -     Ambulatory referral to Orthopedic Surgery  Bilateral shoulder pain, unspecified chronicity Assessment & Plan: Patient presents for follow-up of bilateral shoulder pain, received CSI at last visit about 3 months. Had notable relief following injection, but pain did return a little over 2 months later. Continues with bilateral shoulder pain. Interested in meeting with orthopedic surgeon to further discuss, referral placed.  Orders: -     Ambulatory referral to Orthopedic Surgery  Wellness examination -     CBC with Differential/Platelet; Future -     Comprehensive metabolic panel; Future -     TSH Rfx on Abnormal to Free T4; Future -     Hemoglobin A1c; Future  Return in about 6 months (around 03/17/2024) for CPE with fasting labs 1 week prior.   ___________________________________________ Parthiv Mucci [place], MD, ABFM, CAQSM Primary Care  and Sports Medicine [facility]

## 2023-09-18 NOTE — Patient Instructions (Signed)
  Medication Instructions:  Your physician recommends that you continue on your current medications as directed. Please refer to the Current Medication list given to you today. --If you need a refill on any your medications before your next appointment, please call your pharmacy first. If no refills are authorized on file call the office.-- Lab Work: Your physician has recommended that you have lab work today: 1 week before next visit  If you have labs (blood work) drawn today and your tests are completely normal, you will receive your results via MyChart message OR a phone call from our staff.  Please ensure you check your voicemail in the event that you authorized detailed messages to be left on a delegated number. If you have any lab test that is abnormal or we need to change your treatment, we will call you to review the results.   Follow-Up: Your next appointment:   Your physician recommends that you schedule a follow-up appointment in: 6 months physical with Dr. de [place]  You will receive a text message or e-mail with a link to a survey about your care and experience with [place] today! We would greatly appreciate your feedback!   Thanks for letting us be apart of your health journey!!  Primary Care and Sports Medicine   Dr. Ceasar Mons [place]   We encourage you to activate your patient portal called "MyChart".  Sign up information is provided on this After Visit Summary.  MyChart is used to connect with patients for Virtual Visits (Telemedicine).  Patients are able to view lab/test results, encounter notes, upcoming appointments, etc.  Non-urgent messages can be sent to your provider as well. To learn more about what you can do with MyChart, please visit --  [email or web address].

## 2023-09-18 NOTE — Assessment & Plan Note (Signed)
 Patient presents for follow-up of bilateral shoulder pain, received CSI at last visit about 3 months. Had notable relief following injection, but pain did return a little over 2 months later. Continues with bilateral shoulder pain. Interested in meeting with orthopedic surgeon to further discuss, referral placed.

## 2023-10-09 ENCOUNTER — Ambulatory Visit (INDEPENDENT_AMBULATORY_CARE_PROVIDER_SITE_OTHER): Payer: BC Managed Care – PPO | Admitting: Student

## 2023-10-09 ENCOUNTER — Encounter (HOSPITAL_BASED_OUTPATIENT_CLINIC_OR_DEPARTMENT_OTHER): Payer: Self-pay | Admitting: Student

## 2023-10-09 ENCOUNTER — Ambulatory Visit (HOSPITAL_BASED_OUTPATIENT_CLINIC_OR_DEPARTMENT_OTHER): Payer: BC Managed Care – PPO

## 2023-10-09 DIAGNOSIS — M19131 Post-traumatic osteoarthritis, right wrist: Secondary | ICD-10-CM | POA: Diagnosis not present

## 2023-10-09 DIAGNOSIS — M25532 Pain in left wrist: Secondary | ICD-10-CM

## 2023-10-09 DIAGNOSIS — M19132 Post-traumatic osteoarthritis, left wrist: Secondary | ICD-10-CM | POA: Diagnosis not present

## 2023-10-09 DIAGNOSIS — M19032 Primary osteoarthritis, left wrist: Secondary | ICD-10-CM | POA: Diagnosis not present

## 2023-10-09 MED ORDER — MELOXICAM 15 MG PO TABS: 15.0000 mg | ORAL_TABLET | Freq: Every day | ORAL | 0 refills | Status: AC

## 2023-10-09 NOTE — Progress Notes (Unsigned)
 Chief Complaint: Bilateral wrist pain     History of Present Illness:   Discussed the use of AI scribe software for clinical note transcription with the patient, who gave verbal consent to proceed.  Jason Maldonado is a 68 year old male with statin myopathy who presents with bilateral wrist pain, left worse than right. The pain is described as throbbing and hot, with difficulty finding a comfortable position. There is numbness and tingling in the fingertips of the left hand, excluding the thumb. The pain sometimes radiates up the forearm. He recalls a FOOSH injury approximately two years ago to his right hand. 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 He previously received cortisone injections in his shoulders for pain relief, which were effective. He has been using ibuprofen moderately to manage the pain and has tried topical treatments like Voltaren without success. He is right-hand dominant.     Surgical History:   None  PMH/PSH/Family History/Social History/Meds/Allergies:    Past Medical History:  Diagnosis Date   Bipolar 1 disorder (HCC)    CAD (coronary artery disease)    a. STEMI 09/2012  single vessel occlusive CAD of the RCA s/p DES to mid RCA, EF 45%;  b. inferior STEMI 03/10/2015 DES to prox and mid RCA   Depression    Hyperlipidemia    LV dysfunction    a. 09/2012 Cath LV gram severe basal to midinferior wall hypokinesis, EF 45% and echo EF 55%.   PAF (paroxysmal atrial fibrillation) (HCC)    a. noted on admission 02/2015 in setting of inf stemi;  b. CHA2DS2VASc = 1.   Tobacco abuse    Past Surgical History:  Procedure Laterality Date   APPENDECTOMY     CARDIAC CATHETERIZATION N/A 03/10/2015   Procedure: Left Heart Cath and Coronary Angiography;  Surgeon:  Lennette Bihari, MD;  Location: [facility] INVASIVE CV LAB;  Service: Cardiovascular;  Laterality: N/A;   CARDIAC CATHETERIZATION  03/10/2015   Procedure: Coronary Stent Intervention;  Surgeon: Lennette Bihari, MD;  Location: [facility] INVASIVE CV LAB;  Service: Cardiovascular;;   INGUINAL HERNIA REPAIR Left 05/25/2021   Procedure: OPEN LEFT INGUINAL HERNIA AND EPIGASTRIC HERNIA REPAIR;  Surgeon: Berna Bue, MD;  Location: [facility] ORS;  Service: General;  Laterality: Left;   LEFT HEART CATHETERIZATION WITH CORONARY ANGIOGRAM N/A 09/27/2012   Procedure: LEFT HEART CATHETERIZATION WITH CORONARY ANGIOGRAM;  Surgeon: [name], MD;  Location: [facility] CATH LAB;  Service: Cardiovascular;  Laterality: N/A;   RIGHT/LEFT HEART CATH AND CORONARY ANGIOGRAPHY N/A 01/19/2023   Procedure: RIGHT/LEFT HEART CATH AND CORONARY ANGIOGRAPHY;  Surgeon: Swaziland, Peter M, MD;  Location: [facility] INVASIVE CV LAB;  Service: Cardiovascular;  Laterality: N/A;   TONSILLECTOMY     Social History   Socioeconomic History   Marital status: Married    Spouse name: Not on file   Number of children: Not on file   Years of education: Not on file   Highest education level: Not on file  Occupational History   Occupation: Marine scientist  Tobacco Use   Smoking status: Every Day    Current packs/day: 0.00    Average packs/day: 0.5 packs/day  for 40.0 years (20.0 ttl pk-yrs)    Types: Cigarettes    Start date: 04/15/1975    Last attempt to quit: 04/15/2015    Years since quitting: 8.4    Passive exposure: Current   Smokeless tobacco: Never  Vaping Use   Vaping status: Never Used  Substance and Sexual Activity   Alcohol use: Not Currently    Comment: social   Drug use: No   Sexual activity: Yes  Other Topics Concern   Not on file  Social History Narrative   Lives in [place] by himself.  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  He does not routinely exercise.   Married.   Education: Academic librarian.   Exercise: Yes   Social Drivers of Manufacturing engineer Strain: Not on file  Food Insecurity: Not on file  Transportation Needs: Not on file  Physical Activity: Not on file  Stress: Not on file  Social Connections: Not on file   Family History  Problem Relation Age of Onset   CAD Father        deceased.   No Known Allergies Current Outpatient Medications  Medication Sig Dispense Refill   aspirin EC 81 MG EC tablet Take 1 tablet (81 mg total) by mouth daily.     carvedilol (COREG) 6.25 MG tablet Take 1 tablet (6.25 mg total) by mouth 2 (two) times daily. 180 tablet 3   empagliflozin (JARDIANCE) 10 MG TABS tablet Take 1 tablet (10 mg total) by mouth daily before breakfast. 30 tablet 11   ibuprofen (ADVIL) 200 MG tablet Take 200 mg by mouth daily as needed (pain).     loratadine (CLARITIN) 10 MG tablet Take 10 mg by mouth daily as needed for allergies.     nitroGLYCERIN (NITROSTAT) 0.4 MG SL tablet Take as needed for chest pain. 1 tablet under tongue every 5 mins up to 3 tablets.After 3 tablets if chest pain call 911. 25 tablet 11   sacubitril-valsartan (ENTRESTO) 24-26 MG Take 1 tablet by mouth 2 (two) times daily. 180 tablet 3   ticagrelor (BRILINTA) 60 MG TABS tablet TAKE 1 TABLET BY MOUTH 2 TIMES DAILY. 180 tablet 2   No current facility-administered medications for this visit.   No results found.  Review of Systems:   A ROS was performed including pertinent positives and negatives as documented in the HPI.  Physical Exam :   Constitutional: NAD and appears stated age Neurological: Alert and oriented Psych: Appropriate affect and cooperative There were no vitals taken for this visit.   Comprehensive Musculoskeletal Exam:    Evaluation of bilateral wrists demonstrates tenderness dorsally over the SL ligaments and diffusely through the carpals.  Left wrist ROM to 20 degrees extension and 30 degrees flexion.  Right wrist ROM to 45 degrees extension and 30 degrees flexion.  Grip strength significantly decreased  in the left hand.  Imaging:   Xray (left wrist 3 views): Negative for acute abnormality.  Widening of the scapholunate interval.  Advanced arthritis within the capitolunate and radioscaphoid joints   Xray review from 06/05/2023 (right wrist 3 views): No evidence of fracture or dislocation.  Widening of the scapholunate interval.  Subchondral cystic changes throughout the carpal bones.  Sclerosis and complete loss of space between capitate and lunate.   I personally reviewed and interpreted the radiographs.  Assessment and Plan:    Wrist Pain   Chronic bilateral wrist pain, worse on the left, persists following a fall injury two years ago. X-rays  reveal bilateral SL interval widening with subsequent arthritis, along with chondrocalcinosis. Numbness and tingling occur in the left hand, mainly in the four fingers. Start Meloxicam 15mg  daily for 10 days. Consider a short course of oral steroid (Medrol) if no improvement with Meloxicam. Use a wrist brace for support and stability. Consider light duty at work to avoid heavy lifting, squeezing, and torquing. Apply ice for 15 minutes when the wrist feels hot and inflamed. Referral to hand specialist Dr. Fara Boros for further evaluation and treatment options, discussed possible injection.       I personally saw and evaluated the patient, and participated in the management and treatment plan.  Hazle Nordmann, PA-C Orthopedics

## 2023-10-17 ENCOUNTER — Ambulatory Visit (HOSPITAL_BASED_OUTPATIENT_CLINIC_OR_DEPARTMENT_OTHER): Payer: BC Managed Care – PPO | Admitting: Orthopaedic Surgery

## 2023-10-18 ENCOUNTER — Other Ambulatory Visit (HOSPITAL_COMMUNITY): Payer: Self-pay

## 2023-11-13 ENCOUNTER — Ambulatory Visit: Admitting: Orthopedic Surgery

## 2023-11-13 DIAGNOSIS — M19131 Post-traumatic osteoarthritis, right wrist: Secondary | ICD-10-CM

## 2023-11-13 DIAGNOSIS — M19132 Post-traumatic osteoarthritis, left wrist: Secondary | ICD-10-CM

## 2023-11-13 DIAGNOSIS — M19139 Post-traumatic osteoarthritis, unspecified wrist: Secondary | ICD-10-CM

## 2023-11-13 MED ORDER — BETAMETHASONE SOD PHOS & ACET 6 (3-3) MG/ML IJ SUSP
6.0000 mg | INTRAMUSCULAR | Status: AC | PRN
  Administered 2023-11-13: 6 mg via INTRA_ARTICULAR

## 2023-11-13 MED ORDER — LIDOCAINE HCL 1 % IJ SOLN
1.0000 mL | INTRAMUSCULAR | Status: AC | PRN
  Administered 2023-11-13: 1 mL

## 2023-11-13 NOTE — Progress Notes (Signed)
 Jason Maldonado - 68 y.o. male MRN [id number]  Date of birth: October 25, 1955  Office Visit Note: Visit Date: 11/13/2023 PCP: de [place], Raymond J, MD Referred by: Amador Cunas, PA*  Subjective: No chief complaint on file.  HPI: Jason Maldonado is a pleasant 68 y.o. male who presents today as a referral from Hazle Nordmann, [place] for ongoing bilateral SLAC arthritis.  He has been utilizing over-the-counter braces as needed with moderate relief.  Has not undergone any other formalized treatments for the ongoing wrist pain.  States that the pain at the bilateral wrist is equivalent.  Denies any frank numbness or tingling.  He was sent to me today for specific hand surgical evaluation.  Pertinent ROS were reviewed with the patient and found to be negative unless otherwise specified above in HPI.   Visit Reason: bilateral wrist pain Duration of symptoms: 6 weeks Hand dominance: right Occupation:aviation mechanic Diabetic: No Smoking: Yes Heart/Lung History: yes Blood Thinners:  baby aspirin  Prior Testing/EMG: 10/09/23 left, 06/14/23 right Injections (Date): none Treatments: splint Prior Surgery: none    Assessment & Plan: Visit Diagnoses:  1. Scapholunate advanced collapse of wrist, unspecified laterality     Plan: Extensive discussion was had with the patient today regarding his bilateral SLAC arthritis of the wrist.  I did explain the overall pathophysiology of this condition and the progressive nature of the arthritis throughout the wrist.  He does have significant proximal migration of the capitate with notable radial scaphoid joint space narrowing and bony contact of the capitate with the lunate.  I explained that this is advanced degeneration consistent with SLAC arthritis at bilateral wrists.  Given that he has not undergone any treatments outside of wrist bracing, I did explain that we can attempt to alleviate symptoms with more conservative measures such as cortisone  injections, topical medication, oral medication and activity modification.  I did explain that should symptoms remain refractory to conservative care, we could consider potential proximal row carpectomy versus 4 corner fusion in the future.  Given his smoking history, this would make him a non-ideal candidate for potential fusion, meaning that if this would be a treatment algorithm for [place] I would like him to try smoking cessation prior to surgical intervention.  For the time being, he would like to trial bilateral wrist injections which is appropriate.  Risk and benefits of the cortisone injection were discussed today, patient elected to proceed.  He will continue utilizing his over-the-counter wrist braces as needed.  Return in 3 months.  I spent 45 minutes in the care of this patient today including review of previous documentation, imaging obtained, face-to-face time discussing all options regarding treatment and documenting the encounter.    Follow-up: No follow-ups on file.   Meds & Orders: No orders of the defined types were placed in this encounter.  No orders of the defined types were placed in this encounter.    Procedures: Medium Joint Inj: bilateral radiocarpal on 11/13/2023 6:12 PM Indications: pain Details: 25 G needle Medications (Right): 1 mL lidocaine 1 %; 6 mg betamethasone acetate-betamethasone sodium phosphate 6 (3-3) MG/ML Medications (Left): 1 mL lidocaine 1 %; 6 mg betamethasone acetate-betamethasone sodium phosphate 6 (3-3) MG/ML Outcome: tolerated well, no immediate complications Procedure, treatment alternatives, risks and benefits explained, specific risks discussed.          Clinical History: No specialty comments available.  He reports that he has been smoking cigarettes. He started smoking about 48 years ago. He  has a 20 pack-year smoking history. He has been exposed to tobacco smoke. He has never used smokeless tobacco. No results for input(s): "HGBA1C",  "LABURIC" in the last 8760 hours.  Objective:   Vital Signs: There were no vitals taken for this visit.  Physical Exam  Gen: Well-appearing, in no acute distress; non-toxic CV: Regular Rate. Well-perfused. Warm.  Resp: Breathing unlabored on room air; no wheezing. Psych: Fluid speech in conversation; appropriate affect; normal thought process  Ortho Exam PHYSICAL EXAM:  General: Patient is well appearing and in no distress.  Skin and Muscle: No significant skin changes are apparent to hands.  Muscle bulk and contour normal, no signs of atrophy.     Range of Motion and Palpation Tests: Mobility is full about the elbows with flexion and extension.  Forearm supination and pronation are 85/85 bilaterally.  Wrist flexion/extension is 55/45 bilaterally.  Digital flexion and extension are full.  Thumb opposition is full to the base of the small fingers bilaterally.    No cords or nodules are palpated.  No triggering is observed.   Watson shift test is positive bilaterally both for clunk and pain.    Neurologic, Vascular, Motor: Sensation is intact to light touch in the median/radial/ulnar distributions.  Fingers pink and well perfused.  Capillary refill is brisk.      Lab Results  Component Value Date   HGBA1C 5.7 (H) 09/27/2012     Imaging: No results found.  Past Medical/Family/Surgical/Social History: Medications & Allergies reviewed per EMR, new medications updated. Patient Active Problem List   Diagnosis Date Noted   Wrist pain 06/05/2023   Statin myopathy 03/06/2023   Major depressive disorder, recurrent episode, moderate (HCC) 05/27/2018   GAD (generalized anxiety disorder) 05/27/2018   Bilateral shoulder pain 05/09/2016   Rotator cuff tendinitis 05/09/2016   Chronic systolic CHF (congestive heart failure) (HCC) 04/27/2015   Acute ST elevation myocardial infarction (STEMI) involving other coronary artery of inferior wall (HCC) 03/10/2015   PAF (paroxysmal atrial  fibrillation) (HCC) 03/10/2015   ST elevation myocardial infarction (STEMI) involving right coronary artery in recovery phase (HCC) 03/10/2015   Acute MI, inferior wall (HCC) 03/10/2015   LV dysfunction    Cardiogenic shock (HCC) 10/01/2012   ST elevation myocardial infarction (STEMI) of inferior wall (HCC) 09/27/2012   Hyperlipidemia 09/27/2012   Tobacco abuse 09/27/2012   CAD (coronary artery disease) 09/27/2012   Bipolar 1 disorder (HCC)    Past Medical History:  Diagnosis Date   Bipolar 1 disorder (HCC)    CAD (coronary artery disease)    a. STEMI 09/2012  single vessel occlusive CAD of the RCA s/p DES to mid RCA, EF 45%;  b. inferior STEMI 03/10/2015 DES to prox and mid RCA   Depression    Hyperlipidemia    LV dysfunction    a. 09/2012 Cath LV gram severe basal to midinferior wall hypokinesis, EF 45% and echo EF 55%.   PAF (paroxysmal atrial fibrillation) (HCC)    a. noted on admission 02/2015 in setting of inf stemi;  b. CHA2DS2VASc = 1.   Tobacco abuse    Family History  Problem Relation Age of Onset   CAD Father        deceased.   Past Surgical History:  Procedure Laterality Date   APPENDECTOMY     CARDIAC CATHETERIZATION N/A 03/10/2015   Procedure: Left Heart Cath and Coronary Angiography;  Surgeon: Lennette Bihari, MD;  Location: [facility] INVASIVE CV LAB;  Service: Cardiovascular;  Laterality:  N/A;   CARDIAC CATHETERIZATION  03/10/2015   Procedure: Coronary Stent Intervention;  Surgeon: Lennette Bihari, MD;  Location: [facility] INVASIVE CV LAB;  Service: Cardiovascular;;   INGUINAL HERNIA REPAIR Left 05/25/2021   Procedure: OPEN LEFT INGUINAL HERNIA AND EPIGASTRIC HERNIA REPAIR;  Surgeon: Berna Bue, MD;  Location: [facility] ORS;  Service: General;  Laterality: Left;   LEFT HEART CATHETERIZATION WITH CORONARY ANGIOGRAM N/A 09/27/2012   Procedure: LEFT HEART CATHETERIZATION WITH CORONARY ANGIOGRAM;  Surgeon: [name], MD;  Location: [facility] CATH LAB;  Service: Cardiovascular;   Laterality: N/A;   RIGHT/LEFT HEART CATH AND CORONARY ANGIOGRAPHY N/A 01/19/2023   Procedure: RIGHT/LEFT HEART CATH AND CORONARY ANGIOGRAPHY;  Surgeon: Swaziland, Peter M, MD;  Location: [facility] INVASIVE CV LAB;  Service: Cardiovascular;  Laterality: N/A;   TONSILLECTOMY     Social History   Occupational History   Occupation: Marine scientist  Tobacco Use   Smoking status: Every Day    Current packs/day: 0.00    Average packs/day: 0.5 packs/day for 40.0 years (20.0 ttl pk-yrs)    Types: Cigarettes    Start date: 04/15/1975    Last attempt to quit: 04/15/2015    Years since quitting: 8.5    Passive exposure: Current   Smokeless tobacco: Never  Vaping Use   Vaping status: Never Used  Substance and Sexual Activity   Alcohol use: Not Currently    Comment: social   Drug use: No   Sexual activity: Yes    Sidhant Helderman Trevor Mace, M.D. [facility], Hand Surgery

## 2023-11-22 DIAGNOSIS — R053 Chronic cough: Secondary | ICD-10-CM | POA: Diagnosis not present

## 2023-11-22 DIAGNOSIS — Z720 Tobacco use: Secondary | ICD-10-CM | POA: Diagnosis not present

## 2023-11-28 ENCOUNTER — Other Ambulatory Visit: Payer: Self-pay | Admitting: Cardiovascular Disease

## 2024-01-09 ENCOUNTER — Other Ambulatory Visit: Payer: Self-pay | Admitting: Cardiology

## 2024-01-11 ENCOUNTER — Inpatient Hospital Stay (HOSPITAL_COMMUNITY)
Admission: EM | Admit: 2024-01-11 | Discharge: 2024-01-16 | DRG: 277 | Disposition: A | Discharge status: Home / Self Care | Attending: Cardiovascular Disease | Admitting: Cardiovascular Disease

## 2024-01-11 ENCOUNTER — Other Ambulatory Visit: Payer: Self-pay

## 2024-01-11 ENCOUNTER — Ambulatory Visit: Attending: Cardiology | Admitting: Cardiology

## 2024-01-11 ENCOUNTER — Telehealth: Payer: Self-pay | Admitting: Cardiology

## 2024-01-11 ENCOUNTER — Encounter (HOSPITAL_COMMUNITY): Payer: Self-pay | Admitting: *Deleted

## 2024-01-11 VITALS — BP 108/72 | HR 163 | Ht 67.0 in | Wt 143.2 lb

## 2024-01-11 DIAGNOSIS — G720 Drug-induced myopathy: Secondary | ICD-10-CM

## 2024-01-11 DIAGNOSIS — F419 Anxiety disorder, unspecified: Secondary | ICD-10-CM | POA: Diagnosis present

## 2024-01-11 DIAGNOSIS — I470 Re-entry ventricular arrhythmia: Secondary | ICD-10-CM | POA: Diagnosis present

## 2024-01-11 DIAGNOSIS — E7800 Pure hypercholesterolemia, unspecified: Secondary | ICD-10-CM

## 2024-01-11 DIAGNOSIS — I2510 Atherosclerotic heart disease of native coronary artery without angina pectoris: Secondary | ICD-10-CM | POA: Diagnosis present

## 2024-01-11 DIAGNOSIS — I252 Old myocardial infarction: Secondary | ICD-10-CM

## 2024-01-11 DIAGNOSIS — E785 Hyperlipidemia, unspecified: Secondary | ICD-10-CM | POA: Diagnosis not present

## 2024-01-11 DIAGNOSIS — I110 Hypertensive heart disease with heart failure: Secondary | ICD-10-CM | POA: Diagnosis present

## 2024-01-11 DIAGNOSIS — I5042 Chronic combined systolic (congestive) and diastolic (congestive) heart failure: Secondary | ICD-10-CM | POA: Diagnosis not present

## 2024-01-11 DIAGNOSIS — F17210 Nicotine dependence, cigarettes, uncomplicated: Secondary | ICD-10-CM | POA: Diagnosis present

## 2024-01-11 DIAGNOSIS — I255 Ischemic cardiomyopathy: Secondary | ICD-10-CM | POA: Diagnosis not present

## 2024-01-11 DIAGNOSIS — I429 Cardiomyopathy, unspecified: Secondary | ICD-10-CM | POA: Diagnosis not present

## 2024-01-11 DIAGNOSIS — I4729 Other ventricular tachycardia: Secondary | ICD-10-CM | POA: Diagnosis not present

## 2024-01-11 DIAGNOSIS — Z7984 Long term (current) use of oral hypoglycemic drugs: Secondary | ICD-10-CM | POA: Diagnosis not present

## 2024-01-11 DIAGNOSIS — F319 Bipolar disorder, unspecified: Secondary | ICD-10-CM | POA: Diagnosis present

## 2024-01-11 DIAGNOSIS — I452 Bifascicular block: Secondary | ICD-10-CM | POA: Diagnosis not present

## 2024-01-11 DIAGNOSIS — Z7982 Long term (current) use of aspirin: Secondary | ICD-10-CM | POA: Diagnosis not present

## 2024-01-11 DIAGNOSIS — Z79899 Other long term (current) drug therapy: Secondary | ICD-10-CM

## 2024-01-11 DIAGNOSIS — I472 Ventricular tachycardia, unspecified: Secondary | ICD-10-CM | POA: Diagnosis not present

## 2024-01-11 DIAGNOSIS — Z7902 Long term (current) use of antithrombotics/antiplatelets: Secondary | ICD-10-CM | POA: Diagnosis not present

## 2024-01-11 DIAGNOSIS — R000 Tachycardia, unspecified: Secondary | ICD-10-CM

## 2024-01-11 DIAGNOSIS — Z8249 Family history of ischemic heart disease and other diseases of the circulatory system: Secondary | ICD-10-CM | POA: Diagnosis not present

## 2024-01-11 DIAGNOSIS — I480 Paroxysmal atrial fibrillation: Secondary | ICD-10-CM | POA: Diagnosis present

## 2024-01-11 DIAGNOSIS — I2119 ST elevation (STEMI) myocardial infarction involving other coronary artery of inferior wall: Secondary | ICD-10-CM

## 2024-01-11 DIAGNOSIS — R451 Restlessness and agitation: Secondary | ICD-10-CM | POA: Diagnosis not present

## 2024-01-11 DIAGNOSIS — T466X5D Adverse effect of antihyperlipidemic and antiarteriosclerotic drugs, subsequent encounter: Secondary | ICD-10-CM

## 2024-01-11 DIAGNOSIS — Z950 Presence of cardiac pacemaker: Secondary | ICD-10-CM | POA: Diagnosis not present

## 2024-01-11 LAB — CBC WITH DIFFERENTIAL/PLATELET
Abs Immature Granulocytes: 0.02 10*3/uL (ref 0.00–0.07)
Basophils Absolute: 0.1 10*3/uL (ref 0.0–0.1)
Basophils Relative: 1 %
Eosinophils Absolute: 0.3 10*3/uL (ref 0.0–0.5)
Eosinophils Relative: 4 %
HCT: 44.8 % (ref 39.0–52.0)
Hemoglobin: 14.3 g/dL (ref 13.0–17.0)
Immature Granulocytes: 0 %
Lymphocytes Relative: 32 %
Lymphs Abs: 2.5 10*3/uL (ref 0.7–4.0)
MCH: 26.2 pg (ref 26.0–34.0)
MCHC: 31.9 g/dL (ref 30.0–36.0)
MCV: 82.1 fL (ref 80.0–100.0)
Monocytes Absolute: 0.9 10*3/uL (ref 0.1–1.0)
Monocytes Relative: 12 %
Neutro Abs: 4 10*3/uL (ref 1.7–7.7)
Neutrophils Relative %: 51 %
Platelets: 269 10*3/uL (ref 150–400)
RBC: 5.46 MIL/uL (ref 4.22–5.81)
RDW: 15.7 % — ABNORMAL HIGH (ref 11.5–15.5)
WBC: 7.8 10*3/uL (ref 4.0–10.5)
nRBC: 0 % (ref 0.0–0.2)

## 2024-01-11 LAB — BASIC METABOLIC PANEL WITH GFR
Anion gap: 13 (ref 5–15)
BUN: 17 mg/dL (ref 8–23)
CO2: 22 mmol/L (ref 22–32)
Calcium: 9.3 mg/dL (ref 8.9–10.3)
Chloride: 102 mmol/L (ref 98–111)
Creatinine, Ser: 1.25 mg/dL — ABNORMAL HIGH (ref 0.61–1.24)
GFR, Estimated: >60 mL/min (ref >60)
Glucose, Bld: 93 mg/dL (ref 70–99)
Potassium: 4 mmol/L (ref 3.5–5.1)
Sodium: 137 mmol/L (ref 135–145)

## 2024-01-11 LAB — TROPONIN I (HIGH SENSITIVITY)
Troponin I (High Sensitivity): 32 ng/L — ABNORMAL HIGH (ref <18)
Troponin I (High Sensitivity): 33 ng/L — ABNORMAL HIGH (ref <18)

## 2024-01-11 LAB — TSH: TSH: 2.465 u[IU]/mL (ref 0.350–4.500)

## 2024-01-11 LAB — MAGNESIUM: Magnesium: 2 mg/dL (ref 1.7–2.4)

## 2024-01-11 MED ORDER — AMIODARONE HCL IN DEXTROSE 360-4.14 MG/200ML-% IV SOLN
60.0000 mg/h | INTRAVENOUS | Status: DC
  Administered 2024-01-11 – 2024-01-12 (×2): 60 mg/h via INTRAVENOUS
  Filled 2024-01-11 (×2): qty 200

## 2024-01-11 MED ORDER — AMIODARONE LOAD VIA INFUSION
150.0000 mg | Freq: Once | INTRAVENOUS | Status: AC
  Administered 2024-01-11: 150 mg via INTRAVENOUS
  Filled 2024-01-11: qty 83.34

## 2024-01-11 MED ORDER — HEPARIN SODIUM (PORCINE) 5000 UNIT/ML IJ SOLN
5000.0000 [IU] | Freq: Three times a day (TID) | INTRAMUSCULAR | Status: DC
  Filled 2024-01-11 (×2): qty 1

## 2024-01-11 MED ORDER — ACETAMINOPHEN 325 MG PO TABS: 650.0000 mg | ORAL_TABLET | ORAL | Status: DC | PRN

## 2024-01-11 MED ORDER — CARVEDILOL 6.25 MG PO TABS
6.2500 mg | ORAL_TABLET | Freq: Two times a day (BID) | ORAL | Status: DC
  Administered 2024-01-12 – 2024-01-16 (×9): 6.25 mg via ORAL
  Filled 2024-01-11: qty 2
  Filled 2024-01-11 (×8): qty 1

## 2024-01-11 MED ORDER — SACUBITRIL-VALSARTAN 24-26 MG PO TABS
1.0000 | ORAL_TABLET | Freq: Two times a day (BID) | ORAL | Status: DC
  Administered 2024-01-12 – 2024-01-16 (×9): 1 via ORAL
  Filled 2024-01-11 (×12): qty 1

## 2024-01-11 MED ORDER — EMPAGLIFLOZIN 10 MG PO TABS
10.0000 mg | ORAL_TABLET | Freq: Every day | ORAL | Status: DC
  Administered 2024-01-12 – 2024-01-14 (×3): 10 mg via ORAL
  Filled 2024-01-11 (×3): qty 1

## 2024-01-11 MED ORDER — SODIUM CHLORIDE 0.9% FLUSH
3.0000 mL | INTRAVENOUS | Status: DC | PRN
Start: 2024-01-11 — End: 2024-01-14

## 2024-01-11 MED ORDER — SODIUM CHLORIDE 0.9 % IV SOLN: 250.0000 mL | INTRAVENOUS | Status: AC | PRN

## 2024-01-11 MED ORDER — ASPIRIN 81 MG PO TBEC
81.0000 mg | DELAYED_RELEASE_TABLET | Freq: Every day | ORAL | Status: DC
  Administered 2024-01-12 – 2024-01-16 (×5): 81 mg via ORAL
  Filled 2024-01-11 (×5): qty 1

## 2024-01-11 MED ORDER — SODIUM CHLORIDE 0.9% FLUSH
3.0000 mL | Freq: Two times a day (BID) | INTRAVENOUS | Status: DC
  Administered 2024-01-13 – 2024-01-14 (×2): 3 mL via INTRAVENOUS

## 2024-01-11 MED ORDER — ONDANSETRON HCL 4 MG/2ML IJ SOLN
4.0000 mg | Freq: Four times a day (QID) | INTRAMUSCULAR | Status: DC | PRN
Start: 2024-01-11 — End: 2024-01-12

## 2024-01-11 MED ORDER — TICAGRELOR 60 MG PO TABS
60.0000 mg | ORAL_TABLET | Freq: Two times a day (BID) | ORAL | Status: DC
  Administered 2024-01-12 – 2024-01-13 (×4): 60 mg via ORAL
  Filled 2024-01-11 (×6): qty 1

## 2024-01-11 MED ORDER — AMIODARONE HCL IN DEXTROSE 360-4.14 MG/200ML-% IV SOLN
30.0000 mg/h | INTRAVENOUS | Status: DC
  Administered 2024-01-12 – 2024-01-15 (×8): 30 mg/h via INTRAVENOUS
  Filled 2024-01-11 (×7): qty 200

## 2024-01-11 NOTE — Progress Notes (Signed)
 CARDIOLOGY OFFICE NOTE  Date:  01/11/2024    Jason Maldonado Date of Birth: Apr 21, 1956 Medical Record #[id number]  PCP:  [place], Raymond J, MD  Cardiologist:  [place]    Chief Complaint  Patient presents with   Palpitations   Coronary Artery Disease   Congestive Heart Failure    History of Present Illness: Jason Maldonado is a 68 y.o. male who is seen for evaluation of tachycardia. He has a history of CHF and CAD.  He has a past medical history of hyperlipidemia, bipolar disorder, tobacco abuse, and a history of CAD status post previous inferior MI in 2014 with emergent PCI to the RCA.   He had second STEMI on 03/10/2015 as a inferior STEMI in the setting of RCA in-stent restenosis/occlusion. Cardiac catheterization on 03/10/2015 showed 100% stenosis of prox and mid RCA prox to a previously placed unknown stent, 4.038 mm Resolute DES stent postdilated to 4.41 mm proximally and 4.1 mm distally. 50% residual RPDA, 40% 1st RPLB, 40%distal RCA lesion. Post cath, he was placed on aspirin  and Brilinta . Echo EF 30-35%, basal to mid inferior akinesis, inferoseptal and inferolateral severe hypokinesis. He did have refractory hypotension that gradually improved. Repeat Echo in October 2016 showed inferior HK with EF 50-55%.   He contacted our office in February 2024 complaining of a "sinking feeling", dizziness, lightheadedness associated with palpitations. A 14-day monitor was placed that showed episodes of wide-complex tachycardia, some quite long > 5 hours. Was seen by Dr Arlester Ladd with EP. Echo done showing significant drop in EF to 20-25% with inferior and inferolateral AK.  He underwent outpatient R/LHC on 01/19/2023 revealed nonobstructive CAD, continue patency of stents in the RCA, normal LV filling pressures, normal right heart pressures, reduced cardiac output.  Optimization of heart failure therapy was recommended it was felt that his LV failure was likely due to negative remodeling from  prior infarcts.  He was started on Entresto  and carvedilol .  Jardiance  was also added to his medication regimen.  He was advised to follow-up with EP for consideration of ICD. He was seen by Dr Arlester Ladd in July - recommended repeat assessment of EF 90 days post optimization of meds. This showed persistently low EF 30-35%  He was seen by Pharm D for lipid management. Pitivastatin stopped due persistent myalgias. Did not improve. Nexlizet  also stopped. Discussed Repatha therapy. But he deferred cholesterol therapy.    He is seen as a work in today. States on Tuesday he developed tachycardia again. Felt lightheaded and dizzy. Has a funny feeling in his jaw. Fingers tingling. No increase in SOB or chest pain. States it lasted 5-6 hours then seemed to settle down. Has recurrent episodes since then.      Past Medical History:  Diagnosis Date   Bipolar 1 disorder (HCC)    CAD (coronary artery disease)    a. STEMI 09/2012  single vessel occlusive CAD of the RCA s/p DES to mid RCA, EF 45%;  b. inferior STEMI 03/10/2015 DES to prox and mid RCA   Depression    Hyperlipidemia    LV dysfunction    a. 09/2012 Cath LV gram severe basal to midinferior wall hypokinesis, EF 45% and echo EF 55%.   PAF (paroxysmal atrial fibrillation) (HCC)    a. noted on admission 02/2015 in setting of inf stemi;  b. CHA2DS2VASc = 1.   Tobacco abuse     Past Surgical History:  Procedure Laterality Date   APPENDECTOMY  CARDIAC CATHETERIZATION N/A 03/10/2015   Procedure: Left Heart Cath and Coronary Angiography;  Surgeon: Millicent Ally, MD;  Location: [facility] INVASIVE CV LAB;  Service: Cardiovascular;  Laterality: N/A;   CARDIAC CATHETERIZATION  03/10/2015   Procedure: Coronary Stent Intervention;  Surgeon: Millicent Ally, MD;  Location: [facility] INVASIVE CV LAB;  Service: Cardiovascular;;   INGUINAL HERNIA REPAIR Left 05/25/2021   Procedure: OPEN LEFT INGUINAL HERNIA AND EPIGASTRIC HERNIA REPAIR;  Surgeon: Adalberto Acton, MD;   Location: [facility] ORS;  Service: General;  Laterality: Left;   LEFT HEART CATHETERIZATION WITH CORONARY ANGIOGRAM N/A 09/27/2012   Procedure: LEFT HEART CATHETERIZATION WITH CORONARY ANGIOGRAM;  Surgeon: Arvis Miguez M [place], MD;  Location: [facility] CATH LAB;  Service: Cardiovascular;  Laterality: N/A;   RIGHT/LEFT HEART CATH AND CORONARY ANGIOGRAPHY N/A 01/19/2023   Procedure: RIGHT/LEFT HEART CATH AND CORONARY ANGIOGRAPHY;  Surgeon: [place], Kavin Weckwerth M, MD;  Location: [facility] INVASIVE CV LAB;  Service: Cardiovascular;  Laterality: N/A;   TONSILLECTOMY       Medications: Current Outpatient Medications  Medication Sig Dispense Refill   acetaminophen  (TYLENOL ) 650 MG CR tablet Take 650 mg by mouth every 8 (eight) hours as needed for pain.     aspirin  EC 81 MG EC tablet Take 1 tablet (81 mg total) by mouth daily.     carvedilol  (COREG ) 6.25 MG tablet TAKE 1 TABLET BY MOUTH TWICE A DAY 180 tablet 0   empagliflozin  (JARDIANCE ) 10 MG TABS tablet TAKE 1 TABLET BY MOUTH DAILY BEFORE BREAKFAST. 90 tablet 1   ibuprofen (ADVIL) 200 MG tablet Take 200 mg by mouth daily as needed (pain).     loratadine (CLARITIN) 10 MG tablet Take 10 mg by mouth daily as needed for allergies.     nitroGLYCERIN  (NITROSTAT ) 0.4 MG SL tablet Take as needed for chest pain. 1 tablet under tongue every 5 mins up to 3 tablets.After 3 tablets if chest pain call 911. 25 tablet 11   sacubitril -valsartan  (ENTRESTO ) 24-26 MG Take 1 tablet by mouth 2 (two) times daily. 180 tablet 3   ticagrelor  (BRILINTA ) 60 MG TABS tablet TAKE 1 TABLET BY MOUTH 2 TIMES DAILY. 180 tablet 2   No current facility-administered medications for this visit.    Allergies: No Known Allergies  Social History: The patient  reports that he has been smoking cigarettes. He started smoking about 48 years ago. He has a 20 pack-year smoking history. He has been exposed to tobacco smoke. He has never used smokeless tobacco. He reports that he does not currently use alcohol. He reports that  he does not use drugs.   Family History: The patient's family history includes CAD in his father.   Review of Systems: Please see the history of present illness.    All other systems are reviewed and negative.   Physical Exam: VS:  BP 108/72 (BP Location: Right Arm)   Pulse (!) 163   Ht 5\' 7"  (1.702 m)   Wt 143 lb 3.2 oz (65 kg)   SpO2 97%   BMI 22.43 kg/m  .  BMI Body mass index is 22.43 kg/m.  Wt Readings from Last 3 Encounters:  01/11/24 143 lb 3.2 oz (65 kg)  09/18/23 144 lb 11.2 oz (65.6 kg)  07/10/23 149 lb (67.6 kg)   GENERAL:  NAD HEENT:  PERRL, EOMI, sclera are clear. Oropharynx is clear. NECK:  No jugular venous distention, carotid upstroke brisk and symmetric, no bruits, no thyromegaly or adenopathy LUNGS:  Clear to auscultation bilaterally  CHEST:  Unremarkable HEART:  RRR, tachy,  PMI not displaced or sustained,S1 and S2 within normal limits, no S3, no S4: no clicks, no rubs, no murmurs ABD:  Soft, nontender. BS +, no masses or bruits. No hepatomegaly, no splenomegaly EXT:  2 + pulses throughout, no edema, no cyanosis no clubbing SKIN:  Warm and dry.  No rashes NEURO:  Alert and oriented x 3. Cranial nerves II through XII intact. PSYCH:  Cognitively intact     LABORATORY DATA: EKG Interpretation Date/Time:  Friday Jan 11 2024 15:28:23 EDT Ventricular Rate:  163 PR Interval:    QRS Duration:  104 QT Interval:  252 QTC Calculation: 414 R Axis:   -68  Text Interpretation: Ventricular tachycardia Left axis deviation Inferior-posterior infarct (cited on or before March 02, 2023 When compared with ECG of 02-Mar-2023 15:53, wide complex tachycardia is new Reconfirmed by [place], Veronnica Hennings [phone]) on 01/11/2024 4:18:49 PM      Lab Results  Component Value Date   WBC 8.2 01/17/2023   HGB 15.6 01/19/2023   HCT 46.0 01/19/2023   PLT 221 01/17/2023   GLUCOSE 77 02/06/2023   CHOL 182 05/08/2023   TRIG 105 05/08/2023   HDL 49 05/08/2023   LDLCALC 114 (H)  05/08/2023   ALT 12 05/08/2023   AST 22 05/08/2023   NA 140 02/06/2023   K 4.4 02/06/2023   CL 101 02/06/2023   CREATININE 1.46 (H) 02/06/2023   BUN 23 02/06/2023   CO2 21 02/06/2023   TSH 1.690 09/25/2022   PSA 1.30 08/03/2015   INR 1.0 01/17/2023   HGBA1C 5.7 (H) 09/27/2012    BNP (last 3 results) No results for input(s): "BNP" in the last 8760 hours.  ProBNP (last 3 results) No results for input(s): "PROBNP" in the last 8760 hours.   Other Studies Reviewed Today: Echo: 06/08/15:Study Conclusions  - Procedure narrative: Limited transthoracic echocardiography for   left ventricular function evaluation. Image quality was adequate. - Left ventricle: The cavity size was normal. Wall thickness was   normal. Systolic function was normal. The estimated ejection   fraction was in the range of 50% to 55%. There is inferior and   inferoseptal hypokinesis.  Impressions:  - Limited echo for LV function. There is inferior and inferoseptal   hypokinesis. LVEF has improved to 50-55%.  Event monitor 10/30/22: Study Highlights      Normal sinus rhythm   Frequent and sustained runs of wide complex tachycardia most c/w ventricular tachycardia. longest lasting 5 hours 47 minutes. Patient symptoms correlate with this.   Rare brief runs of SVT.     Patch Wear Time:  13 days and 23 hours (2024-02-17T16:17:09-498 to 2024-03-02T16:16:58-0500)   Patient had a min HR of 40 bpm, max HR of 210 bpm, and avg HR of 87 bpm. Predominant underlying rhythm was Sinus Rhythm. First Degree AV Block was present. 274 Ventricular Tachycardia runs occurred, the run with the fastest interval lasting 5 hours 47  mins with a max rate of 210 bpm (avg 161 bpm); the run with the fastest interval was also the longest. 5 Supraventricular Tachycardia runs occurred, the run with the fastest interval lasting 11 beats with a max rate of 156 bpm (avg 138 bpm); the run with  the fastest interval was also the longest. 1  episode(s) of AV Block (2nd) occurred, lasting a total of 2 secs. Ventricular Tachycardia was detected within +/- 45 seconds of symptomatic patient event(s). Isolated SVEs were frequent (8.3%, [id number]), SVE  Couplets were rare (<1.0%, 508), and SVE Triplets were rare (<1.0%, 546). Isolated VEs were occasional (1.8%, [id number]), VE Couplets were rare (<1.0%, 16), and VE Triplets were rare (<1.0%, 4). Ventricular Bigeminy and Trigeminy were present. MD  notification criteria for Ventricular Tachycardia met - notified Tonette Franco on 30 Oct 2022 at 10:03 am CT (MD).    Echo 01/05/23: IMPRESSIONS     1. Global hypokinesis with akinesis of the inferior and inferolateral  walls; overall severe LV dysfunction.   2. Left ventricular ejection fraction, by estimation, is 25 to 30%. The  left ventricle has severely decreased function. The left ventricle  demonstrates regional wall motion abnormalities (see scoring  diagram/findings for description). The left  ventricular internal cavity size was mildly dilated. There is moderate  left ventricular hypertrophy. Left ventricular diastolic parameters are  consistent with Grade I diastolic dysfunction (impaired relaxation).   3. Right ventricular systolic function is normal. The right ventricular  size is normal.   4. Left atrial size was mildly dilated.   5. The mitral valve is normal in structure. No evidence of mitral valve  regurgitation. No evidence of mitral stenosis.   6. The aortic valve is tricuspid. Aortic valve regurgitation is not  visualized. Aortic valve sclerosis is present, with no evidence of aortic  valve stenosis.   7. The inferior vena cava is normal in size with greater than 50%  respiratory variability, suggesting right atrial pressure of 3 mmHg.   Comparison(s): No prior Echocardiogram.   Cardiac cath 01/19/23:  RIGHT/LEFT HEART CATH AND CORONARY ANGIOGRAPHY   Conclusion      Mid RCA to Dist RCA lesion is 60% stenosed.   Mid RCA  lesion is 40% stenosed.   Prox LAD to Mid LAD lesion is 20% stenosed.   1st Diag lesion is 30% stenosed.   LV end diastolic pressure is normal.   Nonobstructive CAD. Continued patency of stents in the RCA Normal LV filling pressures.  Normal right heart pressures Cardiac output is reduced. CO 3.3 L/min with index 1.84   Plan: continue to optimize CHF therapy. Suspect LV failure due to negative remodeling from prior infarcts. Now on Entresto  and Coreg . Will add Jardiance . Suspect wide complex tachycardia is scar mediated from prior infarcts.   Echo 04/26/23: IMPRESSIONS     1. Left ventricular ejection fraction, by estimation, is 30 to 35%. The  left ventricle has moderately decreased function. The left ventricle  demonstrates global hypokinesis. Left ventricular diastolic parameters are  indeterminate.   2. Right ventricular systolic function is normal. The right ventricular  size is normal. Tricuspid regurgitation signal is inadequate for assessing  PA pressure.   3. Left atrial size was mildly dilated.   4. The mitral valve is grossly normal. Trivial mitral valve  regurgitation. No evidence of mitral stenosis.   5. The aortic valve is tricuspid. There is mild calcification of the  aortic valve. Aortic valve regurgitation is not visualized. No aortic  stenosis is present.   6. The inferior vena cava is normal in size with greater than 50%  respiratory variability, suggesting right atrial pressure of 3 mmHg.   Assessment/Plan:  1.  Wide complex tachycardia - on Ecg this is Ventricular tachycardia with RBBB, left axis pattern and AV dissociation. It is sustained.  Has seen Dr Mealor in the past for consideration of ICD and deferred. Given his prior MI and significant LV dysfunction suspect this is scar mediated arrhythmia. I am concerned given his underlying substrate  with poor EF that this is a life threatening arrhythmia and needs emergent hospitalization for management. Recommend  patient be transported by ambulance to ED. Instructed patient he should not drive. After discussion patient refused recommendation and states he will go home - that his rhythm will go away on its own. He is leaving Against medical advice and signed waver to that effect.    2. CAD s/p inferior MI with stenting in 2014. Recurrent STEMI in July 2016 with instent restenosis/occlusion. S/p repeat DES. On DAPT. Would favor continuing this long term. Continue Brilinta  60 mg bid and ASA 81 mg daily. Continue low dose beta blocker. Unable to tolerate higher dose due to low BP in past. Cardiac cath last June showed nonobstructive CAD. Normal filling pressures.   3. Chronic combined  systolic/diastolic  CHF. EF 20-25%. Now on Entresto  24/26 mg bid, Coreg  and Jardiance . EF improved to 30-35%. Has had hypotension in the past so will continue current dose.    4. Hyperlipidemia. Intolerant of lipitor , Livalo , and Nexlizet . LDL 114 off meds. Does not wish to pursue other therapy (Repatha) at this time.   5.. Bipolar disorder.   6. Tobacco abuse. Encourage efforts at complete smoking cessation.   Current medicines are reviewed with the patient today.  The patient does not have concerns regarding medicines other than what has been noted above.  The following changes have been made:  See above.  Labs/ tests ordered today include:    Orders Placed This Encounter  Procedures   EKG 12-Lead     Disposition:   FU TBD. Will try and arrange early follow up with Dr Arlester Ladd but encouraged patient to seek treatment at the Emergency department. Reiterated recommendation against driving.    Signed: Dietrick Barris [place] MD, FACC    01/11/2024 5:20 PM

## 2024-01-11 NOTE — H&P (Signed)
 Cardiology Admission History and Physical   Patient ID: Jason Maldonado MRN: [id number]; DOB: 1956-07-19   Admission date: 01/11/2024  PCP:  de [place], Raymond J, MD   [facility] Providers Cardiologist:  Peter Swaziland, MD  Electrophysiologist:  Efraim Grange, MD      Chief Complaint:  palpitations  Patient Profile: Jason Maldonado is a 68 y.o. male with CHF, CAD bipolar who is being seen 01/11/2024 for the evaluation of ventricular tachycardia.  History of Present Illness: Jason Maldonado is a 68 y.o. male who is seen for evaluation of tachycardia. He has a history of CHF and CAD.  He has a past medical history of hyperlipidemia, bipolar disorder, tobacco abuse, and a history of CAD status post previous inferior MI in 2014 with emergent PCI to the RCA.   He had second STEMI on 03/10/2015 as a inferior STEMI in the setting of RCA in-stent restenosis/occlusion. Cardiac catheterization on 03/10/2015 showed 100% stenosis of prox and mid RCA prox to a previously placed unknown stent, 4.038 mm Resolute DES stent postdilated to 4.41 mm proximally and 4.1 mm distally. 50% residual RPDA, 40% 1st RPLB, 40%distal RCA lesion. Post cath, he was placed on aspirin  and Brilinta . Echo EF 30-35%, basal to mid inferior akinesis, inferoseptal and inferolateral severe hypokinesis. He did have refractory hypotension that gradually improved. Repeat Echo in October 2016 showed inferior HK with EF 50-55%.    He underwent outpatient R/LHC on 01/19/2023 revealed nonobstructive CAD, continue patency of stents in the RCA, normal LV filling pressures, normal right heart pressures, reduced cardiac output.  Optimization of heart failure therapy was recommended it was felt that his LV failure was likely due to negative remodeling from prior infarcts.  He was started on Entresto  and carvedilol .  Jardiance  was also added to his medication regimen.  He was advised to follow-up with EP for consideration of ICD. He was seen by  Dr Arlester Ladd in July - recommended repeat assessment of EF 90 days post optimization of meds. This showed persistently low EF 30-35%   Since then he has noted that he had ongoing intermittent episodes of palpitations These last minutes and sometimes hours at a time. He had episode in the office today with Dr. Swaziland and captured on ECG.  Despite strong recommendation for patient to come to the emergency department immediately, he left the clinic office AMA.  He subsequently arrived several hours later to the emergency department in a similar state.  On arrival here, he was still in VT going in about 170.  He was started on amiodarone  which reverted him to sinus rhythm.  This time he was with the pulse.   Past Medical History:  Diagnosis Date   Bipolar 1 disorder (HCC)    CAD (coronary artery disease)    a. STEMI 09/2012  single vessel occlusive CAD of the RCA s/p DES to mid RCA, EF 45%;  b. inferior STEMI 03/10/2015 DES to prox and mid RCA   Depression    Hyperlipidemia    LV dysfunction    a. 09/2012 Cath LV gram severe basal to midinferior wall hypokinesis, EF 45% and echo EF 55%.   PAF (paroxysmal atrial fibrillation) (HCC)    a. noted on admission 02/2015 in setting of inf stemi;  b. CHA2DS2VASc = 1.   Tobacco abuse    Past Surgical History:  Procedure Laterality Date   APPENDECTOMY     CARDIAC CATHETERIZATION N/A 03/10/2015   Procedure: Left Heart Cath and Coronary  Angiography;  Surgeon: Millicent Ally, MD;  Location: [facility] INVASIVE CV LAB;  Service: Cardiovascular;  Laterality: N/A;   CARDIAC CATHETERIZATION  03/10/2015   Procedure: Coronary Stent Intervention;  Surgeon: Millicent Ally, MD;  Location: [facility] INVASIVE CV LAB;  Service: Cardiovascular;;   INGUINAL HERNIA REPAIR Left 05/25/2021   Procedure: OPEN LEFT INGUINAL HERNIA AND EPIGASTRIC HERNIA REPAIR;  Surgeon: Adalberto Acton, MD;  Location: [facility] ORS;  Service: General;  Laterality: Left;   LEFT HEART CATHETERIZATION WITH CORONARY  ANGIOGRAM N/A 09/27/2012   Procedure: LEFT HEART CATHETERIZATION WITH CORONARY ANGIOGRAM;  Surgeon: [name], MD;  Location[facility] CATH LAB;  Service: Cardiovascular;  Laterality: N/A;   RIGHT/LEFT HEART CATH AND CORONARY ANGIOGRAPHY N/A 01/19/2023   Procedure: RIGHT/LEFT HEART CATH AND CORONARY ANGIOGRAPHY;  Surgeon: Swaziland, Peter M, MD;  Location: [facility] INVASIVE CV LAB;  Service: Cardiovascular;  Laterality: N/A;   TONSILLECTOMY       Medications Prior to Admission: Prior to Admission medications   Medication Sig Start Date End Date Taking? Authorizing Provider  acetaminophen  (TYLENOL ) 650 MG CR tablet Take 650 mg by mouth every 8 (eight) hours as needed for pain.    [provider]  aspirin  EC 81 MG EC tablet Take 1 tablet (81 mg total) by mouth daily. 10/02/12   Hope, Jessica A, PA-C  carvedilol  (COREG ) 6.25 MG tablet TAKE 1 TABLET BY MOUTH TWICE A DAY 11/28/23   Mealor, Augustus E, MD  empagliflozin  (JARDIANCE ) 10 MG TABS tablet TAKE 1 TABLET BY MOUTH DAILY BEFORE BREAKFAST. 01/09/24   Swaziland, Peter M, MD  ibuprofen (ADVIL) 200 MG tablet Take 200 mg by mouth daily as needed (pain).    [provider]  loratadine (CLARITIN) 10 MG tablet Take 10 mg by mouth daily as needed for allergies.    [provider]  nitroGLYCERIN  (NITROSTAT ) 0.4 MG SL tablet Take as needed for chest pain. 1 tablet under tongue every 5 mins up to 3 tablets.After 3 tablets if chest pain call 911. 09/25/22   Jude Norton, NP  sacubitril -valsartan  (ENTRESTO ) 24-26 MG Take 1 tablet by mouth 2 (two) times daily. 01/17/23   Swaziland, Peter M, MD  ticagrelor  (BRILINTA ) 60 MG TABS tablet TAKE 1 TABLET BY MOUTH 2 TIMES DAILY. 05/31/23   Swaziland, Peter M, MD     Allergies:   No Known Allergies  Social History:   Social History   Socioeconomic History   Marital status: Married    Spouse name: Not on file   Number of children: Not on file   Years of education: Not on file   Highest education level: Not  on file  Occupational History   Occupation: Marine scientist  Tobacco Use   Smoking status: Every Day    Current packs/day: 0.00    Average packs/day: 0.5 packs/day for 40.0 years (20.0 ttl pk-yrs)    Types: Cigarettes    Start date: 04/15/1975    Last attempt to quit: 04/15/2015    Years since quitting: 8.7    Passive exposure: Current   Smokeless tobacco: Never  Vaping Use   Vaping status: Never Used  Substance and Sexual Activity   Alcohol use: Not Currently    Comment: social   Drug use: No   Sexual activity: Yes  Other Topics Concern   Not on file  Social History Narrative   Lives in [place] by himself.  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  He does not routinely exercise.   Married.  Education: Academic librarian.   Exercise: Yes   Social Drivers of Corporate investment banker Strain: Not on file  Food Insecurity: Not on file  Transportation Needs: Not on file  Physical Activity: Not on file  Stress: Not on file  Social Connections: Not on file  Intimate Partner Violence: Not on file     Family History:   The patient's family history includes CAD in his father.    ROS:  Please see the history of present illness.  All other ROS reviewed and negative.     Physical Exam/Data: Vitals:   01/11/24 2054 01/11/24 2057 01/11/24 2100 01/11/24 2145  BP: (!) 147/100 (!) 143/102 (!) 147/93 122/86  Pulse: 91 91 84 79  Resp: 11 15 15 19   Temp:      TempSrc:      SpO2: 100% 100% 100% 97%  Weight:      Height:       No intake or output data in the 24 hours ending 01/11/24 2245    01/11/2024    8:38 PM 01/11/2024    3:03 PM 09/18/2023    1:13 PM  Last 3 Weights  Weight (lbs) 143 lb 4.8 oz 143 lb 3.2 oz 144 lb 11.2 oz  Weight (kg) 65 kg 64.955 kg 65.635 kg     Body mass index is 22.44 kg/m.  General:  Well nourished, well developed, in no acute distress HEENT: normal Neck: no JVD Vascular: No carotid bruits; Distal pulses 2+ bilaterally   Cardiac:  normal S1, S2; RRR; no  murmur  Lungs:  clear to auscultation bilaterally, no wheezing, rhonchi or rales  Abd: soft, nontender, no hepatomegaly  Ext: no edema Musculoskeletal:  No deformities, BUE and BLE strength normal and equal Skin: warm and dry  Neuro:  CNs 2-12 intact, no focal abnormalities noted Psych:  Normal affect   EKG:  The ECG that was done  was personally reviewed and demonstrates VT.  Subsequent revealed sinus rhythm  Relevant CV Studies: None  Laboratory Data: High Sensitivity Troponin:   Recent Labs  Lab 01/11/24 2047  TROPONINIHS 32*      Chemistry Recent Labs  Lab 01/11/24 2047  NA 137  K 4.0  CL 102  CO2 22  GLUCOSE 93  BUN 17  CREATININE 1.25*  CALCIUM  9.3  MG 2.0  GFRNONAA >60  ANIONGAP 13    No results for input(s): "PROT", "ALBUMIN", "AST", "ALT", "ALKPHOS", "BILITOT" in the last 168 hours. Lipids No results for input(s): "CHOL", "TRIG", "HDL", "LABVLDL", "LDLCALC", "CHOLHDL" in the last 168 hours. Hematology Recent Labs  Lab 01/11/24 2047  WBC 7.8  RBC 5.46  HGB 14.3  HCT 44.8  MCV 82.1  MCH 26.2  MCHC 31.9  RDW 15.7*  PLT 269   Thyroid   Recent Labs  Lab 01/11/24 2047  TSH 2.465   BNPNo results for input(s): "BNP", "PROBNP" in the last 168 hours.  DDimer No results for input(s): "DDIMER" in the last 168 hours.  Radiology/Studies:  No results found.   Assessment and Plan: Ventricular tachycardia.  Does not appear to be active ischemia but likely scar mediated.  Continue amiodarone  infusion and transition to p.o. CAD s/p inferior MI with PCI in 2014.  Saw Dr. Swaziland in clinic today.  Favors long-term antiplatelet.  Continue Brilinta  60 mg twice daily and aspirin  81 mg daily.  Continue low-dose beta-blocker. Chronic combined systolic and diastolic CHF.  Will repeat echo this hospitalization.  Continue Entresto  and Jardiance . HLD.  Has issues with statins and they have discussed Repatha in clinic. Bipolar disorder.  Need to discuss medications  appears he is on some but wife notes that he has not taken them. Tobacco abuse.  Ongoing strong encouragement to set tobacco cessation.  Risk Assessment/Risk Scores:         Code Status: Full Code  Severity of Illness: The appropriate patient status for this patient is INPATIENT. Inpatient status is judged to be reasonable and necessary in order to provide the required intensity of service to ensure the patient's safety. The patient's presenting symptoms, physical exam findings, and initial radiographic and laboratory data in the context of their chronic comorbidities is felt to place them at high risk for further clinical deterioration. Furthermore, it is not anticipated that the patient will be medically stable for discharge from the hospital within 2 midnights of admission.   * I certify that at the point of admission it is my clinical judgment that the patient will require inpatient hospital care spanning beyond 2 midnights from the point of admission due to high intensity of service, high risk for further deterioration and high frequency of surveillance required.*  For questions or updates, please contact [facility] Please consult [email or web address] for contact info under     Signed, Bert Britain, MD  01/11/2024 10:45 PM

## 2024-01-11 NOTE — ED Notes (Signed)
 ED TO INPATIENT HANDOFF REPORT  ED Nurse Name and Phone #: Nerissa Bannister [phone]  S Name/Age/Gender Jason Maldonado Demond 68 y.o. male Room/Bed: 030C/030C  Code Status   Code Status: Full Code  Home/SNF/Other Home Patient oriented to: self, place, time, and situation Is this baseline? Yes   Triage Complete: Triage complete  Chief Complaint VT (ventricular tachycardia) (HCC) [I47.20]  Triage Note The pt reports that he has had tachycardia all day  he was in the heart doctors office this afternoon and they wanted him to come here but he refused to come then   Allergies No Known Allergies  Level of Care/Admitting Diagnosis ED Disposition     ED Disposition  Admit   Condition  --   Comment  Hospital Area: [facility] [[id number]]  Level of Care: Progressive [102]  Admit to Progressive based on following criteria: CARDIOVASCULAR & THORACIC of moderate stability with acute coronary syndrome symptoms/low risk myocardial infarction/hypertensive urgency/arrhythmias/heart failure potentially compromising stability and stable post cardiovascular intervention patients.  May admit patient to [facility] or [facility] if equivalent level of care is available:: No  Covid Evaluation: Asymptomatic - no recent exposure (last 10 days) testing not required  Diagnosis: VT (ventricular tachycardia) [facility]) [981191]  Admitting Physician: NARCISSE JR, DENNIS [id number]  Attending Physician: Bridgette Campus [id number]  Certification:: I certify this patient will need inpatient services for at least 2 midnights  Expected Medical Readiness: 01/15/2024          B Medical/Surgery History Past Medical History:  Diagnosis Date   Bipolar 1 disorder (HCC)    CAD (coronary artery disease)    a. STEMI 09/2012  single vessel occlusive CAD of the RCA s/p DES to mid RCA, EF 45%;  b. inferior STEMI 03/10/2015 DES to prox and mid RCA   Depression    Hyperlipidemia    LV dysfunction    a. 09/2012 Cath  LV gram severe basal to midinferior wall hypokinesis, EF 45% and echo EF 55%.   PAF (paroxysmal atrial fibrillation) (HCC)    a. noted on admission 02/2015 in setting of inf stemi;  b. CHA2DS2VASc = 1.   Tobacco abuse    Past Surgical History:  Procedure Laterality Date   APPENDECTOMY     CARDIAC CATHETERIZATION N/A 03/10/2015   Procedure: Left Heart Cath and Coronary Angiography;  Surgeon: Millicent Ally, MD;  Location: [facility] INVASIVE CV LAB;  Service: Cardiovascular;  Laterality: N/A;   CARDIAC CATHETERIZATION  03/10/2015   Procedure: Coronary Stent Intervention;  Surgeon: Millicent Ally, MD;  Location: [facility] INVASIVE CV LAB;  Service: Cardiovascular;;   INGUINAL HERNIA REPAIR Left 05/25/2021   Procedure: OPEN LEFT INGUINAL HERNIA AND EPIGASTRIC HERNIA REPAIR;  Surgeon: Adalberto Acton, MD;  Location: [facility] ORS;  Service: General;  Laterality: Left;   LEFT HEART CATHETERIZATION WITH CORONARY ANGIOGRAM N/A 09/27/2012   Procedure: LEFT HEART CATHETERIZATION WITH CORONARY ANGIOGRAM;  Surgeon: [name], MD;  Location: [facility] CATH LAB;  Service: Cardiovascular;  Laterality: N/A;   RIGHT/LEFT HEART CATH AND CORONARY ANGIOGRAPHY N/A 01/19/2023   Procedure: RIGHT/LEFT HEART CATH AND CORONARY ANGIOGRAPHY;  Surgeon: Swaziland, Peter M, MD;  Location: [facility] INVASIVE CV LAB;  Service: Cardiovascular;  Laterality: N/A;   TONSILLECTOMY       A IV Location/Drains/Wounds Patient Lines/Drains/Airways Status     Active Line/Drains/Airways     Name Placement date Placement time Site Days   Peripheral IV 01/11/24 20 G 1" Anterior;Proximal;Right Forearm 01/11/24  2057  Forearm  less than 1            Intake/Output Last 24 hours No intake or output data in the 24 hours ending 01/11/24 2256  Labs/Imaging Results for orders placed or performed during the hospital encounter of 01/11/24 (from the past 48 hours)  Basic metabolic panel     Status: Abnormal   Collection Time: 01/11/24  8:47 PM  Result Value Ref  Range   Sodium 137 135 - 145 mmol/L   Potassium 4.0 3.5 - 5.1 mmol/L   Chloride 102 98 - 111 mmol/L   CO2 22 22 - 32 mmol/L   Glucose, Bld 93 70 - 99 mg/dL    Comment: Glucose reference range applies only to samples taken after fasting for at least 8 hours.   BUN 17 8 - 23 mg/dL   Creatinine, Ser 1.61 (H) 0.61 - 1.24 mg/dL   Calcium  9.3 8.9 - 10.3 mg/dL   GFR, Estimated >09 >60 mL/min    Comment: (NOTE) Calculated using the CKD-EPI Creatinine Equation (2021)    Anion gap 13 5 - 15    Comment: Performed at [facility], [address] [place], [place] [address]  Troponin I (High Sensitivity)     Status: Abnormal   Collection Time: 01/11/24  8:47 PM  Result Value Ref Range   Troponin I (High Sensitivity) 32 (H) <18 ng/L    Comment: (NOTE) Elevated high sensitivity troponin I (hsTnI) values and significant  changes across serial measurements may suggest ACS but many other  chronic and acute conditions are known to elevate hsTnI results.  Refer to the "Links" section for chest pain algorithms and additional  guidance. Performed at [facility], [address], [place], [place] [address]   TSH     Status: None   Collection Time: 01/11/24  8:47 PM  Result Value Ref Range   TSH 2.465 0.350 - 4.500 uIU/mL    Comment: Performed by a 3rd Generation assay with a functional sensitivity of <=0.01 uIU/mL. Performed at [facility], [address] [place], [place] [address]   Magnesium     Status: None   Collection Time: 01/11/24  8:47 PM  Result Value Ref Range   Magnesium 2.0 1.7 - 2.4 mg/dL    Comment: Performed at [facility], [address] [place], [place] [address]  CBC with Differential     Status: Abnormal   Collection Time: 01/11/24  8:47 PM  Result Value Ref Range   WBC 7.8 4.0 - 10.5 K/uL   RBC 5.46 4.22 - 5.81 MIL/uL   Hemoglobin 14.3 13.0 - 17.0 g/dL   HCT 08.6 57.8 - 46.9 %   MCV 82.1 80.0 - 100.0 fL   MCH 26.2 26.0 - 34.0 pg   MCHC 31.9  30.0 - 36.0 g/dL   RDW 62.9 (H) 52.8 - 41.3 %   Platelets 269 150 - 400 K/uL   nRBC 0.0 0.0 - 0.2 %   Neutrophils Relative % 51 %   Neutro Abs 4.0 1.7 - 7.7 K/uL   Lymphocytes Relative 32 %   Lymphs Abs 2.5 0.7 - 4.0 K/uL   Monocytes Relative 12 %   Monocytes Absolute 0.9 0.1 - 1.0 K/uL   Eosinophils Relative 4 %   Eosinophils Absolute 0.3 0.0 - 0.5 K/uL   Basophils Relative 1 %   Basophils Absolute 0.1 0.0 - 0.1 K/uL   Immature Granulocytes 0 %   Abs  Immature Granulocytes 0.02 0.00 - 0.07 K/uL    Comment: Performed at [facility], [address], [place], [place] [address]   No results found.  Pending Labs Unresulted Labs (From admission, onward)     Start     Ordered   01/12/24 0500  Basic metabolic panel  Daily,   R     Comments: As Scheduled for 5 days    01/11/24 2245   01/12/24 0500  Brain natriuretic peptide  Tomorrow morning,   R        01/11/24 2245   01/12/24 0500  Magnesium  Tomorrow morning,   R        01/11/24 2245            Vitals/Pain Today's Vitals   01/11/24 2057 01/11/24 2100 01/11/24 2145 01/11/24 2251  BP: (!) 143/102 (!) 147/93 122/86   Pulse: 91 84 79   Resp: 15 15 19    Temp:      TempSrc:      SpO2: 100% 100% 97%   Weight:      Height:      PainSc:   0-No pain 0-No pain    Isolation Precautions No active isolations  Medications Medications  amiodarone (NEXTERONE) 1.8 mg/mL load via infusion 150 mg (150 mg Intravenous Bolus from Bag 01/11/24 2121)    Followed by  amiodarone (NEXTERONE PREMIX) 360-4.14 MG/200ML-% (1.8 mg/mL) IV infusion (60 mg/hr Intravenous New Bag/Given 01/11/24 2120)    Followed by  amiodarone (NEXTERONE PREMIX) 360-4.14 MG/200ML-% (1.8 mg/mL) IV infusion (has no administration in time range)  aspirin  EC tablet 81 mg (has no administration in time range)  carvedilol  (COREG ) tablet 6.25 mg (has no administration in time range)  sacubitril -valsartan  (ENTRESTO ) 24-26 mg per tablet (has no administration in  time range)  empagliflozin  (JARDIANCE ) tablet 10 mg (has no administration in time range)  ticagrelor  (BRILINTA ) tablet 60 mg (has no administration in time range)  sodium chloride  flush (NS) 0.9 % injection 3 mL (has no administration in time range)  sodium chloride  flush (NS) 0.9 % injection 3 mL (has no administration in time range)  0.9 %  sodium chloride  infusion (has no administration in time range)  acetaminophen  (TYLENOL ) tablet 650 mg (has no administration in time range)  ondansetron  (ZOFRAN ) injection 4 mg (has no administration in time range)  heparin  injection 5,000 Units (has no administration in time range)    Mobility walks     Focused Assessments Cardiac Assessment Handoff:  Cardiac Rhythm: Normal sinus rhythm Lab Results  Component Value Date   CKTOTAL 360 (H) 09/27/2012   CKMB 23.2 (HH) 09/27/2012   TROPONINI 45.86 (HH) 03/11/2015   No results found for: "DDIMER" Does the Patient currently have chest pain? No    R Recommendations: See Admitting Provider Note  Report given to:   Additional Notes: Amiodarone infusing

## 2024-01-11 NOTE — Telephone Encounter (Signed)
 [place] husband called and told her about today's visit with Dr.Jordan.Stated husband is sick.He stopped taking his antidepressants and few weeks ago.Stated she told him he needs to go to ED but he refuses.She will try to get him to go this weekend.

## 2024-01-11 NOTE — ED Provider Notes (Signed)
 Agency EMERGENCY DEPARTMENT AT [facility] Provider Note  CSN: [id number] Arrival date & time: 01/11/24 2026  Chief Complaint(s) Tachycardia  HPI Jason Maldonado is a 68 y.o. male with past medical history as below, significant for CAD, bipolar 1, HLD, PAF who presents to the ED with complaint of vtach  Pt was seen at Dr Swaziland office earlier today, was in vtach, advised to come to ED, pt refused and left AMA. Presents tonight for evaluation. He is asymptomatic, vtach noted in triage EKG. Brought urgently to treatment.   He follows w/ Dr Swaziland and Dr Arlester Ladd.   Pt has no complaints, no cp or palpitations, no dib.    Past Medical History Past Medical History:  Diagnosis Date   Bipolar 1 disorder (HCC)    CAD (coronary artery disease)    a. STEMI 09/2012  single vessel occlusive CAD of the RCA s/p DES to mid RCA, EF 45%;  b. inferior STEMI 03/10/2015 DES to prox and mid RCA   Depression    Hyperlipidemia    LV dysfunction    a. 09/2012 Cath LV gram severe basal to midinferior wall hypokinesis, EF 45% and echo EF 55%.   PAF (paroxysmal atrial fibrillation) (HCC)    a. noted on admission 02/2015 in setting of inf stemi;  b. CHA2DS2VASc = 1.   Tobacco abuse    Patient Active Problem List   Diagnosis Date Noted   VT (ventricular tachycardia) (HCC) 01/11/2024   Wrist pain 06/05/2023   Statin myopathy 03/06/2023   Major depressive disorder, recurrent episode, moderate (HCC) 05/27/2018   GAD (generalized anxiety disorder) 05/27/2018   Bilateral shoulder pain 05/09/2016   Rotator cuff tendinitis 05/09/2016   Chronic systolic CHF (congestive heart failure) (HCC) 04/27/2015   Acute ST elevation myocardial infarction (STEMI) involving other coronary artery of inferior wall (HCC) 03/10/2015   PAF (paroxysmal atrial fibrillation) (HCC) 03/10/2015   ST elevation myocardial infarction (STEMI) involving right coronary artery in recovery phase (HCC) 03/10/2015   Acute MI, inferior  wall (HCC) 03/10/2015   LV dysfunction    Cardiogenic shock (HCC) 10/01/2012   ST elevation myocardial infarction (STEMI) of inferior wall (HCC) 09/27/2012   Hyperlipidemia 09/27/2012   Tobacco abuse 09/27/2012   CAD (coronary artery disease) 09/27/2012   Bipolar 1 disorder (HCC)    Home Medication(s) Prior to Admission medications   Medication Sig Start Date End Date Taking? Authorizing Provider  acetaminophen  (TYLENOL ) 650 MG CR tablet Take 650 mg by mouth every 8 (eight) hours as needed for pain.   Yes [provider]  aspirin  EC 81 MG EC tablet Take 1 tablet (81 mg total) by mouth daily. 10/02/12  Yes Hope, Jessica A, PA-C  carvedilol  (COREG ) 6.25 MG tablet TAKE 1 TABLET BY MOUTH TWICE A DAY 11/28/23  Yes Mealor, Augustus E, MD  docusate sodium  (COLACE) 100 MG capsule Take 100 mg by mouth 2 (two) times daily.   Yes [provider]  empagliflozin  (JARDIANCE ) 10 MG TABS tablet TAKE 1 TABLET BY MOUTH DAILY BEFORE BREAKFAST. 01/09/24  Yes Swaziland, Peter M, MD  loratadine (CLARITIN) 10 MG tablet Take 10 mg by mouth daily as needed for allergies.   Yes [provider]  nitroGLYCERIN  (NITROSTAT ) 0.4 MG SL tablet Take as needed for chest pain. 1 tablet under tongue every 5 mins up to 3 tablets.After 3 tablets if chest pain call 911. 09/25/22  Yes Monge, Nelva Bang, NP  sacubitril -valsartan  (ENTRESTO ) 24-26 MG Take 1 tablet by  mouth 2 (two) times daily. 01/17/23  Yes Sw[place], Peter M, MD  ticagrelor  (BRILINTA ) 60 MG TABS tablet TAKE 1 TABLET BY MOUTH 2 TIMES DAILY. 05/31/23  Yes Swaziland, Peter M, MD                                                                                                                                    Past Surgical History Past Surgical History:  Procedure Laterality Date   APPENDECTOMY     CARDIAC CATHETERIZATION N/A 03/10/2015   Procedure: Left Heart Cath and Coronary Angiography;  Surgeon: Millicent Ally, MD;  Location: [facility] INVASIVE CV LAB;  Service:  Cardiovascular;  Laterality: N/A;   CARDIAC CATHETERIZATION  03/10/2015   Procedure: Coronary Stent Intervention;  Surgeon: Millicent Ally, MD;  Location: [facility] INVASIVE CV LAB;  Service: Cardiovascular;;   INGUINAL HERNIA REPAIR Left 05/25/2021   Procedure: OPEN LEFT INGUINAL HERNIA AND EPIGASTRIC HERNIA REPAIR;  Surgeon: Adalberto Acton, MD;  Location: [facility] ORS;  Service: General;  Laterality: Left;   LEFT HEART CATHETERIZATION WITH CORONARY ANGIOGRAM N/A 09/27/2012   Procedure: LEFT HEART CATHETERIZATION WITH CORONARY ANGIOGRAM;  Surgeon: [name], MD;  Location: [facility] CATH LAB;  Service: Cardiovascular;  Laterality: N/A;   RIGHT/LEFT HEART CATH AND CORONARY ANGIOGRAPHY N/A 01/19/2023   Procedure: RIGHT/LEFT HEART CATH AND CORONARY ANGIOGRAPHY;  Surgeon: Swaziland, Peter M, MD;  Location: [facility] INVASIVE CV LAB;  Service: Cardiovascular;  Laterality: N/A;   TONSILLECTOMY     Family History Family History  Problem Relation Age of Onset   CAD Father        deceased.    Social History Social History   Tobacco Use   Smoking status: Every Day    Current packs/day: 0.00    Average packs/day: 0.5 packs/day for 40.0 years (20.0 ttl pk-yrs)    Types: Cigarettes    Start date: 04/15/1975    Last attempt to quit: 04/15/2015    Years since quitting: 8.7    Passive exposure: Current   Smokeless tobacco: Never  Vaping Use   Vaping status: Never Used  Substance Use Topics   Alcohol use: Not Currently    Comment: social   Drug use: No   Allergies Nsaids  Review of Systems A thorough review of systems was obtained and all systems are negative except as noted in the HPI and PMH.   Physical Exam Vital Signs  I have reviewed the triage vital signs BP 119/83 (BP Location: Left Arm)   Pulse 68   Temp 98.1 F (36.7 C) (Oral)   Resp 18   Ht 5\' 7"  (1.702 m)   Wt 65 kg   SpO2 97%   BMI 22.44 kg/m  Physical Exam Vitals and nursing note reviewed.  Constitutional:      General: He is not in  acute distress.    Appearance: He is well-developed.  HENT:  Head: Normocephalic and atraumatic.     Right Ear: External ear normal.     Left Ear: External ear normal.     Mouth/Throat:     Mouth: Mucous membranes are moist.  Eyes:     General: No scleral icterus. Cardiovascular:     Rate and Rhythm: Regular rhythm. Tachycardia present.     Pulses: Normal pulses.     Heart sounds: Normal heart sounds.  Pulmonary:     Effort: Pulmonary effort is normal. No respiratory distress.     Breath sounds: Normal breath sounds.  Abdominal:     General: Abdomen is flat.     Palpations: Abdomen is soft.     Tenderness: There is no abdominal tenderness.  Musculoskeletal:     Cervical back: No rigidity.     Right lower leg: No edema.     Left lower leg: No edema.  Skin:    General: Skin is warm and dry.     Capillary Refill: Capillary refill takes less than 2 seconds.  Neurological:     Mental Status: He is alert.  Psychiatric:        Mood and Affect: Mood normal.        Behavior: Behavior normal.     ED Results and Treatments Labs (all labs ordered are listed, but only abnormal results are displayed) Labs Reviewed  BASIC METABOLIC PANEL WITH GFR - Abnormal; Notable for the following components:      Result Value   Creatinine, Ser 1.25 (*)    All other components within normal limits  CBC WITH DIFFERENTIAL/PLATELET - Abnormal; Notable for the following components:   RDW 15.7 (*)    All other components within normal limits  TROPONIN I (HIGH SENSITIVITY) - Abnormal; Notable for the following components:   Troponin I (High Sensitivity) 32 (*)    All other components within normal limits  TROPONIN I (HIGH SENSITIVITY) - Abnormal; Notable for the following components:   Troponin I (High Sensitivity) 33 (*)    All other components within normal limits  TSH  MAGNESIUM  BASIC METABOLIC PANEL WITH GFR  BRAIN NATRIURETIC PEPTIDE  MAGNESIUM                                                                                                                           Radiology No results found.  Pertinent labs & imaging results that were available during my care of the patient were reviewed by me and considered in my medical decision making (see MDM for details).  Medications Ordered in ED Medications  amiodarone  (NEXTERONE ) 1.8 mg/mL load via infusion 150 mg (150 mg Intravenous Bolus from Bag 01/11/24 2121)    Followed by  amiodarone  (NEXTERONE  PREMIX) 360-4.14 MG/200ML-% (1.8 mg/mL) IV infusion (60 mg/hr Intravenous New Bag/Given 01/12/24 0004)    Followed by  amiodarone  (NEXTERONE  PREMIX) 360-4.14 MG/200ML-% (1.8 mg/mL) IV infusion (has no administration in time range)  aspirin  EC tablet 81 mg (has no administration  in time range)  carvedilol  (COREG ) tablet 6.25 mg (has no administration in time range)  sacubitril -valsartan  (ENTRESTO ) 24-26 mg per tablet (1 tablet Oral Patient Refused/Not Given 01/11/24 2355)  empagliflozin  (JARDIANCE ) tablet 10 mg (has no administration in time range)  ticagrelor  (BRILINTA ) tablet 60 mg (has no administration in time range)  sodium chloride  flush (NS) 0.9 % injection 3 mL (3 mLs Intravenous Not Given 01/11/24 2336)  sodium chloride  flush (NS) 0.9 % injection 3 mL (has no administration in time range)  0.9 %  sodium chloride  infusion (has no administration in time range)  acetaminophen  (TYLENOL ) tablet 650 mg (has no administration in time range)  ondansetron  (ZOFRAN ) injection 4 mg (has no administration in time range)  heparin  injection 5,000 Units (5,000 Units Subcutaneous Patient Refused/Not Given 01/11/24 2357)                                                                                                                                     Procedures .Critical Care  Performed by: Teddi Favors, DO Authorized by: Teddi Favors, DO   Critical care provider statement:    Critical care time (minutes):  49   Critical care time was  exclusive of:  Separately billable procedures and treating other patients   Critical care was necessary to treat or prevent imminent or life-threatening deterioration of the following conditions:  Cardiac failure   Critical care was time spent personally by me on the following activities:  Development of treatment plan with patient or surrogate, discussions with consultants, evaluation of patient's response to treatment, examination of patient, ordering and review of laboratory studies, ordering and review of radiographic studies, ordering and performing treatments and interventions, pulse oximetry, re-evaluation of patient's condition, review of old charts and obtaining history from patient or surrogate   Care discussed with: admitting provider     (including critical care time)  Medical Decision Making / ED Course    Medical Decision Making:    EZEKIAH MASSIE is a 68 y.o. male with past medical history as below, significant for CAD, bipolar 1, HLD, PAF who presents to the ED with complaint of vtach. The complaint involves an extensive differential diagnosis and also carries with it a high risk of complications and morbidity.  Serious etiology was considered. Ddx includes but is not limited to: arrhythmia, vtach, svt, sinus tachy, etc  Complete initial physical exam performed, notably the patient was in no distress, asymptomatic.    Reviewed and confirmed nursing documentation for past medical history, family history, social history.  Vital signs reviewed.     Brief summary:  68 yo male hx as above here with arrhythmia Vtach noted on EKG Started amio On telemetry appears to be back in sinus Mild trop leak likely demand ischemia in setting of vt Cr similar to prior    Clinical Course as of 01/12/24 0020  Fri Jan 11, 2024  2046  VT noted on EKG, advised triage to place pt in room urgently. Ordered amio  [SG]  2047 Was seen in office w/ Dr Sw[place]iland, advised come to ED for VT, he left  AMA [SG]  2114 Spoke w/ cardiology, will admit [SG]    Clinical Course User Index [SG] Teddi Favors, DO                Additional history obtained: -Additional history obtained from na -External records from outside source obtained and reviewed including: Chart review including previous notes, labs, imaging, consultation notes including  Cardiology documentation Home meds    Lab Tests: -I ordered, reviewed, and interpreted labs.   The pertinent results include:   Labs Reviewed  BASIC METABOLIC PANEL WITH GFR - Abnormal; Notable for the following components:      Result Value   Creatinine, Ser 1.25 (*)    All other components within normal limits  CBC WITH DIFFERENTIAL/PLATELET - Abnormal; Notable for the following components:   RDW 15.7 (*)    All other components within normal limits  TROPONIN I (HIGH SENSITIVITY) - Abnormal; Notable for the following components:   Troponin I (High Sensitivity) 32 (*)    All other components within normal limits  TROPONIN I (HIGH SENSITIVITY) - Abnormal; Notable for the following components:   Troponin I (High Sensitivity) 33 (*)    All other components within normal limits  TSH  MAGNESIUM  BASIC METABOLIC PANEL WITH GFR  BRAIN NATRIURETIC PEPTIDE  MAGNESIUM    Notable for trop +  EKG   EKG Interpretation Date/Time:  Friday Jan 11 2024 20:42:07 EDT Ventricular Rate:  173 PR Interval:  104 QRS Duration:  114 QT Interval:  246 QTC Calculation: 417 R Axis:   -63  Text Interpretation: vtach Undetermined rhythm Left axis deviation Abnormal ECG When compared with ECG of 11-Jan-2024 15:28, PREVIOUS ECG IS PRESENT Confirmed by Russella Courts ([id number]) on 01/11/2024 8:44:59 PM         Imaging Studies ordered: I ordered imaging studies including cxr    Medicines ordered and prescription drug management: Meds ordered this encounter  Medications   FOLLOWED BY Linked Order Group    amiodarone  (NEXTERONE ) 1.8 mg/mL load via  infusion 150 mg    amiodarone  (NEXTERONE  PREMIX) 360-4.14 MG/200ML-% (1.8 mg/mL) IV infusion    amiodarone  (NEXTERONE  PREMIX) 360-4.14 MG/200ML-% (1.8 mg/mL) IV infusion   aspirin  EC tablet 81 mg   carvedilol  (COREG ) tablet 6.25 mg   sacubitril -valsartan  (ENTRESTO ) 24-26 mg per tablet    ACE-inhibitors have NOT been administered in the past 36-hours.:   NOT APPLICABLE (continuing home med)   empagliflozin  (JARDIANCE ) tablet 10 mg   ticagrelor  (BRILINTA ) tablet 60 mg   sodium chloride  flush (NS) 0.9 % injection 3 mL   sodium chloride  flush (NS) 0.9 % injection 3 mL   0.9 %  sodium chloride  infusion   acetaminophen  (TYLENOL ) tablet 650 mg   ondansetron  (ZOFRAN ) injection 4 mg   heparin  injection 5,000 Units    -I have reviewed the patients home medicines and have made adjustments as needed   Consultations Obtained: I requested consultation with the cardiology,  and discussed lab and imaging findings as well as pertinent plan - they recommend: admit   Cardiac Monitoring: The patient was maintained on a cardiac monitor.  I personally viewed and interpreted the cardiac monitored which showed an underlying rhythm of: vtach Continuous pulse oximetry interpreted by myself, 98% on RA.    Social Determinants of  Health:  Diagnosis or treatment significantly limited by social determinants of health: current smoker   Reevaluation: After the interventions noted above, I reevaluated the patient and found that they have improved  Co morbidities that complicate the patient evaluation  Past Medical History:  Diagnosis Date   Bipolar 1 disorder (HCC)    CAD (coronary artery disease)    a. STEMI 09/2012  single vessel occlusive CAD of the RCA s/p DES to mid RCA, EF 45%;  b. inferior STEMI 03/10/2015 DES to prox and mid RCA   Depression    Hyperlipidemia    LV dysfunction    a. 09/2012 Cath LV gram severe basal to midinferior wall hypokinesis, EF 45% and echo EF 55%.   PAF (paroxysmal atrial  fibrillation) (HCC)    a. noted on admission 02/2015 in setting of inf stemi;  b. CHA2DS2VASc = 1.   Tobacco abuse       Dispostion: Disposition decision including need for hospitalization was considered, and patient admitted to the hospital.    Final Clinical Impression(s) / ED Diagnoses Final diagnoses:  VT (ventricular tachycardia) (HCC)        Teddi Favors, DO 01/12/24 0020

## 2024-01-11 NOTE — Telephone Encounter (Signed)
 Spoke to patient he stated he had a episode of fast beat this past Tuesday that lasted appox 5 hours.He continues to have episodes not lasting long.He feels weak.Stated he has had a lot of stress.Advised Dr.Jordan has had a cancellation this afternoon.Appointment scheduled at 2:40 pm.

## 2024-01-11 NOTE — Telephone Encounter (Signed)
 Pt is calling into the office to let Dr. Swaziland and nurse know that over the last 4 days, he has been experiencing racing heart and dizziness.  Pt states the episodes consist of feeling fast heart rate, palpitations, accompanied with dizziness.   He states the episodes are intermittent in nature and can last anywhere from 4 hours to 30 mins.  He states he gets extremely dizzy with the episodes, but has not had any pre-syncopal or syncopal episodes with these events.   Pt denies any chest pain, sob, DOE, lower extremity edema, orthopnea, pre-syncope or syncope.   He states he has a history of wide complex tachycardia, and for the most part this has been under control, except for the last 4 days.   Pt states when he gets stressed or anxious about something, that seems to trigger the events of his V-tach.  Pt mentioned over the last week he's been dealing with a lot of stressful situations, and associates that with his recent symptoms of racing heart and dizziness over the last 4 days.   Asked the pt if he is tracking his BP/HR at home and he is not and does not have the capability to do so.  I advised him to consider purchasing a BP monitor at any local pharmacy, in the near future.   Pt confirmed he is taking all his cardiac medications as prescribed, and for the most part has been doing really well on this regimen, with the exception of recent cardiac symptoms.   Pt states he would like to get this message to Dr. Swaziland and nurse for further advisement and follow-up.   [place], and feels asymptomatic at this current time.   Pt aware I will route this to Dr. Swaziland and Bartholomew Light for further advisement and follow-up.   Did provide him ED precautions in the interim, if symptoms return/persist/worsen.  Advised him to refrain from caffeine/decaff/tea and any other stimulating factors for now, given his complaints of racing heart and dizziness.  Pt verbalized  understanding and agrees with this plan.

## 2024-01-11 NOTE — Telephone Encounter (Signed)
 Returned call to patient's wife no answer.

## 2024-01-11 NOTE — ED Triage Notes (Signed)
 The pt reports that he has had tachycardia all day  he was in the heart doctors office this afternoon and they wanted him to come here but he refused to come then

## 2024-01-11 NOTE — Telephone Encounter (Signed)
 Patient c/o Palpitations:  STAT if patient reporting lightheadedness, shortness of breath, or chest pain  How long have you had palpitations/irregular HR/ Afib? Are you having the symptoms now? Last 4 days, yes  Are you currently experiencing lightheadedness, SOB or CP? lightheaded  Do you have a history of afib (atrial fibrillation) or irregular heart rhythm? yes  Have you checked your BP or HR? (document readings if available): no  Are you experiencing any other symptoms? Fatigue,dizziness

## 2024-01-11 NOTE — ED Notes (Signed)
 Central Telemetry notified for cardiac monitoring.

## 2024-01-11 NOTE — ED Notes (Signed)
 Admitting provider at bedside.

## 2024-01-11 NOTE — Telephone Encounter (Signed)
 Wife would like to talk to Dr Sw[place]iland about today's visit. She is very concerned. [place]

## 2024-01-11 NOTE — ED Notes (Signed)
 CCMD contacted for monitoring

## 2024-01-12 ENCOUNTER — Other Ambulatory Visit: Payer: Self-pay

## 2024-01-12 ENCOUNTER — Encounter (HOSPITAL_COMMUNITY): Payer: Self-pay | Admitting: Cardiology

## 2024-01-12 ENCOUNTER — Inpatient Hospital Stay (HOSPITAL_COMMUNITY)

## 2024-01-12 ENCOUNTER — Other Ambulatory Visit (HOSPITAL_COMMUNITY)

## 2024-01-12 ENCOUNTER — Encounter (HOSPITAL_COMMUNITY)
Admission: EM | Disposition: A | Discharge status: Home / Self Care | Payer: Self-pay | Source: Home / Self Care | Attending: Internal Medicine

## 2024-01-12 DIAGNOSIS — I4729 Other ventricular tachycardia: Secondary | ICD-10-CM

## 2024-01-12 DIAGNOSIS — I2510 Atherosclerotic heart disease of native coronary artery without angina pectoris: Secondary | ICD-10-CM | POA: Diagnosis not present

## 2024-01-12 DIAGNOSIS — I472 Ventricular tachycardia, unspecified: Secondary | ICD-10-CM | POA: Diagnosis not present

## 2024-01-12 DIAGNOSIS — I5042 Chronic combined systolic (congestive) and diastolic (congestive) heart failure: Secondary | ICD-10-CM | POA: Diagnosis not present

## 2024-01-12 DIAGNOSIS — I470 Re-entry ventricular arrhythmia: Secondary | ICD-10-CM | POA: Diagnosis not present

## 2024-01-12 HISTORY — PX: LEFT HEART CATH AND CORONARY ANGIOGRAPHY: CATH118249

## 2024-01-12 LAB — BASIC METABOLIC PANEL WITH GFR
Anion gap: 8 (ref 5–15)
BUN: 14 mg/dL (ref 8–23)
CO2: 21 mmol/L — ABNORMAL LOW (ref 22–32)
Calcium: 8.4 mg/dL — ABNORMAL LOW (ref 8.9–10.3)
Chloride: 107 mmol/L (ref 98–111)
Creatinine, Ser: 1 mg/dL (ref 0.61–1.24)
GFR, Estimated: >60 mL/min (ref >60)
Glucose, Bld: 94 mg/dL (ref 70–99)
Potassium: 3.8 mmol/L (ref 3.5–5.1)
Sodium: 136 mmol/L (ref 135–145)

## 2024-01-12 LAB — ECHOCARDIOGRAM COMPLETE
Area-P 1/2: 3.27 cm2
Calc EF: 37.6 %
Height: 67 in
S' Lateral: 4.1 cm
Single Plane A2C EF: 28.4 %
Single Plane A4C EF: 46.1 %
Weight: 2292.78 [oz_av]

## 2024-01-12 LAB — MAGNESIUM: Magnesium: 2 mg/dL (ref 1.7–2.4)

## 2024-01-12 LAB — BRAIN NATRIURETIC PEPTIDE: B Natriuretic Peptide: 600.1 pg/mL — ABNORMAL HIGH (ref 0.0–100.0)

## 2024-01-12 SURGERY — LEFT HEART CATH AND CORONARY ANGIOGRAPHY
Anesthesia: LOCAL

## 2024-01-12 MED ORDER — HEPARIN (PORCINE) IN NACL 1000-0.9 UT/500ML-% IV SOLN
INTRAVENOUS | Status: DC | PRN
  Administered 2024-01-12 (×2): 500 mL

## 2024-01-12 MED ORDER — ACETAMINOPHEN 325 MG PO TABS: 650.0000 mg | ORAL_TABLET | ORAL | Status: DC | PRN

## 2024-01-12 MED ORDER — FENTANYL CITRATE (PF) 100 MCG/2ML IJ SOLN
INTRAMUSCULAR | Status: DC | PRN
Start: 2024-01-12 — End: 2024-01-12
  Administered 2024-01-12: 25 ug via INTRAVENOUS

## 2024-01-12 MED ORDER — VERAPAMIL HCL 2.5 MG/ML IV SOLN
INTRAVENOUS | Status: AC
  Filled 2024-01-12: qty 2

## 2024-01-12 MED ORDER — SODIUM CHLORIDE 0.9 % IV SOLN: 250.0000 mL | INTRAVENOUS | Status: AC | PRN

## 2024-01-12 MED ORDER — LIDOCAINE HCL (PF) 1 % IJ SOLN
INTRAMUSCULAR | Status: DC | PRN
  Administered 2024-01-12: 2 mL via INTRADERMAL

## 2024-01-12 MED ORDER — SODIUM CHLORIDE 0.9% FLUSH
3.0000 mL | Freq: Two times a day (BID) | INTRAVENOUS | Status: DC
  Administered 2024-01-13 – 2024-01-14 (×2): 3 mL via INTRAVENOUS

## 2024-01-12 MED ORDER — LIDOCAINE HCL (PF) 1 % IJ SOLN
INTRAMUSCULAR | Status: AC
  Filled 2024-01-12: qty 30

## 2024-01-12 MED ORDER — LIDOCAINE IN D5W 4-5 MG/ML-% IV SOLN
1.0000 mg/min | INTRAVENOUS | Status: DC
  Administered 2024-01-12: 1 mg/min via INTRAVENOUS
  Filled 2024-01-12: qty 500

## 2024-01-12 MED ORDER — HYDRALAZINE HCL 20 MG/ML IJ SOLN: 10.0000 mg | INTRAMUSCULAR | Status: AC | PRN

## 2024-01-12 MED ORDER — LIDOCAINE BOLUS VIA INFUSION
50.0000 mg | Freq: Once | INTRAVENOUS | Status: AC
  Administered 2024-01-12: 50 mg via INTRAVENOUS
  Filled 2024-01-12: qty 52

## 2024-01-12 MED ORDER — MIDAZOLAM HCL 2 MG/2ML IJ SOLN
INTRAMUSCULAR | Status: AC
  Filled 2024-01-12: qty 2

## 2024-01-12 MED ORDER — MIDAZOLAM HCL 2 MG/2ML IJ SOLN
INTRAMUSCULAR | Status: DC | PRN
Start: 2024-01-12 — End: 2024-01-12
  Administered 2024-01-12: 1 mg via INTRAVENOUS

## 2024-01-12 MED ORDER — FENTANYL CITRATE (PF) 100 MCG/2ML IJ SOLN
INTRAMUSCULAR | Status: AC
  Filled 2024-01-12: qty 2

## 2024-01-12 MED ORDER — HEPARIN SODIUM (PORCINE) 1000 UNIT/ML IJ SOLN
INTRAMUSCULAR | Status: DC | PRN
  Administered 2024-01-12: 3500 [IU] via INTRAVENOUS

## 2024-01-12 MED ORDER — ONDANSETRON HCL 4 MG/2ML IJ SOLN: 4.0000 mg | Freq: Four times a day (QID) | INTRAMUSCULAR | Status: DC | PRN

## 2024-01-12 MED ORDER — VERAPAMIL HCL 2.5 MG/ML IV SOLN
INTRAVENOUS | Status: DC | PRN
  Administered 2024-01-12: 10 mL via INTRA_ARTERIAL

## 2024-01-12 MED ORDER — SODIUM CHLORIDE 0.9% FLUSH: 3.0000 mL | INTRAVENOUS | Status: DC | PRN

## 2024-01-12 MED ORDER — IOHEXOL 350 MG/ML SOLN
INTRAVENOUS | Status: DC | PRN
  Administered 2024-01-12: 20 mL

## 2024-01-12 SURGICAL SUPPLY — 11 items
CATH INFINITI 5FR JL4 (CATHETERS) IMPLANT
CATH INFINITI AMBI 5FR TG (CATHETERS) IMPLANT
CATH INFINITI JR4 5F (CATHETERS) IMPLANT
DEVICE RAD COMP TR BAND LRG (VASCULAR PRODUCTS) IMPLANT
ELECT DEFIB PAD ADLT CADENCE (PAD) IMPLANT
GLIDESHEATH SLEND A-KIT 6F 22G (SHEATH) IMPLANT
GLIDESHEATH SLEND SS 6F .021 (SHEATH) IMPLANT
GUIDEWIRE INQWIRE 1.5J.035X260 (WIRE) IMPLANT
PACK CARDIAC CATHETERIZATION (CUSTOM PROCEDURE TRAY) ×1 IMPLANT
SET ATX-X65L (MISCELLANEOUS) IMPLANT
SHEATH PROBE COVER 6X72 (BAG) IMPLANT

## 2024-01-12 NOTE — Progress Notes (Signed)
  Echocardiogram 2D Echocardiogram has been performed.  Jason Maldonado 01/12/2024, 3:22 PM

## 2024-01-12 NOTE — ED Notes (Signed)
 CCMD contacted this nurse that pt rhythm had changed to a wide QRS with HR in 140s. This RN immediately to bedside. Pt sitting up in bed eating lunch, no acute distress noted, alert and oriented x4. Reporting some mild heaviness in chest but no other complaints or alteration in mentation. Marven Slimmer MD notified via secure chat at [phone].

## 2024-01-12 NOTE — Progress Notes (Signed)
 CARDIOLOGY BRIEF NOTE  Called by RN for recurrent VT. Patient reports chest "dropping" sensation/discomfort during the episode.   ECG reviewed. Similar morphology VT with AV dissociation and RBBB/LAFB pattern.   I evaluated the patient. He reports feeling better now that the rhythm has normalized. I advised him that I thought he should have a LHC to reevaluate the RCA and for any other new disease. He is agreeable.  Keep NPO.  Start IV lidocaine  at 1mg /min after 50mg  bolus.  Continue IV amiodarone .    CRITICAL CARE Performed by: [name]  Total critical care time: 35 minutes  Critical care time was exclusive of separately billable procedures and treating other patients.  Critical care was necessary to treat or prevent imminent or life-threatening deterioration.  Critical care was time spent personally by me on the following activities: development of treatment plan with patient and/or surrogate as well as nursing, discussions with consultants, evaluation of patient's response to treatment, examination of patient, obtaining history from patient or surrogate, ordering and performing treatments and interventions, ordering and review of laboratory studies, ordering and review of radiographic studies, pulse oximetry and re-evaluation of patient's condition.   Donelda Fujita T. Marven Slimmer, MD, [facility], [facility] Cardiac Electrophysiology

## 2024-01-12 NOTE — Progress Notes (Signed)
   01/12/24 1341  Spiritual Encounters  Type of Visit Initial  Reason for visit Code  OnCall Visit Yes   Chaplain responded to Code STEMI. Patient taken to Cath Lab. No spiritual need at this time.  Chaplain Alphonzo Ask

## 2024-01-12 NOTE — ED Notes (Signed)
 Pt converted out of wide QRS rhythm. Additional EKG captured. Marven Slimmer MD notified. See new orders for lidocaine  and keeping pt NPO. Pt remains in no acute distress, states the heaviness in his chest has ceased.

## 2024-01-12 NOTE — Consult Note (Signed)
 Electrophysiology consultation   Patient ID: Jason Maldonado MRN: [id number]; DOB: [birth year]  Admit date: 01/11/2024 Date of Consult: 01/12/2024  PCP:  [place], Jason Jansky, MD   History of Present Illness:   [name] is a 68 year old man who I am seeing today for an evaluation of ventricular tachycardia at the request of Dr. Ossie Blend.  The patient has an extensive past medical history that includes severe coronary artery disease with prior inferior infarct.  His coronary disease has been complicated by ischemic cardiomyopathy with a chronically reduced left ventricular function.  His last EF was 30% in September 2024.  He also has a history of bipolar disorder that is complicating his management.  Also with hyperlipidemia.  He presented to clinic on Jan 11, 2024 with a wide-complex tachycardia.  The ventricular rate was 163 bpm.  QRS morphology was right bundle with a left anterior fascicular block pattern.  There was A-V dissociation.  He was referred to the emergency department but the patient signed out AMA from clinic.  He presented overnight with continued lightheadedness and dizziness.  Also feeling tingling in his fingers.  In the emergency department he was started on amiodarone .  This converted him to sinus rhythm.  This morning he is agitated. His wife joins by phone. They are arguing at each other while I took this history. He is a poor historian. He does confirm lightheadedness and dizziness for "days" before his appointment yesterday. According to the wife, the symptoms were intermittent but significant.   He says there is "no need for this" and that he "is fine". He reports there is "no way" he will comply with the driving restriction. He also says "no way" he will get a defibrillator.   Past medical, surgical, social and family history reviewed.  ROS:  Please see the history of present illness.  All other ROS reviewed and negative.     Physical Exam/Data:   Vitals:    01/11/24 2330 01/12/24 0000 01/12/24 0334 01/12/24 0600  BP: 119/83 138/85 (!) 128/95 (!) 133/94  Pulse: 68 70 64 65  Resp: 18 16 13 12   Temp:  98 F (36.7 C)  98.2 F (36.8 C)  TempSrc:  Oral    SpO2: 97% 97% 95% 95%  Weight:      Height:         General:  Well nourished,agitated. Cardiac:  normal S1, S2; RRR; no murmur  Lungs:  clear to auscultation bilaterally, no wheezing, rhonchi or rales  Psych:  agitated.   EKG:  The EKG was personally reviewed and demonstrates:   Sinus rhythm, PACs, inferior infarct   Telemetry:  Telemetry was personally reviewed and demonstrates: Sinus rhythm.  Multifocal PVCs   September 2024 echo EF 30 to 35% RV normal Trivial MR  ------------------------------------------   Assessment and Plan:  #Ventricular tachycardia The patient presents with symptomatic ventricular tachycardia.  Thankfully, this was hemodynamically tolerated.  I suspect this is scar mediated from his inferior infarct.  I suspect the exit site is near the  left posterior fascicle.  He has a history of chronically reduced left ventricular function and significant coronary artery disease.  Given his sustained ventricular tachycardia, I have recommended ICD for secondary prevention.    I have recommended ICD implant prior to discharge.   For now, continue amiodarone  IV for 24 more hours.  Anticipate transition to oral amiodarone  tomorrow. Reviewed driving restrictions again with the patient.  He should avoid driving for 6 months.  He is declining ICD implant at this time. I have asked his wife to discuss this further with him.   #Chronic systolic heart failure NYHA class II.  Warm and dry on exam.  Complicated by VT as above. Continue Entresto , Jardiance , Coreg   #Coronary artery disease No ischemic symptoms today.  On aspirin  and ticagrelor .    Donelda Fujita T. Marven Slimmer, MD, [facility], [facility] Cardiac Electrophysiology

## 2024-01-12 NOTE — ED Notes (Signed)
Lalla Brothers MD at bedside.

## 2024-01-12 NOTE — ED Notes (Signed)
 Pt on the phone when this RN came in to room. Pt complaining "No one came to see him the entire night and I'm not staying here to continue to be experimented on and for the hospital to make money on me and use me as a pin cushion and experiment. I had to get my own urinal. They put my tray over there and I can't get to it. Do you even have any nurses working here? What's the point in being here if I'm not being checked on and all of my medications are wrong. The pharmacy tech that did the medications last night was too busy running their mouth and entering stuff into the computer and they can't even do that right."

## 2024-01-12 NOTE — ED Notes (Signed)
 Pt transported to cath lab 1 by this RN on monitor. All belongings placed in pt belonging bag and sent with pt to cath lab.

## 2024-01-13 DIAGNOSIS — I472 Ventricular tachycardia, unspecified: Secondary | ICD-10-CM | POA: Diagnosis not present

## 2024-01-13 LAB — BASIC METABOLIC PANEL WITH GFR
Anion gap: 9 (ref 5–15)
BUN: 19 mg/dL (ref 8–23)
CO2: 20 mmol/L — ABNORMAL LOW (ref 22–32)
Calcium: 8.7 mg/dL — ABNORMAL LOW (ref 8.9–10.3)
Chloride: 104 mmol/L (ref 98–111)
Creatinine, Ser: 1.06 mg/dL (ref 0.61–1.24)
GFR, Estimated: >60 mL/min (ref >60)
Glucose, Bld: 98 mg/dL (ref 70–99)
Potassium: 3.8 mmol/L (ref 3.5–5.1)
Sodium: 133 mmol/L — ABNORMAL LOW (ref 135–145)

## 2024-01-13 MED ORDER — ALPRAZOLAM 0.25 MG PO TABS
0.2500 mg | ORAL_TABLET | Freq: Two times a day (BID) | ORAL | Status: AC | PRN
  Administered 2024-01-13 – 2024-01-14 (×2): 0.25 mg via ORAL
  Filled 2024-01-13 (×2): qty 1

## 2024-01-13 MED ORDER — CHLORHEXIDINE GLUCONATE CLOTH 2 % EX PADS
6.0000 | MEDICATED_PAD | Freq: Every day | CUTANEOUS | Status: DC
  Administered 2024-01-13 – 2024-01-16 (×3): 6 via TOPICAL

## 2024-01-13 MED ORDER — MEXILETINE HCL 150 MG PO CAPS
150.0000 mg | ORAL_CAPSULE | Freq: Three times a day (TID) | ORAL | Status: DC
  Administered 2024-01-13 – 2024-01-16 (×10): 150 mg via ORAL
  Filled 2024-01-13 (×10): qty 1

## 2024-01-13 MED ORDER — ALPRAZOLAM 0.5 MG PO TABS
0.5000 mg | ORAL_TABLET | Freq: Once | ORAL | Status: AC
  Administered 2024-01-13: 0.25 mg via ORAL
  Filled 2024-01-13: qty 1

## 2024-01-13 NOTE — Progress Notes (Addendum)
   Rounding Note    Patient Name: Jason Maldonado Date of Encounter: 01/13/2024   HeartCare Cardiologist: Peter Swaziland, MD   Subjective   NAEO. Rhythm stable overnight. Calmer this AM.  Vital Signs    Vitals:   01/13/24 0424 01/13/24 0500 01/13/24 0600 01/13/24 0700  BP:  (!) 142/96 124/87 122/77  Pulse:  61 (!) 59 (!) 58  Resp:  18 17 15   Temp: 98.4 F (36.9 C)     TempSrc: Oral     SpO2:  94% 93% 93%  Weight: 61.4 kg     Height:        Intake/Output Summary (Last 24 hours) at 01/13/2024 0751 Last data filed at 01/13/2024 0622 Gross per 24 hour  Intake 693.69 ml  Output 3075 ml  Net -2381.31 ml      01/13/2024    4:24 AM 01/11/2024    8:38 PM 01/11/2024    3:03 PM  Last 3 Weights  Weight (lbs) 135 lb 5.8 oz 143 lb 4.8 oz 143 lb 3.2 oz  Weight (kg) 61.4 kg 65 kg 64.955 kg      Telemetry    Personally Reviewed. No sustained ventricular arrhythmias.  Physical Exam   GEN: No acute distress.   Cardiac: RRR, no murmurs, rubs, or gallops.  Respiratory: Clear to auscultation bilaterally. Psych: Normal affect   Assessment & Plan    #Ventricular tachycardia Quiescent over night. Continue IV Amiodarone  Stop IV lidocaine  this AM. Start mexiletine 150mg  PO TID  If remains quiet for next 24 hours, will transition amiodarone  to oral tomorrow.  Cardiac MRI ordered.  Will need ICD prior to discharge.   #Chronic systolic heart failure NYHA class II.  Warm and dry on exam.  Complicated by VT as above. Continue Entresto , Jardiance , Coreg    #Coronary artery disease No ischemic symptoms today.  On aspirin  and ticagrelor .    Jason Maldonado T. Marven Slimmer, MD, [facility], [facility] Cardiac Electrophysiology    CRITICAL CARE Performed by: [name] Total critical care time: 30 minutes Critical care time was exclusive of separately billable procedures and treating other patients. Critical care was necessary to treat or prevent imminent or life-threatening  deterioration. Critical care was time spent personally by me on the following activities: development of treatment plan with patient and/or surrogate as well as nursing, discussions with consultants, evaluation of patient's response to treatment, examination of patient, obtaining history from patient or surrogate, ordering and performing treatments and interventions, ordering and review of laboratory studies, ordering and review of radiographic studies, pulse oximetry and re-evaluation of patient's condition.

## 2024-01-14 ENCOUNTER — Inpatient Hospital Stay (HOSPITAL_COMMUNITY)

## 2024-01-14 DIAGNOSIS — I472 Ventricular tachycardia, unspecified: Secondary | ICD-10-CM

## 2024-01-14 LAB — BASIC METABOLIC PANEL WITH GFR
Anion gap: 8 (ref 5–15)
BUN: 19 mg/dL (ref 8–23)
CO2: 21 mmol/L — ABNORMAL LOW (ref 22–32)
Calcium: 8.4 mg/dL — ABNORMAL LOW (ref 8.9–10.3)
Chloride: 102 mmol/L (ref 98–111)
Creatinine, Ser: 1.32 mg/dL — ABNORMAL HIGH (ref 0.61–1.24)
GFR, Estimated: 58 mL/min — ABNORMAL LOW (ref >60)
Glucose, Bld: 88 mg/dL (ref 70–99)
Potassium: 3.6 mmol/L (ref 3.5–5.1)
Sodium: 131 mmol/L — ABNORMAL LOW (ref 135–145)

## 2024-01-14 LAB — SURGICAL PCR SCREEN
MRSA, PCR: NEGATIVE
Staphylococcus aureus: NEGATIVE

## 2024-01-14 LAB — LIPOPROTEIN A (LPA): Lipoprotein (a): 149.5 nmol/L — ABNORMAL HIGH (ref <75.0)

## 2024-01-14 MED ORDER — SODIUM CHLORIDE 0.9% FLUSH: 3.0000 mL | Freq: Two times a day (BID) | INTRAVENOUS | Status: DC

## 2024-01-14 MED ORDER — CHLORHEXIDINE GLUCONATE 4 % EX SOLN
60.0000 mL | Freq: Once | CUTANEOUS | Status: AC
  Administered 2024-01-15: 4 via TOPICAL
  Filled 2024-01-14: qty 60

## 2024-01-14 MED ORDER — SODIUM CHLORIDE 0.9% FLUSH: 3.0000 mL | INTRAVENOUS | Status: DC | PRN

## 2024-01-14 MED ORDER — GADOBUTROL 1 MMOL/ML IV SOLN
10.0000 mL | Freq: Once | INTRAVENOUS | Status: AC | PRN
  Administered 2024-01-14: 10 mL via INTRAVENOUS

## 2024-01-14 MED ORDER — CEFAZOLIN SODIUM-DEXTROSE 2-4 GM/100ML-% IV SOLN
2.0000 g | INTRAVENOUS | Status: AC
  Filled 2024-01-14: qty 100

## 2024-01-14 MED ORDER — CHLORHEXIDINE GLUCONATE 4 % EX SOLN
60.0000 mL | Freq: Once | CUTANEOUS | Status: AC
  Administered 2024-01-15: 4 via TOPICAL

## 2024-01-14 MED ORDER — ALPRAZOLAM 0.25 MG PO TABS
0.2500 mg | ORAL_TABLET | Freq: Once | ORAL | Status: AC
  Administered 2024-01-14: 0.25 mg via ORAL
  Filled 2024-01-14: qty 1

## 2024-01-14 MED ORDER — SODIUM CHLORIDE 0.9 % IV SOLN: 80.0000 mg | INTRAVENOUS | Status: AC

## 2024-01-14 MED ORDER — SODIUM CHLORIDE 0.9% FLUSH
3.0000 mL | Freq: Two times a day (BID) | INTRAVENOUS | Status: DC
  Administered 2024-01-14 – 2024-01-15 (×2): 3 mL via INTRAVENOUS

## 2024-01-14 MED ORDER — SODIUM CHLORIDE 0.9 % IV SOLN: 250.0000 mL | INTRAVENOUS | Status: DC

## 2024-01-14 NOTE — Progress Notes (Addendum)
 Rounding Note   Patient Name: Jason Maldonado Date of Encounter: 01/14/2024   HeartCare Cardiologist: Peter Swaziland, MD   Subjective  Feels frustrated, wasted time over the weekend with "nothing getting done" otherwise denies concerns/symptoms Happy to see Dr. Arlester Ladd  Scheduled Meds:  aspirin  EC  81 mg Oral Daily   carvedilol   6.25 mg Oral BID WC   Chlorhexidine  Gluconate Cloth  6 each Topical Daily   empagliflozin   10 mg Oral QAC breakfast   mexiletine  150 mg Oral Q8H   sacubitril -valsartan   1 tablet Oral BID   sodium chloride  flush  3 mL Intravenous Q12H   sodium chloride  flush  3 mL Intravenous Q12H   ticagrelor   60 mg Oral BID   Continuous Infusions:  amiodarone  30 mg/hr (01/14/24 0537)   PRN Meds: acetaminophen , ALPRAZolam, ondansetron  (ZOFRAN ) IV, sodium chloride  flush, sodium chloride  flush   Vital Signs  Vitals:   01/14/24 0400 01/14/24 0500 01/14/24 0600 01/14/24 0700  BP: 91/61 113/85 (!) 88/59 (!) 90/43  Pulse: (!) 57 (!) 56 64 (!) 57  Resp: 18 16 (!) 23 18  Temp:      TempSrc:      SpO2: 93% 93% 91% 92%  Weight:      Height:        Intake/Output Summary (Last 24 hours) at 01/14/2024 0725 Last data filed at 01/14/2024 0537 Gross per 24 hour  Intake 675.15 ml  Output 2200 ml  Net -1524.85 ml      01/14/2024    3:43 AM 01/13/2024    4:24 AM 01/11/2024    8:38 PM  Last 3 Weights  Weight (lbs) 141 lb 15.6 oz 135 lb 5.8 oz 143 lb 4.8 oz  Weight (kg) 64.4 kg 61.4 kg 65 kg      Telemetry SR 60's - Personally Reviewed  ECG  No new EKGs - Personally Reviewed  Physical Exam  GEN: No acute distress.   Neck: No JVD Cardiac: RRR, no murmurs, rubs, or gallops.  Respiratory: Clear to auscultation bilaterally. GI: Soft, nontender, non-distended  MS: No edema; No deformity. Neuro:  Nonfocal  Psych: Normal affect   Labs High Sensitivity Troponin:   Recent Labs  Lab 01/11/24 2047 01/11/24 2310  TROPONINIHS 32* 33*     Chemistry Recent  Labs  Lab 01/11/24 2047 01/12/24 0329 01/13/24 0233 01/14/24 0341  NA 137 136 133* 131*  K 4.0 3.8 3.8 3.6  CL 102 107 104 102  CO2 22 21* 20* 21*  GLUCOSE 93 94 98 88  BUN 17 14 19 19   CREATININE 1.25* 1.00 1.06 1.32*  CALCIUM  9.3 8.4* 8.7* 8.4*  MG 2.0 2.0  --   --   GFRNONAA >60 >60 >60 58*  ANIONGAP 13 8 9 8     Lipids No results for input(s): "CHOL", "TRIG", "HDL", "LABVLDL", "LDLCALC", "CHOLHDL" in the last 168 hours.  Hematology Recent Labs  Lab 01/11/24 2047  WBC 7.8  RBC 5.46  HGB 14.3  HCT 44.8  MCV 82.1  MCH 26.2  MCHC 31.9  RDW 15.7*  PLT 269   Thyroid   Recent Labs  Lab 01/11/24 2047  TSH 2.465    BNP Recent Labs  Lab 01/12/24 0329  BNP 600.1*    DDimer No results for input(s): "DDIMER" in the last 168 hours.   Radiology    Cardiac Studies   01/12/24: TTE 1. Left ventricular ejection fraction, by estimation, is 30 to 35%. The  left ventricle has moderately  decreased function. The left ventricle  demonstrates regional wall motion abnormalities (see scoring  diagram/findings for description). Left ventricular   diastolic parameters are consistent with Grade I diastolic dysfunction  (impaired relaxation). The entire septum, entire inferior wall, posterior  wall, and apex are hypokinetic.   2. Right ventricular systolic function is mildly reduced. The right  ventricular size is normal. Tricuspid regurgitation signal is inadequate  for assessing PA pressure.   3. The mitral valve is grossly normal. No evidence of mitral valve  regurgitation. No evidence of mitral stenosis.   4. The aortic valve is tricuspid. Aortic valve regurgitation is not  visualized. No aortic stenosis is present.   Comparison(s): A prior study was performed on 04/26/2023. Reported LVEF  30-35% indeterminate diastolic function, mild left atrial dilatation.   Conclusion(s)/Recommendation(s): Consider either limited echo with  definity or cMRI if no contraindication to  evaluate EF and RWMA.    Coronary angiography 01/12/2024: LM: Normal LAD: Mild 20-30% disease in prox mid LAD, diag 1 Lcx: No significant disease RCA: Large dominant vessel           Patent prox-mid stents with minimal 30% late lumen loss           Mid 50% disease past the stents           Minimal disease distal RCA   LVEDP 7 mmHg   No acute ischemic etiology for VT, likely scar mediated VT Normal filling pressures Consider ICD placement, however patient seems to be very reluctant  Patient Profile   68 y.o. male w/PMHx of Bipolar, HLD, HTN, smoker CAD (inferior MI in 2014 with emergent PCI to the RCA >> second STEMI on 03/10/2015 as a inferior STEMI in the setting of RCA in-stent restenosis/occlusion  ICM  Feb 2024  WCT >> R/LHC on 01/19/2023 revealed nonobstructive CAD, continue patency of stents in the RCA, normal LV filling pressures, normal right heart pressures, reduced cardiac output  Referred to EP discussed ICD/life vest > GDMT > pt was not quite agreeable to ewither vest or implant  GDMT >> persistent reduced LVEF > recs for EP revisit and ICD > pt again declined  Admitted this visit w/ WCT > [place] Arrived to a emergent/work-in visit to Dr. Swaziland in [place] was c/o lightheaded, dizzy, feeling funny for several hours, advised EMS/ER though he declined and left the office AMA  Assessment & Plan   VT Lidocaine  >> mexiletine on 01/13/24 Continues on amiodarone  gtt No VT over the last 24 hours  Despite GDMT > LVEF remains 30-35% Sustain, VT (symptomatic but no reports of syncope) Narrow QRS Will need ICD implant Likely scar mediated given large territory WMA on his echo though pending c.MRI today  Hold brilinta  for implant planned for tomorrow as schedule allows NPO after [place] Dr. Arlester Ladd has been bedside   CAD No new CAD by cath this admission Will hold brilinta  in anticipation of ICD tomorrow Last intervention was 2016  ICM Appears well compensated Continue  meds     For questions or updates, please contact [facility] Please consult [email or web address] for contact info under     Signed, Debbie Fails, PA-C  01/14/2024, 7:25 AM

## 2024-01-14 NOTE — Plan of Care (Signed)

## 2024-01-14 NOTE — Progress Notes (Signed)
 Paged by the nurse requesting Xanax 0.25 mg to be scheduled at 10:00 today.  Appears that he is had severe anxiety throughout this admission and was getting this.  Twice daily.  I will give him a one-time dose as above.  Tomorrow morning if he needs further anxiety management consider psychiatry consult.

## 2024-01-15 ENCOUNTER — Inpatient Hospital Stay (HOSPITAL_COMMUNITY)
Admission: EM | Disposition: A | Discharge status: Home / Self Care | Payer: Self-pay | Source: Home / Self Care | Attending: Internal Medicine

## 2024-01-15 ENCOUNTER — Encounter (HOSPITAL_COMMUNITY): Payer: Self-pay | Admitting: Cardiovascular Disease

## 2024-01-15 DIAGNOSIS — I429 Cardiomyopathy, unspecified: Secondary | ICD-10-CM

## 2024-01-15 HISTORY — PX: ICD IMPLANT: EP1208

## 2024-01-15 LAB — BASIC METABOLIC PANEL WITH GFR
Anion gap: 7 (ref 5–15)
BUN: 15 mg/dL (ref 8–23)
CO2: 23 mmol/L (ref 22–32)
Calcium: 8.4 mg/dL — ABNORMAL LOW (ref 8.9–10.3)
Chloride: 104 mmol/L (ref 98–111)
Creatinine, Ser: 1.06 mg/dL (ref 0.61–1.24)
GFR, Estimated: >60 mL/min (ref >60)
Glucose, Bld: 97 mg/dL (ref 70–99)
Potassium: 3.7 mmol/L (ref 3.5–5.1)
Sodium: 134 mmol/L — ABNORMAL LOW (ref 135–145)

## 2024-01-15 SURGERY — ICD IMPLANT

## 2024-01-15 MED ORDER — CEFAZOLIN SODIUM-DEXTROSE 1-4 GM/50ML-% IV SOLN
1.0000 g | Freq: Four times a day (QID) | INTRAVENOUS | Status: AC
  Administered 2024-01-15 – 2024-01-16 (×3): 1 g via INTRAVENOUS
  Filled 2024-01-15 (×4): qty 50

## 2024-01-15 MED ORDER — FENTANYL CITRATE (PF) 100 MCG/2ML IJ SOLN
INTRAMUSCULAR | Status: AC
  Filled 2024-01-15: qty 2

## 2024-01-15 MED ORDER — MIDAZOLAM HCL 5 MG/5ML IJ SOLN
INTRAMUSCULAR | Status: DC | PRN
  Administered 2024-01-15 (×2): 1 mg via INTRAVENOUS

## 2024-01-15 MED ORDER — LIDOCAINE HCL (PF) 1 % IJ SOLN
INTRAMUSCULAR | Status: AC
  Filled 2024-01-15: qty 60

## 2024-01-15 MED ORDER — GENTAMICIN SULFATE 40 MG/ML IJ SOLN
INTRAMUSCULAR | Status: AC
  Administered 2024-01-15: 80 mg
  Filled 2024-01-15: qty 2

## 2024-01-15 MED ORDER — HEPARIN (PORCINE) IN NACL 1000-0.9 UT/500ML-% IV SOLN
INTRAVENOUS | Status: DC | PRN
  Administered 2024-01-15: 500 mL

## 2024-01-15 MED ORDER — SORBITOL 70 % SOLN
30.0000 mL | Freq: Once | Status: AC
  Administered 2024-01-15: 30 mL via ORAL
  Filled 2024-01-15: qty 30

## 2024-01-15 MED ORDER — CEFAZOLIN SODIUM-DEXTROSE 2-4 GM/100ML-% IV SOLN
INTRAVENOUS | Status: AC
Start: 2024-01-15 — End: 2024-01-15
  Administered 2024-01-15: 2 g via INTRAVENOUS
  Filled 2024-01-15: qty 100

## 2024-01-15 MED ORDER — LIDOCAINE HCL (PF) 1 % IJ SOLN
INTRAMUSCULAR | Status: DC | PRN
  Administered 2024-01-15: 50 mL

## 2024-01-15 MED ORDER — FENTANYL CITRATE (PF) 100 MCG/2ML IJ SOLN
INTRAMUSCULAR | Status: DC | PRN
  Administered 2024-01-15 (×2): 25 ug via INTRAVENOUS

## 2024-01-15 MED ORDER — MIDAZOLAM HCL 2 MG/2ML IJ SOLN
INTRAMUSCULAR | Status: AC
  Filled 2024-01-15: qty 2

## 2024-01-15 MED ORDER — AMIODARONE HCL 200 MG PO TABS
400.0000 mg | ORAL_TABLET | Freq: Two times a day (BID) | ORAL | Status: DC
  Administered 2024-01-15 – 2024-01-16 (×2): 400 mg via ORAL
  Filled 2024-01-15 (×2): qty 2

## 2024-01-15 MED ORDER — AMIODARONE HCL 200 MG PO TABS
400.0000 mg | ORAL_TABLET | ORAL | Status: AC
  Administered 2024-01-15: 400 mg via ORAL
  Filled 2024-01-15: qty 2

## 2024-01-15 SURGICAL SUPPLY — 7 items
CABLE SURGICAL S-101-97-12 (CABLE) ×1 IMPLANT
ICD VIGILANT VR D232 (Pacemaker) IMPLANT
KIT MICROPUNCTURE NIT STIFF (SHEATH) IMPLANT
LEAD RELIANCE 0672 IMPLANT
PAD DEFIB RADIO PHYSIO CONN (PAD) ×1 IMPLANT
SHEATH 8FR PRELUDE SNAP 13 (SHEATH) IMPLANT
TRAY PACEMAKER INSERTION (PACKS) ×1 IMPLANT

## 2024-01-15 NOTE — Discharge Instructions (Signed)
 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 After 7 days, you may progress as below.  You should remove your sling 24 hours after your procedure, unless otherwise instructed by your provider.     Tuesday January 22, 2024  Wednesday January 23, 2024 Thursday January 24, 2024 Friday January 25, 2024   Do not lift, push, pull, or carry anything over 10 pounds with the affected arm until 6 weeks (Tuesday February 26, 2024 ) after your procedure.   You may drive AFTER your wound check, unless you have been told otherwise by your provider.   Ask your healthcare provider when you can go back to work   INCISION/Dressing If you are on a blood thinner such as Coumadin, Xarelto, Eliquis, Plavix, or Pradaxa please confirm with your provider when this should be resumed. Do not resume your Brilinta  (ticagrelor ) until 01/21/24  If large square, outer bandage is left in place, this can be removed after 24 hours from your procedure. Do not remove steri-strips or glue as below.   Monitor your defibrillator site for redness, swelling, and drainage. Call the device clinic at [phone] if you experience these symptoms or fever/chills.  If your incision is sealed with Steri-strips or staples, you may shower 7 days after your procedure or when told by your provider. Do not remove the steri-strips or let the shower hit directly on your site. You may wash around your site with soap and water.    If you were discharged in a sling, please do not wear this during the day more than 48 hours after your surgery unless otherwise instructed. This may increase the risk of stiffness and soreness in your shoulder.   Avoid lotions, ointments, or perfumes over your incision until it is well-healed.  You may use a hot tub or a pool AFTER your wound check appointment if the incision is  completely closed.  Your ICD is designed to protect you from life threatening heart rhythms. Because of this, you may receive a shock.   1 shock with no symptoms:  Call the office during business hours. 1 shock with symptoms (chest pain, chest pressure, dizziness, lightheadedness, shortness of breath, overall feeling unwell):  Call 911. If you experience 2 or more shocks in 24 hours:  Call 911. If you receive a shock, you should not drive for 6 months per the [place] DMV IF you receive appropriate therapy from your ICD.   ICD Alerts:  Some alerts are vibratory and others beep. These are NOT emergencies. Please call our office to let us  know. If this occurs at night or on weekends, it can wait until the next business day. Send a remote transmission.  If your device is capable of reading fluid status (for heart failure), you will be offered monthly monitoring to review this with you.   DEVICE MANAGEMENT Remote monitoring is used to monitor your ICD from home. This monitoring is scheduled every 91 days by our office. It allows us  to keep an eye on the functioning of your device to ensure it is working properly. You will routinely see your Electrophysiologist annually (more often if necessary).   You should receive your ID card for your new device in 4-8 weeks. Keep this card with you at all times once received. Consider wearing a medical alert bracelet or necklace.  Your  ICD  may be MRI compatible. This will be discussed at your next office visit/wound check.  You should avoid contact with strong electric or magnetic fields.   Do not use amateur (ham) radio equipment or electric (arc) welding torches. MP3 player headphones with magnets should not be used. Some devices are safe to use if held at least 12 inches (30 cm) from your defibrillator. These include power tools, lawn mowers, and speakers. If you are unsure if something is safe to use, ask your health care provider.  When using your cell phone,  hold it to the ear that is on the opposite side from the defibrillator. Do not leave your cell phone in a pocket over the defibrillator.  You may safely use electric blankets, heating pads, computers, and microwave ovens.  Call the office right away if: You have chest pain. You feel more than one shock. You feel more short of breath than you have felt before. You feel more light-headed than you have felt before. Your incision starts to open up.  This information is not intended to replace advice given to you by your health care provider. Make sure you discuss any questions you have with your health care provider.

## 2024-01-15 NOTE — Progress Notes (Signed)
 Rounding Note   Patient Name: Jason Maldonado Date of Encounter: 01/15/2024  [facility] Cardiologist: Peter Swaziland, MD   Subjective  Some anxiety over night, declines need for further Xanax this AM in his conversation with Dr. Arlester Ladd  Scheduled Meds:  aspirin  EC  81 mg Oral Daily   carvedilol   6.25 mg Oral BID WC   chlorhexidine   60 mL Topical Once   Chlorhexidine  Gluconate Cloth  6 each Topical Daily   gentamicin (GARAMYCIN) 80 mg in sodium chloride  0.9 % 500 mL irrigation  80 mg Irrigation On Call   mexiletine  150 mg Oral Q8H   sacubitril -valsartan   1 tablet Oral BID   sodium chloride  flush  3-10 mL Intravenous Q12H   Continuous Infusions:  amiodarone  30 mg/hr (01/14/24 2234)    ceFAZolin  (ANCEF ) IV     PRN Meds: acetaminophen , ondansetron  (ZOFRAN ) IV, sodium chloride  flush   Vital Signs  Vitals:   01/15/24 0400 01/15/24 0500 01/15/24 0600 01/15/24 0610  BP: 92/62 (!) 79/59 107/69   Pulse: (!) 55 (!) 56 61 66  Resp: 16 17 18 15   Temp:    98.2 F (36.8 C)  TempSrc:    Oral  SpO2: 92% 93% 93% 94%  Weight:    60.9 kg  Height:        Intake/Output Summary (Last 24 hours) at 01/15/2024 0852 Last data filed at 01/14/2024 2158 Gross per 24 hour  Intake 164.89 ml  Output 1625 ml  Net -1460.11 ml      01/15/2024    6:10 AM 01/14/2024    3:43 AM 01/13/2024    4:24 AM  Last 3 Weights  Weight (lbs) 134 lb 4.2 oz 141 lb 15.6 oz 135 lb 5.8 oz  Weight (kg) 60.9 kg 64.4 kg 61.4 kg      Telemetry SR 60's - Personally Reviewed  ECG  No new EKGs - Personally Reviewed  Physical Exam  Examined by Dr. Arlester Ladd, unchanged GEN: No acute distress.   Neck: No JVD Cardiac: RRR, no murmurs, rubs, or gallops.  Respiratory: Clear to auscultation bilaterally. GI: Soft, nontender, non-distended  MS: No edema; No deformity. Neuro:  Nonfocal  Psych: Normal affect   Labs High Sensitivity Troponin:   Recent Labs  Lab 01/11/24 2047 01/11/24 2310  TROPONINIHS 32* 33*      Chemistry Recent Labs  Lab 01/11/24 2047 01/12/24 0329 01/13/24 0233 01/14/24 0341 01/15/24 0340  NA 137 136 133* 131* 134*  K 4.0 3.8 3.8 3.6 3.7  CL 102 107 104 102 104  CO2 22 21* 20* 21* 23  GLUCOSE 93 94 98 88 97  BUN 17 14 19 19 15   CREATININE 1.25* 1.00 1.06 1.32* 1.06  CALCIUM  9.3 8.4* 8.7* 8.4* 8.4*  MG 2.0 2.0  --   --   --   GFRNONAA >60 >60 >60 58* >60  ANIONGAP 13 8 9 8 7     Lipids No results for input(s): "CHOL", "TRIG", "HDL", "LABVLDL", "LDLCALC", "CHOLHDL" in the last 168 hours.  Hematology Recent Labs  Lab 01/11/24 2047  WBC 7.8  RBC 5.46  HGB 14.3  HCT 44.8  MCV 82.1  MCH 26.2  MCHC 31.9  RDW 15.7*  PLT 269   Thyroid   Recent Labs  Lab 01/11/24 2047  TSH 2.465    BNP Recent Labs  Lab 01/12/24 0329  BNP 600.1*    DDimer No results for input(s): "DDIMER" in the last 168 hours.   Radiology  Cardiac Studies  01/14/24: c.MRI IMPRESSION: 1. Normal left ventricular size with inferior and inferolateral thinning and akinesis, mid anterolateral thinning/akinesis, inferoseptal hypokinesis. LV EF calculated at 20%. No thrombus noted.   2. Normal RV size with calculated RV EF 24%. Visually RV function looked better than this.   3. Delayed enhancement images suggest a large myocardial infarction involving the inferior, inferolateral, and mid anterolateral walls with minimal viability.   4. Mildly elevated extracellular volume percentage at 33% likely represents scarring from prior MI.   01/12/24: TTE 1. Left ventricular ejection fraction, by estimation, is 30 to 35%. The  left ventricle has moderately decreased function. The left ventricle  demonstrates regional wall motion abnormalities (see scoring  diagram/findings for description). Left ventricular   diastolic parameters are consistent with Grade I diastolic dysfunction  (impaired relaxation). The entire septum, entire inferior wall, posterior  wall, and apex are hypokinetic.    2. Right ventricular systolic function is mildly reduced. The right  ventricular size is normal. Tricuspid regurgitation signal is inadequate  for assessing PA pressure.   3. The mitral valve is grossly normal. No evidence of mitral valve  regurgitation. No evidence of mitral stenosis.   4. The aortic valve is tricuspid. Aortic valve regurgitation is not  visualized. No aortic stenosis is present.   Comparison(s): A prior study was performed on 04/26/2023. Reported LVEF  30-35% indeterminate diastolic function, mild left atrial dilatation.   Conclusion(s)/Recommendation(s): Consider either limited echo with  definity or cMRI if no contraindication to evaluate EF and RWMA.    Coronary angiography 01/12/2024: LM: Normal LAD: Mild 20-30% disease in prox mid LAD, diag 1 Lcx: No significant disease RCA: Large dominant vessel           Patent prox-mid stents with minimal 30% late lumen loss           Mid 50% disease past the stents           Minimal disease distal RCA   LVEDP 7 mmHg   No acute ischemic etiology for VT, likely scar mediated VT Normal filling pressures Consider ICD placement, however patient seems to be very reluctant  Patient Profile   68 y.o. male w/PMHx of Bipolar, HLD, HTN, smoker CAD (inferior MI in 2014 with emergent PCI to the RCA >> second STEMI on 03/10/2015 as a inferior STEMI in the setting of RCA in-stent restenosis/occlusion  ICM  Feb 2024  WCT >> R/LHC on 01/19/2023 revealed nonobstructive CAD, continue patency of stents in the RCA, normal LV filling pressures, normal right heart pressures, reduced cardiac output  Referred to EP discussed ICD/life vest > GDMT > pt was not quite agreeable to ewither vest or implant  GDMT >> persistent reduced LVEF > recs for EP revisit and ICD > pt again declined  Admitted this visit w/ WCT > VT Arrived to a emergent/work-in visit to Dr. Swaziland in VT was c/o lightheaded, dizzy, feeling funny for several hours, advised  EMS/ER though he declined and left the office AMA  Assessment & Plan   VT Lidocaine  >> mexiletine on 01/13/24 Continues on amiodarone  gtt >  No VT over the last 24 hours  Despite GDMT > LVEF remains 30-35% Sustain, VT (symptomatic but no reports of syncope) Narrow QRS recommend ICD implant Likely scar mediated   Held brilinta  for implant planned for tomorrow as schedule allows Resume Jardiance  post procedure ICD implant scheduled for today Dr. Arlester Ladd has been bedside, pt remains agreeable  CAD No new CAD by  cath this admission Brilinta  held for ICD implant Last intervention was 2016  ICM Appears well compensated Continue meds     For questions or updates, please contact [facility] Please consult [email or web address] for contact info under     Signed, Debbie Fails, PA-C  01/15/2024, 8:52 AM

## 2024-01-16 ENCOUNTER — Other Ambulatory Visit (HOSPITAL_COMMUNITY): Payer: Self-pay

## 2024-01-16 ENCOUNTER — Inpatient Hospital Stay (HOSPITAL_COMMUNITY)

## 2024-01-16 ENCOUNTER — Encounter: Payer: Self-pay | Admitting: Emergency Medicine

## 2024-01-16 LAB — BASIC METABOLIC PANEL WITH GFR
Anion gap: 10 (ref 5–15)
BUN: 15 mg/dL (ref 8–23)
CO2: 19 mmol/L — ABNORMAL LOW (ref 22–32)
Calcium: 8.5 mg/dL — ABNORMAL LOW (ref 8.9–10.3)
Chloride: 102 mmol/L (ref 98–111)
Creatinine, Ser: 1.01 mg/dL (ref 0.61–1.24)
GFR, Estimated: >60 mL/min (ref >60)
Glucose, Bld: 92 mg/dL (ref 70–99)
Potassium: 3.7 mmol/L (ref 3.5–5.1)
Sodium: 131 mmol/L — ABNORMAL LOW (ref 135–145)

## 2024-01-16 MED ORDER — POTASSIUM CHLORIDE CRYS ER 20 MEQ PO TBCR
40.0000 meq | EXTENDED_RELEASE_TABLET | Freq: Once | ORAL | Status: AC
  Administered 2024-01-16: 40 meq via ORAL
  Filled 2024-01-16: qty 2

## 2024-01-16 MED ORDER — AMIODARONE HCL 200 MG PO TABS: 200.0000 mg | ORAL_TABLET | Freq: Every day | ORAL | 5 refills | Status: AC

## 2024-01-16 MED ORDER — AMIODARONE HCL 200 MG PO TABS
200.0000 mg | ORAL_TABLET | Freq: Every day | ORAL | 0 refills | Status: AC
  Filled 2024-01-16 (×2): qty 49, 28d supply, fill #0

## 2024-01-16 MED ORDER — MEXILETINE HCL 150 MG PO CAPS
150.0000 mg | ORAL_CAPSULE | Freq: Three times a day (TID) | ORAL | 5 refills | Status: DC
  Filled 2024-01-16: qty 90, 30d supply, fill #0

## 2024-01-16 NOTE — Discharge Summary (Signed)
 ELECTROPHYSIOLOGY PROCEDURE DISCHARGE SUMMARY    Patient ID: Jason Maldonado,  MRN: [id number], DOB/AGE: [birth year] 68 y.o.  Admit date: 01/11/2024 Discharge date: 01/16/2024  Primary Care Physician: de [place], Alonza Jansky, MD  Primary Cardiologist: Dr. Swaziland Electrophysiologist: Dr. Arlester Ladd  Primary Discharge Diagnosis:  VT  Secondary Discharge Diagnosis:  CAD ICM Chronic CHF (systolic) Well compensated HLD Biopar d/o  Allergies  Allergen Reactions   Nsaids Other (See Comments)    Interaction with Brilinta ; causes internal bleeding     Procedures This Admission:   Coronary angiography 01/12/2024: LM: Normal LAD: Mild 20-30% disease in prox mid LAD, diag 1 Lcx: No significant disease RCA: Large dominant vessel           Patent prox-mid stents with minimal 30% late lumen loss           Mid 50% disease past the stents           Minimal disease distal RCA   LVEDP 7 mmHg   No acute ischemic etiology for VT, likely scar mediated VT Normal filling pressures Consider ICD placement, however patient seems to be very reluctant   2.   Implantation of a BSci single chamber ICD on 01/15/24 by Dr Arlester Ladd.   DFT's were deferred at time of implant There were no immediate post procedure complications. CXR on 01/16/24 demonstrated no pneumothorax status post device implantation.   Brief HPI: Jason Maldonado is a 68 y.o. male w/PMHx as above, with known ICM, historically w/WCT and discussion of ICD in the past that he had not wanted to pursue Despite GDMT LVEF remained reduced, though no clinical CHF hospitalizations. He had an urgen clinic visit day of admission with Dr. Swaziland with weakness, palpitations,  and found in VT advised EMS transport to ER though he declined He did though eventually seek attention in the ER was in VT (170's) and started on amiodarone  gtt that CV him to SR, he was admitted  Hospital Course:  The patient was also started on lidocaine  gtt.  With no  recurrent VT transitioned to mexiletine. Labs were largely unremarkable. LVEF 30-35% by echo, LHC with stable non-obstructive CAD, patent RCA stents.  C.MRI c/w scar (minimal viability).  The patient initially was not agreeable to ICD though with further discussions did and  underwent implantation of anICD with details as outlined in the procedure report. He was transitioned to PO amiodarone .  He was monitored on telemetry throughout his stay without recurrent VT.  Left chest was without hematoma or ecchymosis.  The device was interrogated and found to be functioning normally.  CXR was obtained and demonstrated no pneumothorax status post device implantation.  Wound care, arm mobility, and restrictions were reviewed with the patient.  He works on Psychologist, counselling, we discussed at [employer] his LUE restrictions, he reported he would likely just stay on FMLA, unless they could get him on limited duty within his restrictions.  The patient feels well, as ambulated without difficulty, denies any CP or SOB, minimal implant site discomfort, no cath site discomfort or concerns he was examined by Dr. Arlester Ladd and considered stable for discharge to home.   Home with amiodarone  400mg  BID for 1 week Mexiletine 150mg  TOD Resume Brilinta  01/21/24 Given no syncopepy and HD stability with his VT, no formal driving restrictions driving   Physical Exam: Vitals:   01/16/24 0555 01/16/24 0600 01/16/24 0700 01/16/24 0800  BP:  99/61 114/80 126/75  Pulse:  68 60 78  Resp:  14 17 17   Temp:   97.7 F (36.5 C)   TempSrc:   Axillary   SpO2:  93% 95% 91%  Weight: 62.1 kg     Height:        GEN- The patient is well appearing, alert and oriented x 3 today.   HEENT: normocephalic, atraumatic; sclera clear, conjunctiva pink; hearing intact; oropharynx clear Lungs- CTA b/l, normal work of breathing.  No wheezes, rales, rhonchi Heart- RRR, no murmurs, rubs or gallops, PMI not laterally displaced GI- soft, non-tender,  non-distended Extremities- no clubbing, cyanosis, or edema MS- no significant deformity or atrophy Skin- warm and dry, no rash or lesion, left chest without hematoma, slight ecchymosis inf/laterally to the wound Psych- euthymic mood, full affect Neuro- no gross defecits  Labs:   Lab Results  Component Value Date   WBC 7.8 01/11/2024   HGB 14.3 01/11/2024   HCT 44.8 01/11/2024   MCV 82.1 01/11/2024   PLT 269 01/11/2024    Recent Labs  Lab 01/16/24 0224  NA 131*  K 3.7  CL 102  CO2 19*  BUN 15  CREATININE 1.01  CALCIUM  8.5*  GLUCOSE 92    Discharge Medications:  Allergies as of 01/16/2024       Reactions   Nsaids Other (See Comments)   Interaction with Brilinta ; causes internal bleeding        Medication List     TAKE these medications    acetaminophen  650 MG CR tablet Commonly known as: TYLENOL  Take 650 mg by mouth every 8 (eight) hours as needed for pain.   amiodarone  200 MG tablet Commonly known as: PACERONE  Take 1 tablet (200 mg total) by mouth daily. START by taking 400mg  (2 tablets) by mouth twice daily for 1 week, then 200mg  daily   amiodarone  200 MG tablet Commonly known as: Pacerone  Take 1 tablet (200 mg total) by mouth daily. Start taking on: February 15, 2024   aspirin  EC 81 MG tablet Take 1 tablet (81 mg total) by mouth daily.   Brilinta  60 MG Tabs tablet Generic drug: ticagrelor  TAKE 1 TABLET BY MOUTH 2 TIMES DAILY. Notes to patient: Do not resume until 01/21/24   carvedilol  6.25 MG tablet Commonly known as: COREG  TAKE 1 TABLET BY MOUTH TWICE A DAY   docusate sodium  100 MG capsule Commonly known as: COLACE Take 100 mg by mouth 2 (two) times daily.   Jardiance  10 MG Tabs tablet Generic drug: empagliflozin  TAKE 1 TABLET BY MOUTH DAILY BEFORE BREAKFAST.   loratadine 10 MG tablet Commonly known as: CLARITIN Take 10 mg by mouth daily as needed for allergies.   mexiletine 150 MG capsule Commonly known as: MEXITIL Take 1 capsule (150 mg  total) by mouth every 8 (eight) hours.   nitroGLYCERIN  0.4 MG SL tablet Commonly known as: NITROSTAT  Take as needed for chest pain. 1 tablet under tongue every 5 mins up to 3 tablets.After 3 tablets if chest pain call 911.   sacubitril -valsartan  24-26 MG Commonly known as: ENTRESTO  Take 1 tablet by mouth 2 (two) times daily.        Disposition: home Discharge Instructions     Diet - low sodium heart healthy   Complete by: As directed    Increase activity slowly   Complete by: As directed         Duration of Discharge Encounter: 13 minutes, APP time.  [place], PA-C 01/16/2024 10:01 AM

## 2024-01-16 NOTE — TOC Initial Note (Signed)
 Transition of Care [facility]) - Initial/Assessment Note    Patient Details  Name: Jason Maldonado MRN: [id number] Date of Birth: Aug 07, 1956  [facility]) CM/SW Contact:    [name], RN Phone Number: 01/16/2024, 11:00 AM  Clinical Narrative:  Patient presented for palpitations. PTA patient was independent from home alone. Patient's spouse lives in [place] and the patient has support of son. Spouse states she is moving back to [place] in July. Patient reports that he has not seen PCP in a while; however, he dose follow up with Cardiology. Patient states he has transportation home. No home needs identified during the visit.                   Expected Discharge Plan: Home/Self Care Barriers to Discharge: No Barriers Identified   Patient Goals and CMS Choice Patient states their goals for this hospitalization and ongoing recovery are:: Plan to return home          Expected Discharge Plan and Services In-house Referral: NA Discharge Planning Services: CM Consult   Living arrangements for the past 2 months: Apartment Expected Discharge Date: 01/16/24                         [facility] Arranged: NA          Prior Living Arrangements/Services Living arrangements for the past 2 months: Apartment Lives with:: Self (Wife Lives in [place])   Do you feel safe going back to the place where you live?: Yes      Need for Family Participation in Patient Care: No (Comment) Care giver support system in place?: No (comment)   Criminal Activity/Legal Involvement Pertinent to Current Situation/Hospitalization: No - Comment as needed  Activities of Daily Living   ADL Screening (condition at time of admission) Independently performs ADLs?: Yes (appropriate for developmental age) Is the patient deaf or have difficulty hearing?: No Does the patient have difficulty seeing, even when wearing glasses/contacts?: No Does the patient have difficulty concentrating,  remembering, or making decisions?: No  Permission Sought/Granted Permission sought to share information with : Family Supports, Case Manager                Emotional Assessment Appearance:: Appears stated age Attitude/Demeanor/Rapport: Engaged Affect (typically observed): Appropriate Orientation: : Oriented to Self, Oriented to Place, Oriented to  Time, Oriented to Situation Alcohol / Substance Use: Not Applicable Psych Involvement: No (comment)  Admission diagnosis:  VT (ventricular tachycardia) (HCC) [I47.20] Patient Active Problem List   Diagnosis Date Noted   VT (ventricular tachycardia) (HCC) 01/11/2024   Wrist pain 06/05/2023   Statin myopathy 03/06/2023   Major depressive disorder, recurrent episode, moderate (HCC) 05/27/2018   GAD (generalized anxiety disorder) 05/27/2018   Bilateral shoulder pain 05/09/2016   Rotator cuff tendinitis 05/09/2016   Chronic systolic CHF (congestive heart failure) (HCC) 04/27/2015   Acute ST elevation myocardial infarction (STEMI) involving other coronary artery of inferior wall (HCC) 03/10/2015   PAF (paroxysmal atrial fibrillation) (HCC) 03/10/2015   ST elevation myocardial infarction (STEMI) involving right coronary artery in recovery phase (HCC) 03/10/2015   Acute MI, inferior wall (HCC) 03/10/2015   LV dysfunction    Cardiogenic shock (HCC) 10/01/2012   ST elevation myocardial infarction (STEMI) of inferior wall (HCC) 09/27/2012   Hyperlipidemia 09/27/2012   Tobacco abuse 09/27/2012   CAD (coronary artery disease) 09/27/2012   Bipolar 1 disorder (HCC)    PCP:  de [place], Raymond J, MD  Pharmacy:   [id number] [place], [place] - [address] [place] [address] Phone: [phone] Fax: [phone]  [facility] [address] [place] [address] Phone: [phone] Fax: [phone]     Social Drivers of Health (SDOH) Social History: SDOH Screenings   Food  Insecurity: No Food Insecurity (01/12/2024)  Housing: Unknown (01/12/2024)  Transportation Needs: No Transportation Needs (01/12/2024)  Utilities: Not At Risk (01/12/2024)  Depression (PHQ2-9): Low Risk  (09/18/2023)  Social Connections: Unknown (01/12/2024)  Tobacco Use: High Risk (01/11/2024)   SDOH Interventions:     Readmission Risk Interventions     No data to display

## 2024-01-17 ENCOUNTER — Telehealth: Payer: Self-pay

## 2024-01-17 NOTE — Transitions of Care (Post Inpatient/ED Visit) (Signed)
   01/17/2024  Name: GRADIE OHM MRN: [id number] DOB: 1956/02/12  Today's TOC FU Call Status: Today's TOC FU Call Status:: Unsuccessful Call (1st Attempt) Unsuccessful Call (1st Attempt) Date: 01/17/24  Attempted to reach the patient regarding the most recent Inpatient/ED visit.  Follow Up Plan: Additional outreach attempts will be made to reach the patient to complete the Transitions of Care (Post Inpatient/ED visit) call.   Orpha Blade, RN, BSN, CEN [employer]- Transition of Care Team.  [facility] [phone]

## 2024-01-18 ENCOUNTER — Telehealth: Payer: Self-pay

## 2024-01-18 MED FILL — Midazolam HCl Inj 2 MG/2ML (Base Equivalent): INTRAMUSCULAR | Qty: 2 | Status: AC

## 2024-01-18 NOTE — Transitions of Care (Post Inpatient/ED Visit) (Signed)
   01/18/2024  Name: Jason Maldonado MRN: [id number] DOB: 03-Mar-1956  Today's TOC FU Call Status: Today's TOC FU Call Status:: Unsuccessful Call (2nd Attempt) Unsuccessful Call (2nd Attempt) Date: 01/18/24  Attempted to reach the patient regarding the most recent Inpatient/ED visit.  Follow Up Plan: Additional outreach attempts will be made to reach the patient to complete the Transitions of Care (Post Inpatient/ED visit) call.   Orpha Blade, RN, BSN, CEN [employer]- Transition of Care Team.  [facility] [phone]

## 2024-01-18 NOTE — Transitions of Care (Post Inpatient/ED Visit) (Signed)
   01/18/2024  Name: Jason Maldonado MRN: [id number] DOB: 04/23/1956  Today's TOC FU Call Status: Today's TOC FU Call Status:: Unsuccessful Call (3rd Attempt) Unsuccessful Call (3rd Attempt) Date: 01/18/24  Attempted to reach the patient regarding the most recent Inpatient/ED visit.  Follow Up Plan: No further outreach attempts will be made at this time. We have been unable to contact the patient.  Orpha Blade, RN, BSN, CEN [employer]- Transition of Care Team.  [facility] [phone]

## 2024-01-21 ENCOUNTER — Telehealth: Payer: Self-pay | Admitting: Cardiovascular Disease

## 2024-01-21 NOTE — Telephone Encounter (Signed)
 Returned call to pt- patient states he thinks one of his medications may be causing tremors. He states they  started a couple days after being discharged from the hospital.

## 2024-01-21 NOTE — Telephone Encounter (Signed)
 Pt c/o medication issue:  1. Name of Medication:   amiodarone  (PACERONE ) 200 MG tablet  mexiletine (MEXITIL ) 150 MG capsule  2. How are you currently taking this medication (dosage and times per day)?  As written  3. Are you having a reaction (difficulty breathing--STAT)? No   4. What is your medication issue? Pt is calling because he feels one of these medications is causing him to have tremors

## 2024-01-22 NOTE — Telephone Encounter (Signed)
 Returned call to pt- patient has not yet reached the 200mg  daily dose. He will start taking the amiodarone  daily. He will call in one week with an update.

## 2024-01-29 ENCOUNTER — Ambulatory Visit: Attending: Cardiology

## 2024-01-29 DIAGNOSIS — I472 Ventricular tachycardia, unspecified: Secondary | ICD-10-CM | POA: Diagnosis not present

## 2024-01-29 NOTE — Progress Notes (Signed)
 Normal single chamber ICD wound check. Wound well healed. Presenting rhythm: VS 65. Routine testing performed. Thresholds, sensing, and impedances consistent with implant measurements with 3.5V safety margin/auto capture until 3 month visit. No treated arrhythmias. Reviewed arm restrictions to continue for 6 weeks total post op. Reviewed shock plan.  Pt enrolled in remote follow-up.  FMLA/work return note given to Chriss Coup, RN who will complete. Patient updated to plan.

## 2024-01-29 NOTE — Patient Instructions (Signed)

## 2024-01-30 ENCOUNTER — Other Ambulatory Visit (HOSPITAL_COMMUNITY): Payer: Self-pay

## 2024-02-13 NOTE — Progress Notes (Unsigned)
 Electrophysiology Office Note:   Date:  02/14/2024  ID:  Jason Maldonado, DOB May 09, 1956, MRN [id number]  Primary Cardiologist: Peter Swaziland, MD Primary Heart Failure: None Electrophysiologist: Eulas FORBES Furbish, MD       History of Present Illness:   Jason Maldonado is a 68 y.o. male with h/o AF, VT, CAD s/p inferior MI, HFrEF, HLD, Bipolar disorder, anxiety, tobacco abuse seen today for routine electrophysiology follow-up s/p Defibrillator implant.  He was admitted 5/30-01/16/24 with weakness, palpitations and found to be in VT.  He was started on amiodarone  / lidocaine . VT did not recur and he was transitioned to mexiletine.  ECHO showed LVEF 30-35%, LHC with stable non-obs CAD, patent RCA stent. cMRI demonstrated scar with minimal viability. He was transitioned to PO amiodarone . ICD was ultimately placed. He was discharged on mexiletine 150 mg + brilinta . No formal driving restrictions given no syncope.   He called the clinic on 01/21/24 reporting tremors and concerned his medications were causing the issue.   Since last being seen in our clinic the patient reports he is second guessing having the device.  He has questions about which medication is causing him tremor.  He is convinced it is the Mexitil .    He denies chest pain, palpitations, dyspnea, PND, orthopnea, nausea, vomiting, dizziness, syncope, edema, weight gain, or early satiety.    Review of systems complete and found to be negative unless listed in HPI.   EP Information / Studies Reviewed:    EKG is ordered today. Personal review as below.  EKG Interpretation Date/Time:  Thursday February 14 2024 08:29:32 EDT Ventricular Rate:  64 PR Interval:  246 QRS Duration:  118 QT Interval:  428 QTC Calculation: 441 R Axis:   25  Text Interpretation: Sinus rhythm with 1st degree A-V block Confirmed by Aniceto Jarvis ([id number]) on 02/14/2024 8:42:11 AM   ICD Interrogation-  reviewed in detail today,  See PACEART report.  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 VT History of appropriate therapy: No History of AAD therapy: Yes; currently on amiodarone , mexiletine       Physical Exam:   VS:  BP (!) 144/98   Pulse 64   Ht 5' 7 (1.702 m)   Wt 139 lb 3.2 oz (63.1 kg)   SpO2 98%   BMI 21.80 kg/m    Wt Readings from Last 3 Encounters:  02/14/24 139 lb 3.2 oz (63.1 kg)  01/16/24 136 lb 14.5 oz (62.1 kg)  01/11/24 143 lb 3.2 oz (65 kg)     GEN: Well nourished, well developed in no acute distress NECK: No JVD; No carotid bruits CARDIAC: Regular rate and rhythm, no murmurs, rubs, gallops RESPIRATORY:  Clear to auscultation without rales, wheezing or rhonchi  ABDOMEN: Soft, non-tender, non-distended EXTREMITIES:  No edema; No deformity   ASSESSMENT AND PLAN:    Chronic Systolic Dysfunction / ICM s/p [employer] single chamber ICD  Ventricular Tachycardia  High Risk Drug Monitoring: Amiodarone  -euvolemic on exam / by device  -Stable on an appropriate medical regimen -Normal ICD function -See Pace Art report -No programming changes today, VP<1% -continue amiodarone   -update amiodarone  labs > CMP, TSH / free T4    -reduce mexiletine to 200 mg BID > hopeful to reduce tremor -GDMT per Cardiology, follows with Dr. Swaziland  Disposition:   Follow up with Dr. Furbish in 6 months    Signed, Jarvis Aniceto, NP-C, AGACNP-BC [facility] - Electrophysiology  02/14/2024, 12:00 PM

## 2024-02-14 ENCOUNTER — Encounter: Payer: Self-pay | Admitting: Pulmonary Disease

## 2024-02-14 ENCOUNTER — Ambulatory Visit: Attending: Pulmonary Disease | Admitting: Pulmonary Disease

## 2024-02-14 VITALS — BP 144/98 | HR 64 | Ht 67.0 in | Wt 139.2 lb

## 2024-02-14 DIAGNOSIS — I472 Ventricular tachycardia, unspecified: Secondary | ICD-10-CM

## 2024-02-14 DIAGNOSIS — Z95810 Presence of automatic (implantable) cardiac defibrillator: Secondary | ICD-10-CM | POA: Diagnosis not present

## 2024-02-14 DIAGNOSIS — I255 Ischemic cardiomyopathy: Secondary | ICD-10-CM

## 2024-02-14 DIAGNOSIS — I5042 Chronic combined systolic (congestive) and diastolic (congestive) heart failure: Secondary | ICD-10-CM

## 2024-02-14 MED ORDER — MEXILETINE HCL 200 MG PO CAPS: 200.0000 mg | ORAL_CAPSULE | Freq: Two times a day (BID) | ORAL | 3 refills | Status: AC

## 2024-02-14 NOTE — Patient Instructions (Signed)
 Medication Instructions:  Increase mexiletine to 200 mg twice daily *If you need a refill on your cardiac medications before your next appointment, please call your pharmacy*  Lab Work: CMET, TSH, FreeT4-TODAY If you have labs (blood work) drawn today and your tests are completely normal, you will receive your results only by: MyChart Message (if you have MyChart) OR A paper copy in the mail If you have any lab test that is abnormal or we need to change your treatment, we will call you to review the results.  Follow-Up: At [facility], you and your health needs are our priority.  As part of our continuing mission to provide you with exceptional heart care, our providers are all part of one team.  This team includes your primary Cardiologist (physician) and Advanced Practice Providers or APPs (Physician Assistants and Nurse Practitioners) who all work together to provide you with the care you need, when you need it.  Your next appointment:   6 month(s)  Provider:   Daphne Barrack, NP   1 year with Dr Nancey

## 2024-02-15 ENCOUNTER — Ambulatory Visit: Payer: Self-pay | Admitting: Pulmonary Disease

## 2024-02-15 LAB — COMPREHENSIVE METABOLIC PANEL WITH GFR
ALT: 13 IU/L (ref 0–44)
AST: 15 IU/L (ref 0–40)
Albumin: 4.2 g/dL (ref 3.9–4.9)
Alkaline Phosphatase: 80 IU/L (ref 44–121)
BUN/Creatinine Ratio: 20 (ref 10–24)
BUN: 20 mg/dL (ref 8–27)
Bilirubin Total: 0.2 mg/dL (ref 0.0–1.2)
CO2: 21 mmol/L (ref 20–29)
Calcium: 8.9 mg/dL (ref 8.6–10.2)
Chloride: 103 mmol/L (ref 96–106)
Creatinine, Ser: 0.99 mg/dL (ref 0.76–1.27)
Globulin, Total: 2.2 g/dL (ref 1.5–4.5)
Glucose: 78 mg/dL (ref 70–99)
Potassium: 4.5 mmol/L (ref 3.5–5.2)
Sodium: 139 mmol/L (ref 134–144)
Total Protein: 6.4 g/dL (ref 6.0–8.5)
eGFR: 82 mL/min/1.73 (ref >59)

## 2024-02-15 LAB — T4, FREE: Free T4: 1.33 ng/dL (ref 0.82–1.77)

## 2024-02-15 LAB — TSH: TSH: 3.27 u[IU]/mL (ref 0.450–4.500)

## 2024-02-17 ENCOUNTER — Other Ambulatory Visit: Payer: Self-pay | Admitting: Cardiology

## 2024-02-17 ENCOUNTER — Other Ambulatory Visit: Payer: Self-pay | Admitting: Cardiovascular Disease

## 2024-02-18 ENCOUNTER — Ambulatory Visit: Admitting: Orthopedic Surgery

## 2024-02-28 ENCOUNTER — Ambulatory Visit

## 2024-02-28 DIAGNOSIS — I255 Ischemic cardiomyopathy: Secondary | ICD-10-CM | POA: Diagnosis not present

## 2024-02-29 LAB — CUP PACEART REMOTE DEVICE CHECK
Battery Remaining Longevity: 180 mo
Battery Remaining Percentage: 100 %
Brady Statistic RV Percent Paced: 0 %
Date Time Interrogation Session: 20250717010100
HighPow Impedance: 70 Ohm
Implantable Lead Connection Status: 753985
Implantable Lead Implant Date: 20250603
Implantable Lead Location: 753860
Implantable Lead Model: 672
Implantable Lead Serial Number: 275425
Implantable Pulse Generator Implant Date: 20250603
Lead Channel Impedance Value: 424 Ohm
Lead Channel Pacing Threshold Amplitude: 0.9 V
Lead Channel Pacing Threshold Pulse Width: 0.4 ms
Lead Channel Setting Pacing Amplitude: 3.5 V
Lead Channel Setting Pacing Pulse Width: 0.4 ms
Lead Channel Setting Sensing Sensitivity: 0.5 mV
Pulse Gen Serial Number: 350929
Zone Setting Status: 755011

## 2024-03-08 ENCOUNTER — Ambulatory Visit: Payer: Self-pay | Admitting: Cardiovascular Disease

## 2024-03-17 ENCOUNTER — Encounter (HOSPITAL_BASED_OUTPATIENT_CLINIC_OR_DEPARTMENT_OTHER): Payer: BC Managed Care – PPO | Admitting: Family Medicine

## 2024-04-09 ENCOUNTER — Emergency Department (HOSPITAL_BASED_OUTPATIENT_CLINIC_OR_DEPARTMENT_OTHER)
Admission: EM | Admit: 2024-04-09 | Discharge: 2024-04-09 | Discharge status: Against Medical Advice | Attending: Emergency Medicine | Admitting: Emergency Medicine

## 2024-04-09 ENCOUNTER — Emergency Department (HOSPITAL_BASED_OUTPATIENT_CLINIC_OR_DEPARTMENT_OTHER): Admitting: Radiology

## 2024-04-09 ENCOUNTER — Ambulatory Visit: Payer: Self-pay

## 2024-04-09 ENCOUNTER — Other Ambulatory Visit: Payer: Self-pay

## 2024-04-09 DIAGNOSIS — F17200 Nicotine dependence, unspecified, uncomplicated: Secondary | ICD-10-CM | POA: Diagnosis not present

## 2024-04-09 DIAGNOSIS — R0981 Nasal congestion: Secondary | ICD-10-CM | POA: Insufficient documentation

## 2024-04-09 DIAGNOSIS — R059 Cough, unspecified: Secondary | ICD-10-CM | POA: Diagnosis not present

## 2024-04-09 DIAGNOSIS — I2510 Atherosclerotic heart disease of native coronary artery without angina pectoris: Secondary | ICD-10-CM | POA: Diagnosis not present

## 2024-04-09 DIAGNOSIS — I509 Heart failure, unspecified: Secondary | ICD-10-CM | POA: Insufficient documentation

## 2024-04-09 DIAGNOSIS — Z7982 Long term (current) use of aspirin: Secondary | ICD-10-CM | POA: Diagnosis not present

## 2024-04-09 DIAGNOSIS — R0602 Shortness of breath: Secondary | ICD-10-CM | POA: Insufficient documentation

## 2024-04-09 DIAGNOSIS — Z95810 Presence of automatic (implantable) cardiac defibrillator: Secondary | ICD-10-CM | POA: Insufficient documentation

## 2024-04-09 DIAGNOSIS — R062 Wheezing: Secondary | ICD-10-CM | POA: Diagnosis not present

## 2024-04-09 LAB — CBC
HCT: 44.2 % (ref 39.0–52.0)
Hemoglobin: 14.3 g/dL (ref 13.0–17.0)
MCH: 25.8 pg — ABNORMAL LOW (ref 26.0–34.0)
MCHC: 32.4 g/dL (ref 30.0–36.0)
MCV: 79.8 fL — ABNORMAL LOW (ref 80.0–100.0)
Platelets: 201 K/uL (ref 150–400)
RBC: 5.54 MIL/uL (ref 4.22–5.81)
RDW: 14.1 % (ref 11.5–15.5)
WBC: 7.4 K/uL (ref 4.0–10.5)
nRBC: 0 % (ref 0.0–0.2)

## 2024-04-09 LAB — RESP PANEL BY RT-PCR (RSV, FLU A&B, COVID)  RVPGX2
Influenza A by PCR: NEGATIVE
Influenza B by PCR: NEGATIVE
Resp Syncytial Virus by PCR: NEGATIVE
SARS Coronavirus 2 by RT PCR: NEGATIVE

## 2024-04-09 LAB — BASIC METABOLIC PANEL WITH GFR
Anion gap: 14 (ref 5–15)
BUN: 13 mg/dL (ref 8–23)
CO2: 23 mmol/L (ref 22–32)
Calcium: 9.4 mg/dL (ref 8.9–10.3)
Chloride: 94 mmol/L — ABNORMAL LOW (ref 98–111)
Creatinine, Ser: 1.14 mg/dL (ref 0.61–1.24)
GFR, Estimated: >60 mL/min (ref >60)
Glucose, Bld: 89 mg/dL (ref 70–99)
Potassium: 4.1 mmol/L (ref 3.5–5.1)
Sodium: 131 mmol/L — ABNORMAL LOW (ref 135–145)

## 2024-04-09 LAB — PRO BRAIN NATRIURETIC PEPTIDE: Pro Brain Natriuretic Peptide: 2796 pg/mL — ABNORMAL HIGH (ref <300.0)

## 2024-04-09 LAB — TROPONIN T, HIGH SENSITIVITY: Troponin T High Sensitivity: <15 ng/L (ref 0–19)

## 2024-04-09 MED ORDER — ALBUTEROL SULFATE HFA 108 (90 BASE) MCG/ACT IN AERS
2.0000 | INHALATION_SPRAY | Freq: Once | RESPIRATORY_TRACT | Status: AC
  Administered 2024-04-09: 2 via RESPIRATORY_TRACT
  Filled 2024-04-09: qty 6.7

## 2024-04-09 MED ORDER — DEXAMETHASONE 4 MG PO TABS
10.0000 mg | ORAL_TABLET | Freq: Once | ORAL | Status: AC
  Administered 2024-04-09: 10 mg via ORAL
  Filled 2024-04-09: qty 3

## 2024-04-09 NOTE — ED Notes (Signed)
 Lab drawn for second troponin and walked to lab.  Lab made aware that blood is to be used for 2nd troponin

## 2024-04-09 NOTE — ED Triage Notes (Signed)
 Patient reports chest discomfort from nasal drainage. He says he's he's noticed some wheezing and shortness of breath. He also reports a cardiac history and has a defibrillator.

## 2024-04-09 NOTE — ED Notes (Signed)
 Patient left AMA.  Refused to sign any paperwork at time of departure.  Voiced understanding about importance of staying and wanting to leave AMA.  MD at bedside when patient stated he would not be waiting for any further treatment.

## 2024-04-09 NOTE — ED Provider Notes (Signed)
 [place] EMERGENCY DEPARTMENT AT [facility] Provider Note   CSN: [id number] Arrival date & time: 04/09/24  1759     Patient presents with: Cough and Shortness of Breath   Jason Maldonado is a 68 y.o. male.  {Add pertinent medical, surgical, social history, OB history to HPI:32947} HPI     68 year old male with a history of coronary artery disease, hyperlipidemia, paroxysmal atrial fibrillation, VT with defibrillator in place, CHF with last echo with an EF of 30 to 35%, depression, tobacco use  Shortness of breath with exertion, fairly new but nothing intense  Terrible congestion from air conditioned spaces Congestion and cough, was productive but now has the wheezing with it  Not more short of breath laying down flat No leg swelling No fever No chest pain or pressure, no arm pain, neck pain, abdominal pain No nausea or vomiting  No dizziness or lightheadedness  Has smoked but not for last few days  Past Medical History:  Diagnosis Date   Bipolar 1 disorder (HCC)    CAD (coronary artery disease)    a. STEMI 09/2012  single vessel occlusive CAD of the RCA s/p DES to mid RCA, EF 45%;  b. inferior STEMI 03/10/2015 DES to prox and mid RCA   Depression    Hyperlipidemia    LV dysfunction    a. 09/2012 Cath LV gram severe basal to midinferior wall hypokinesis, EF 45% and echo EF 55%.   PAF (paroxysmal atrial fibrillation) (HCC)    a. noted on admission 02/2015 in setting of inf stemi;  b. CHA2DS2VASc = 1.   Tobacco abuse      Prior to Admission medications   Medication Sig Start Date End Date Taking? Authorizing Provider  acetaminophen  (TYLENOL ) 500 MG tablet Take 1,000 mg by mouth every 6 (six) hours as needed (take 1-2 tablets as needed for pain).    [provider]  amiodarone  (PACERONE ) 200 MG tablet START by taking 400mg  (2 tablets) by mouth twice daily for 1 week, then take 1 tablet (200 mg total) by mouth daily. Patient not taking: Reported on  02/14/2024 01/16/24   Leverne Charlies Helling, PA-C  amiodarone  (PACERONE ) 200 MG tablet Take 1 tablet (200 mg total) by mouth daily. 02/15/24   Leverne Charlies Helling, PA-C  aspirin  EC 81 MG EC tablet Take 1 tablet (81 mg total) by mouth daily. 10/02/12   Hope, Jessica A, PA-C  BRILINTA  60 MG TABS tablet TAKE 1 TABLET BY MOUTH 2 TIMES DAILY. 02/19/24   [place], Peter M, MD  carvedilol  (COREG ) 6.25 MG tablet TAKE 1 TABLET BY MOUTH TWICE A DAY 02/19/24   [place], Peter M, MD  docusate sodium  (COLACE) 100 MG capsule Take 100 mg by mouth 2 (two) times daily.    [provider]  empagliflozin  (JARDIANCE ) 10 MG TABS tablet TAKE 1 TABLET BY MOUTH DAILY BEFORE BREAKFAST. 01/09/24   [place], Peter M, MD  loratadine (CLARITIN) 10 MG tablet Take 10 mg by mouth daily as needed for allergies.    [provider]  mexiletine (MEXITIL ) 200 MG capsule Take 1 capsule (200 mg total) by mouth 2 (two) times daily. 02/14/24   Aniceto Daphne CROME, NP  nitroGLYCERIN  (NITROSTAT ) 0.4 MG SL tablet Take as needed for chest pain. 1 tablet under tongue every 5 mins up to 3 tablets.After 3 tablets if chest pain call 911. 09/25/22   Daneen Damien BROCKS, NP  sacubitril -valsartan  (ENTRESTO ) 24-26 MG TAKE 1 TABLET BY MOUTH TWICE A DAY 02/19/24  [place], Peter M, MD    Allergies: Nsaids    Review of Systems  Updated Vital Signs BP (!) 164/98   Pulse 83   Temp 98.6 F (37 C) (Oral)   Resp (!) 24   Ht 5' 7 (1.702 m)   Wt 68 kg   SpO2 97%   BMI 23.49 kg/m   Physical Exam  (all labs ordered are listed, but only abnormal results are displayed) Labs Reviewed  BASIC METABOLIC PANEL WITH GFR - Abnormal; Notable for the following components:      Result Value   Sodium 131 (*)    Chloride 94 (*)    All other components within normal limits  CBC - Abnormal; Notable for the following components:   MCV 79.8 (*)    MCH 25.8 (*)    All other components within normal limits  RESP PANEL BY RT-PCR (RSV, FLU A&B, COVID)  [id number]    EKG: EKG  Interpretation Date/Time:  Wednesday April 09 2024 18:11:37 EDT Ventricular Rate:  80 PR Interval:  220 QRS Duration:  118 QT Interval:  392 QTC Calculation: 452 R Axis:   43  Text Interpretation: Sinus rhythm with 1st degree A-V block Left ventricular hypertrophy with QRS widening and repolarization abnormality ( Sokolow-Lyon ) Abnormal ECG When compared with ECG of 14-Feb-2024 08:29, No significant change since last tracing Confirmed by Dreama Longs ([id number]) on 04/09/2024 7:16:15 PM  Radiology: [facility] Chest 2 View Result Date: 04/09/2024 CLINICAL DATA:  Shortness of breath EXAM: CHEST - 2 VIEW COMPARISON:  Chest x-ray 01/16/2024 FINDINGS: Left chest wall single lead defibrillator. The heart and mediastinal contours are unchanged. No focal consolidation. No pulmonary edema. No pleural effusion. No pneumothorax. No acute osseous abnormality. IMPRESSION: No active cardiopulmonary disease. Electronically Signed   By: Morgane  Naveau M.D.   On: 04/09/2024 18:50    {Document cardiac monitor, telemetry assessment procedure when appropriate:32947} Procedures   Medications Ordered in the ED - No data to display    {Click here for ABCD2, HEART and other calculators REFRESH Note before signing:1}                               ***  EKG was completed and showed no clinically significant change from prior.  Chest x-ray shows no evidence of pneumonia, pulmonary edema or pneumothorax.  Labs completed and personally reviewed interpreted by me showed global clinically significant electrolyte abnormalities, no anemia or leukocytosis.  COVID and flu test were negative  {Document critical care time when appropriate  Document review of labs and clinical decision tools ie CHADS2VASC2, etc  Document your independent review of radiology images and any outside records  Document your discussion with family members, caretakers and with consultants  Document social determinants of health affecting pt's  care  Document your decision making why or why not admission, treatments were needed:32947:::1}   Final diagnoses:  None    ED Discharge Orders     None

## 2024-04-09 NOTE — Telephone Encounter (Signed)
 FYI Only or Action Required?: FYI only for provider.  Patient was last seen in primary care on 09/18/2023 by [place], Quintin PARAS, MD.  Called Nurse Triage reporting Shortness of Breath.  Symptoms began several days ago.  Interventions attempted: OTC medications: Allergy meds.  Symptoms are: gradually worsening.  Triage Disposition: See HCP Within 4 Hours (Or PCP Triage)  Patient/caregiver understands and will follow disposition?:      Copied from CRM [id number]. Topic: Clinical - Red Word Triage >> Apr 09, 2024  4:33 PM Marissa P wrote: Red Word that prompted transfer to Nurse Triage: Patient is currently having mild shortness of breath and wheezing while exhaling. Reason for Disposition  [1] MILD difficulty breathing (e.g., minimal/no SOB at rest, SOB with walking, pulse < 100) AND [2] NEW-onset or WORSE than normal  Answer Assessment - Initial Assessment Questions Patient states he has a sensitive sinus condition. He states last night he  had difficulty sleeping up every hour. He states symptoms were better this morning. He states symptoms are random.  Pt referred to UC and offered to have an appointment made for him to UC, but declined. Pt states he may go to UC tomorrow after work. States he will relax at home and will decide if he will go to be seen.       1. RESPIRATORY STATUS: Describe your breathing? (e.g., wheezing, shortness of breath, unable to speak, severe coughing)      Wheezing when exhaling.  2. ONSET: When did this breathing problem begin?      Saturday  3. PATTERN Does the difficult breathing come and go, or has it been constant since it started?      Comes and goes.  4. SEVERITY: How bad is your breathing? (e.g., mild, moderate, severe)      Mild to moderate  5. RECURRENT SYMPTOM: Have you had difficulty breathing before? If Yes, ask: When was the last time? and What happened that time?      No this is new for him.  6. CARDIAC HISTORY: Do you  have any history of heart disease? (e.g., heart attack, angina, bypass surgery, angioplasty)      Yes, has a hx of heart issues.  7. LUNG HISTORY: Do you have any history of lung disease?  (e.g., pulmonary embolus, asthma, emphysema)     No but states he smokes.  8. OTHER SYMPTOMS: Do you have any other symptoms? (e.g., chest pain, cough, dizziness, fever, runny nose)     Cough, sneezing, runny nose, congestion for a week.  9. O2 SATURATION MONITOR:  Do you use an oxygen saturation monitor (pulse oximeter) at home? If Yes, ask: What is your reading (oxygen level) today? What is your usual oxygen saturation reading? (e.g., 95%)       No he does not have one at home.  Protocols used: Breathing Difficulty-A-AH

## 2024-04-10 LAB — TROPONIN T, HIGH SENSITIVITY: Troponin T High Sensitivity: <15 ng/L (ref 0–19)

## 2024-04-12 NOTE — Progress Notes (Unsigned)
 CARDIOLOGY OFFICE NOTE  Date:  04/15/2024    Jason Maldonado Date of Birth: 05-May-1956 Medical Record #[id number]  PCP:  de [place], Raymond J, MD  Cardiologist:  [place]    Chief Complaint  Patient presents with   Congestive Heart Failure   Coronary Artery Disease    History of Present Illness: Jason Maldonado is a 68 y.o. male who is seen for evaluation of tachycardia. He has a history of CHF and CAD.  He has a past medical history of hyperlipidemia, bipolar disorder, tobacco abuse, and a history of CAD status post previous inferior MI in 2014 with emergent PCI to the RCA.   He had second STEMI on 03/10/2015 as a inferior STEMI in the setting of RCA in-stent restenosis/occlusion. Cardiac catheterization on 03/10/2015 showed 100% stenosis of prox and mid RCA prox to a previously placed unknown stent, 4.038 mm Resolute DES stent postdilated to 4.41 mm proximally and 4.1 mm distally. 50% residual RPDA, 40% 1st RPLB, 40%distal RCA lesion. Post cath, he was placed on aspirin  and Brilinta . Echo EF 30-35%, basal to mid inferior akinesis, inferoseptal and inferolateral severe hypokinesis. He did have refractory hypotension that gradually improved. Repeat Echo in October 2016 showed inferior HK with EF 50-55%.   He contacted our office in February 2024 complaining of a sinking feeling, dizziness, lightheadedness associated with palpitations. A 14-day monitor was placed that showed episodes of wide-complex tachycardia, some quite long > 5 hours. Was seen by Dr Nancey with EP. Echo done showing significant drop in EF to 20-25% with inferior and inferolateral AK.  He underwent outpatient R/LHC on 01/19/2023 revealed nonobstructive CAD, continue patency of stents in the RCA, normal LV filling pressures, normal right heart pressures, reduced cardiac output.  Optimization of heart failure therapy was recommended it was felt that his LV failure was likely due to negative remodeling from prior infarcts.   He was started on Entresto  and carvedilol .  Jardiance  was also added to his medication regimen.  He was advised to follow-up with EP for consideration of ICD. He was seen by Dr Nancey in July - recommended repeat assessment of EF 90 days post optimization of meds. This showed persistently low EF 30-35%  He was seen by Pharm D for lipid management. Pitivastatin stopped due persistent myalgias. Did not improve. Nexlizet  also stopped. Discussed Repatha therapy. But he deferred cholesterol therapy.   He was admitted 5/30-01/16/24 with weakness, palpitations and found to be in VT.  He was started on amiodarone  / lidocaine . VT did not recur and he was transitioned to mexiletine.  ECHO showed LVEF 30-35%, LHC with stable non-obs CAD, patent RCA stent. cMRI demonstrated scar with minimal viability. He was transitioned to PO amiodarone . ICD was ultimately placed. He was discharged on mexiletine 150 mg + brilinta . No formal driving restrictions given no syncope. He was seen in EP clinic. Complained of tremors that he thought were related to mexilitene. Dose was reduced. Device check in July showed no recurrent VT.   He was seen in the ED on August 27 with COPD exacerbation. Given decadron  and albuterol  which helped a lot but he still has some wheezing and congestion.        Past Medical History:  Diagnosis Date   Bipolar 1 disorder (HCC)    CAD (coronary artery disease)    a. STEMI 09/2012  single vessel occlusive CAD of the RCA s/p DES to mid RCA, EF 45%;  b. inferior STEMI 03/10/2015 DES  to prox and mid RCA   Depression    Hyperlipidemia    LV dysfunction    a. 09/2012 Cath LV gram severe basal to midinferior wall hypokinesis, EF 45% and echo EF 55%.   PAF (paroxysmal atrial fibrillation) (HCC)    a. noted on admission 02/2015 in setting of inf stemi;  b. CHA2DS2VASc = 1.   Tobacco abuse     Past Surgical History:  Procedure Laterality Date   APPENDECTOMY     CARDIAC CATHETERIZATION N/A 03/10/2015    Procedure: Left Heart Cath and Coronary Angiography;  Surgeon: Debby DELENA Sor, MD;  Location: [facility] INVASIVE CV LAB;  Service: Cardiovascular;  Laterality: N/A;   CARDIAC CATHETERIZATION  03/10/2015   Procedure: Coronary Stent Intervention;  Surgeon: Debby DELENA Sor, MD;  Location: [facility] INVASIVE CV LAB;  Service: Cardiovascular;;   ICD IMPLANT N/A 01/15/2024   Procedure: ICD IMPLANT;  Surgeon: Nancey Eulas BRAVO, MD;  Location: [facility] INVASIVE CV LAB;  Service: Cardiovascular;  Laterality: N/A;   INGUINAL HERNIA REPAIR Left 05/25/2021   Procedure: OPEN LEFT INGUINAL HERNIA AND EPIGASTRIC HERNIA REPAIR;  Surgeon: Signe Mitzie DELENA, MD;  Location: [facility] ORS;  Service: General;  Laterality: Left;   LEFT HEART CATH AND CORONARY ANGIOGRAPHY N/A 01/12/2024   Procedure: LEFT HEART CATH AND CORONARY ANGIOGRAPHY;  Surgeon: Elmira Jason PARAS, MD;  Location: [facility] INVASIVE CV LAB;  Service: Cardiovascular;  Laterality: N/A;   LEFT HEART CATHETERIZATION WITH CORONARY ANGIOGRAM N/A 09/27/2012   Procedure: LEFT HEART CATHETERIZATION WITH CORONARY ANGIOGRAM;  Surgeon: Ashyra Cantin M [place], MD;  Location: [facility] CATH LAB;  Service: Cardiovascular;  Laterality: N/A;   RIGHT/LEFT HEART CATH AND CORONARY ANGIOGRAPHY N/A 01/19/2023   Procedure: RIGHT/LEFT HEART CATH AND CORONARY ANGIOGRAPHY;  Surgeon: [place], Georgeanne Frankland M, MD;  Location: [facility] INVASIVE CV LAB;  Service: Cardiovascular;  Laterality: N/A;   TONSILLECTOMY       Medications: Current Outpatient Medications  Medication Sig Dispense Refill   amiodarone  (PACERONE ) 200 MG tablet START by taking 400mg  (2 tablets) by mouth twice daily for 1 week, then take 1 tablet (200 mg total) by mouth daily. 49 tablet 0   amiodarone  (PACERONE ) 200 MG tablet Take 1 tablet (200 mg total) by mouth daily. 30 tablet 5   aspirin  EC 81 MG EC tablet Take 1 tablet (81 mg total) by mouth daily.     carvedilol  (COREG ) 6.25 MG tablet TAKE 1 TABLET BY MOUTH TWICE A DAY 180 tablet 2   clopidogrel  (PLAVIX ) 75 MG tablet  Take 1 tablet (75 mg total) by mouth daily. 90 tablet 3   docusate sodium  (COLACE) 100 MG capsule Take 100 mg by mouth 2 (two) times daily.     empagliflozin  (JARDIANCE ) 10 MG TABS tablet TAKE 1 TABLET BY MOUTH DAILY BEFORE BREAKFAST. 90 tablet 1   loratadine (CLARITIN) 10 MG tablet Take 10 mg by mouth daily as needed for allergies. (Patient taking differently: Take 10 mg by mouth daily.)     mexiletine (MEXITIL ) 200 MG capsule Take 1 capsule (200 mg total) by mouth 2 (two) times daily. 180 capsule 3   mometasone -formoterol  (DULERA) 100-5 MCG/ACT AERO Inhale 2 puffs into the lungs daily at 2 PM. 1 each 6   nitroGLYCERIN  (NITROSTAT ) 0.4 MG SL tablet Take as needed for chest pain. 1 tablet under tongue every 5 mins up to 3 tablets.After 3 tablets if chest pain call 911. 25 tablet 11   sacubitril -valsartan  (ENTRESTO ) 24-26 MG TAKE 1 TABLET BY MOUTH TWICE A DAY  180 tablet 2   acetaminophen  (TYLENOL ) 500 MG tablet Take 1,000 mg by mouth every 6 (six) hours as needed (take 1-2 tablets as needed for pain). (Patient not taking: Reported on 04/15/2024)     No current facility-administered medications for this visit.    Allergies: Allergies  Allergen Reactions   Nsaids Other (See Comments)    Interaction with Brilinta ; causes internal bleeding    Social History: The patient  reports that he has been smoking cigarettes. He started smoking about 49 years ago. He has a 20 pack-year smoking history. He has been exposed to tobacco smoke. He has never used smokeless tobacco. He reports that he does not currently use alcohol. He reports that he does not use drugs.   Family History: The patient's family history includes CAD in his father.   Review of Systems: Please see the history of present illness.    All other systems are reviewed and negative.   Physical Exam: VS:  BP (!) 159/91 (BP Location: Left Arm, Patient Position: Sitting, Cuff Size: Normal)   Pulse 77   Ht 5' 7 (1.702 m)   Wt 136 lb 6.4 oz  (61.9 kg)   SpO2 95%   BMI 21.36 kg/m  .  BMI Body mass index is 21.36 kg/m.  Wt Readings from Last 3 Encounters:  04/15/24 136 lb 6.4 oz (61.9 kg)  04/09/24 150 lb (68 kg)  02/14/24 139 lb 3.2 oz (63.1 kg)   GENERAL:  NAD HEENT:  PERRL, EOMI, sclera are clear. Oropharynx is clear. NECK:  No jugular venous distention, carotid upstroke brisk and symmetric, no bruits, no thyromegaly or adenopathy LUNGS:  scattered wheezing CHEST:  Unremarkable HEART:  RRR, tachy,  PMI not displaced or sustained,S1 and S2 within normal limits, no S3, no S4: no clicks, no rubs, no murmurs ABD:  Soft, nontender. BS +, no masses or bruits. No hepatomegaly, no splenomegaly EXT:  2 + pulses throughout, no edema, no cyanosis no clubbing SKIN:  Warm and dry.  No rashes NEURO:  Alert and oriented x 3. Cranial nerves II through XII intact. PSYCH:  Cognitively intact     LABORATORY DATA:        Lab Results  Component Value Date   WBC 7.4 04/09/2024   HGB 14.3 04/09/2024   HCT 44.2 04/09/2024   PLT 201 04/09/2024   GLUCOSE 89 04/09/2024   CHOL 182 05/08/2023   TRIG 105 05/08/2023   HDL 49 05/08/2023   LDLCALC 114 (H) 05/08/2023   ALT 13 02/14/2024   AST 15 02/14/2024   NA 131 (L) 04/09/2024   K 4.1 04/09/2024   CL 94 (L) 04/09/2024   CREATININE 1.14 04/09/2024   BUN 13 04/09/2024   CO2 23 04/09/2024   TSH 3.270 02/14/2024   PSA 1.30 08/03/2015   INR 1.0 01/17/2023   HGBA1C 5.7 (H) 09/27/2012    BNP (last 3 results) Recent Labs    01/12/24 0329  BNP 600.1*    ProBNP (last 3 results) Recent Labs    04/09/24 2110  PROBNP 2,796.0*     Other Studies Reviewed Today: Echo: 06/08/15:Study Conclusions  - Procedure narrative: Limited transthoracic echocardiography for   left ventricular function evaluation. Image quality was adequate. - Left ventricle: The cavity size was normal. Wall thickness was   normal. Systolic function was normal. The estimated ejection   fraction was in  the range of 50% to 55%. There is inferior and   inferoseptal hypokinesis.  Impressions:  -  Limited echo for LV function. There is inferior and inferoseptal   hypokinesis. LVEF has improved to 50-55%.  Event monitor 10/30/22: Study Highlights      Normal sinus rhythm   Frequent and sustained runs of wide complex tachycardia most c/w ventricular tachycardia. longest lasting 5 hours 47 minutes. Patient symptoms correlate with this.   Rare brief runs of SVT.     Patch Wear Time:  13 days and 23 hours (2024-02-17T16:17:09-498 to 2024-03-02T16:16:58-0500)   Patient had a min HR of 40 bpm, max HR of 210 bpm, and avg HR of 87 bpm. Predominant underlying rhythm was Sinus Rhythm. First Degree AV Block was present. 274 Ventricular Tachycardia runs occurred, the run with the fastest interval lasting 5 hours 47  mins with a max rate of 210 bpm (avg 161 bpm); the run with the fastest interval was also the longest. 5 Supraventricular Tachycardia runs occurred, the run with the fastest interval lasting 11 beats with a max rate of 156 bpm (avg 138 bpm); the run with  the fastest interval was also the longest. 1 episode(s) of AV Block (2nd) occurred, lasting a total of 2 secs. Ventricular Tachycardia was detected within +/- 45 seconds of symptomatic patient event(s). Isolated SVEs were frequent (8.3%, [id number]), SVE  Couplets were rare (<1.0%, 508), and SVE Triplets were rare (<1.0%, 546). Isolated VEs were occasional (1.8%, [id number]), VE Couplets were rare (<1.0%, 16), and VE Triplets were rare (<1.0%, 4). Ventricular Bigeminy and Trigeminy were present. MD  notification criteria for Ventricular Tachycardia met - notified Norlene RAMAN on 30 Oct 2022 at 10:03 am CT (MD).    Echo 01/05/23: IMPRESSIONS     1. Global hypokinesis with akinesis of the inferior and inferolateral  walls; overall severe LV dysfunction.   2. Left ventricular ejection fraction, by estimation, is 25 to 30%. The  left ventricle has  severely decreased function. The left ventricle  demonstrates regional wall motion abnormalities (see scoring  diagram/findings for description). The left  ventricular internal cavity size was mildly dilated. There is moderate  left ventricular hypertrophy. Left ventricular diastolic parameters are  consistent with Grade I diastolic dysfunction (impaired relaxation).   3. Right ventricular systolic function is normal. The right ventricular  size is normal.   4. Left atrial size was mildly dilated.   5. The mitral valve is normal in structure. No evidence of mitral valve  regurgitation. No evidence of mitral stenosis.   6. The aortic valve is tricuspid. Aortic valve regurgitation is not  visualized. Aortic valve sclerosis is present, with no evidence of aortic  valve stenosis.   7. The inferior vena cava is normal in size with greater than 50%  respiratory variability, suggesting right atrial pressure of 3 mmHg.   Comparison(s): No prior Echocardiogram.   Cardiac cath 01/19/23:  RIGHT/LEFT HEART CATH AND CORONARY ANGIOGRAPHY   Conclusion      Mid RCA to Dist RCA lesion is 60% stenosed.   Mid RCA lesion is 40% stenosed.   Prox LAD to Mid LAD lesion is 20% stenosed.   1st Diag lesion is 30% stenosed.   LV end diastolic pressure is normal.   Nonobstructive CAD. Continued patency of stents in the RCA Normal LV filling pressures.  Normal right heart pressures Cardiac output is reduced. CO 3.3 L/min with index 1.84   Plan: continue to optimize CHF therapy. Suspect LV failure due to negative remodeling from prior infarcts. Now on Entresto  and Coreg . Will add Jardiance . Suspect wide complex tachycardia  is scar mediated from prior infarcts.   Echo 04/26/23: IMPRESSIONS     1. Left ventricular ejection fraction, by estimation, is 30 to 35%. The  left ventricle has moderately decreased function. The left ventricle  demonstrates global hypokinesis. Left ventricular diastolic parameters are   indeterminate.   2. Right ventricular systolic function is normal. The right ventricular  size is normal. Tricuspid regurgitation signal is inadequate for assessing  PA pressure.   3. Left atrial size was mildly dilated.   4. The mitral valve is grossly normal. Trivial mitral valve  regurgitation. No evidence of mitral stenosis.   5. The aortic valve is tricuspid. There is mild calcification of the  aortic valve. Aortic valve regurgitation is not visualized. No aortic  stenosis is present.   6. The inferior vena cava is normal in size with greater than 50%  respiratory variability, suggesting right atrial pressure of 3 mmHg.    Cardiac cath 01/12/24:  LEFT HEART CATH AND CORONARY ANGIOGRAPHY   Conclusion  Coronary angiography 01/12/2024: LM: Normal LAD: Mild 20-30% disease in prox mid LAD, diag 1 Lcx: No significant disease RCA: Large dominant vessel           Patent prox-mid stents with minimal 30% late lumen loss           Mid 50% disease past the stents           Minimal disease distal RCA   LVEDP 7 mmHg   No acute ischemic etiology for VT, likely scar mediated VT Normal filling pressures Consider ICD placement, however patient seems to be very reluctant   Jason JINNY Lawrence, MD   Echo 01/12/04: IMPRESSIONS     1. Left ventricular ejection fraction, by estimation, is 30 to 35%. The  left ventricle has moderately decreased function. The left ventricle  demonstrates regional wall motion abnormalities (see scoring  diagram/findings for description). Left ventricular   diastolic parameters are consistent with Grade I diastolic dysfunction  (impaired relaxation). The entire septum, entire inferior wall, posterior  wall, and apex are hypokinetic.   2. Right ventricular systolic function is mildly reduced. The right  ventricular size is normal. Tricuspid regurgitation signal is inadequate  for assessing PA pressure.   3. The mitral valve is grossly normal. No evidence of  mitral valve  regurgitation. No evidence of mitral stenosis.   4. The aortic valve is tricuspid. Aortic valve regurgitation is not  visualized. No aortic stenosis is present.   Comparison(s): A prior study was performed on 04/26/2023. Reported LVEF  30-35% indeterminate diastolic function, mild left atrial dilatation.   Conclusion(s)/Recommendation(s): Consider either limited echo with  definity or cMRI if no contraindication to evaluate EF and RWMA.   Cardiac MR 01/14/24: IMPRESSION: 1. Normal left ventricular size with inferior and inferolateral thinning and akinesis, mid anterolateral thinning/akinesis, inferoseptal hypokinesis. LV EF calculated at 20%. No thrombus noted.   2. Normal RV size with calculated RV EF 24%. Visually RV function looked better than this.   3. Delayed enhancement images suggest a large myocardial infarction involving the inferior, inferolateral, and mid anterolateral walls with minimal viability.   4. Mildly elevated extracellular volume percentage at 33% likely represents scarring from prior MI.   Jason Maldonado  Assessment/Plan:  1.  Ventricular tachycardia/scar mediated. Now on amiodarone  and mexilitene. Seeing EP tomorrow. S/p ICD implant.  -   2. CAD s/p inferior MI with stenting in 2014. Recurrent STEMI in July 2016 with instent restenosis/occlusion. S/p repeat DES. On DAPT.  Would favor continuing this long term. Will switch Brilinta  to Plavix . Due to cost and SOB. continue ASA 81 mg daily. Continue low dose beta blocker. Cardiac cath last June showed nonobstructive CAD. Normal filling pressures.   3. Chronic combined  systolic/diastolic  CHF. EF 20-25%. Now on Entresto  24/26 mg bid, Coreg  and Jardiance . EF improved to 30-35%. Has had hypotension in the past so will continue current dose.    4. Hyperlipidemia. Intolerant of lipitor , Livalo , and Nexlizet . LDL 114 off meds. Does not wish to pursue other therapy (Repatha) at this time.   5.. Bipolar  disorder.   6. Tobacco abuse. Encourage efforts at complete smoking cessation.   7. COPD exacerbation. Continue albuterol  PRN. Add Dulera 100/50 2 puffs daily. Smoking cessation.   Current medicines are reviewed with the patient today.  The patient does not have concerns regarding medicines other than what has been noted above.  The following changes have been made:  See above.  Labs/ tests ordered today include:    No orders of the defined types were placed in this encounter.    Disposition:   FU  with me in 4 months.   Signed: Elisha Mcgruder [place] MD, FACC    04/15/2024 4:54 PM

## 2024-04-15 ENCOUNTER — Encounter: Payer: Self-pay | Admitting: Cardiology

## 2024-04-15 ENCOUNTER — Ambulatory Visit: Admitting: Cardiology

## 2024-04-15 ENCOUNTER — Ambulatory Visit: Attending: Cardiology | Admitting: Cardiology

## 2024-04-15 VITALS — BP 159/91 | HR 77 | Ht 67.0 in | Wt 136.4 lb

## 2024-04-15 DIAGNOSIS — I255 Ischemic cardiomyopathy: Secondary | ICD-10-CM | POA: Diagnosis not present

## 2024-04-15 DIAGNOSIS — I5042 Chronic combined systolic (congestive) and diastolic (congestive) heart failure: Secondary | ICD-10-CM

## 2024-04-15 DIAGNOSIS — J441 Chronic obstructive pulmonary disease with (acute) exacerbation: Secondary | ICD-10-CM | POA: Diagnosis not present

## 2024-04-15 DIAGNOSIS — I472 Ventricular tachycardia, unspecified: Secondary | ICD-10-CM | POA: Diagnosis not present

## 2024-04-15 MED ORDER — MOMETASONE FURO-FORMOTEROL FUM 100-5 MCG/ACT IN AERO: 2.0000 | INHALATION_SPRAY | Freq: Every day | RESPIRATORY_TRACT | 6 refills | Status: AC

## 2024-04-15 MED ORDER — CLOPIDOGREL BISULFATE 75 MG PO TABS: 75.0000 mg | ORAL_TABLET | Freq: Every day | ORAL | 3 refills | Status: AC

## 2024-04-15 NOTE — Patient Instructions (Addendum)
 Medication Instructions:  Stop Brilinta  Start Plavix  75 mg daily Start Dulera inhaler  2 puffs every day  Continue all other medications *If you need a refill on your cardiac medications before your next appointment, please call your pharmacy*  Lab Work: None ordered  Testing/Procedures: None ordered  Follow-Up: At [facility], you and your health needs are our priority.  As part of our continuing mission to provide you with exceptional heart care, our providers are all part of one team.  This team includes your primary Cardiologist (physician) and Advanced Practice Providers or APPs (Physician Assistants and Nurse Practitioners) who all work together to provide you with the care you need, when you need it.  Your next appointment:  4 months   Call in Oct to schedule Jan appointment     Provider:  Dr.Jordan    We recommend signing up for the patient portal called MyChart.  Sign up information is provided on this After Visit Summary.  MyChart is used to connect with patients for Virtual Visits (Telemedicine).  Patients are able to view lab/test results, encounter notes, upcoming appointments, etc.  Non-urgent messages can be sent to your provider as well.   To learn more about what you can do with MyChart, go to [email or web address].

## 2024-04-16 ENCOUNTER — Encounter: Payer: Self-pay | Admitting: Cardiovascular Disease

## 2024-04-16 ENCOUNTER — Ambulatory Visit (INDEPENDENT_AMBULATORY_CARE_PROVIDER_SITE_OTHER): Admitting: Cardiovascular Disease

## 2024-04-16 VITALS — BP 146/80 | HR 75 | Ht 67.0 in | Wt 134.0 lb

## 2024-04-16 DIAGNOSIS — I255 Ischemic cardiomyopathy: Secondary | ICD-10-CM

## 2024-04-16 DIAGNOSIS — Z95810 Presence of automatic (implantable) cardiac defibrillator: Secondary | ICD-10-CM | POA: Diagnosis not present

## 2024-04-16 DIAGNOSIS — I5042 Chronic combined systolic (congestive) and diastolic (congestive) heart failure: Secondary | ICD-10-CM | POA: Diagnosis not present

## 2024-04-16 DIAGNOSIS — R002 Palpitations: Secondary | ICD-10-CM

## 2024-04-16 DIAGNOSIS — R000 Tachycardia, unspecified: Secondary | ICD-10-CM

## 2024-04-16 DIAGNOSIS — I480 Paroxysmal atrial fibrillation: Secondary | ICD-10-CM | POA: Diagnosis not present

## 2024-04-16 DIAGNOSIS — I472 Ventricular tachycardia, unspecified: Secondary | ICD-10-CM

## 2024-04-16 LAB — CUP PACEART INCLINIC DEVICE CHECK
Date Time Interrogation Session: 20250903140906
HighPow Impedance: 79 Ohm
Implantable Lead Connection Status: 753985
Implantable Lead Implant Date: 20250603
Implantable Lead Location: 753860
Implantable Lead Model: 672
Implantable Lead Serial Number: 275425
Implantable Pulse Generator Implant Date: 20250603
Lead Channel Impedance Value: 417 Ohm
Lead Channel Pacing Threshold Amplitude: 0.8 V
Lead Channel Pacing Threshold Pulse Width: 0.4 ms
Lead Channel Sensing Intrinsic Amplitude: 23.6 mV
Lead Channel Setting Pacing Amplitude: 2 V
Lead Channel Setting Pacing Pulse Width: 0.4 ms
Lead Channel Setting Sensing Sensitivity: 0.5 mV
Pulse Gen Serial Number: 350929
Zone Setting Status: 755011

## 2024-04-16 NOTE — Patient Instructions (Signed)
 Medication Instructions:  Your physician recommends that you continue on your current medications as directed. Please refer to the Current Medication list given to you today.  *If you need a refill on your cardiac medications before your next appointment, please call your pharmacy*  Lab Work: None ordered.  If you have labs (blood work) drawn today and your tests are completely normal, you will receive your results only by: MyChart Message (if you have MyChart) OR A paper copy in the mail If you have any lab test that is abnormal or we need to change your treatment, we will call you to review the results.  Testing/Procedures: None ordered.   Follow-Up: At [facility], you and your health needs are our priority.  As part of our continuing mission to provide you with exceptional heart care, our providers are all part of one team.  This team includes your primary Cardiologist (physician) and Advanced Practice Providers or APPs (Physician Assistants and Nurse Practitioners) who all work together to provide you with the care you need, when you need it.  Your next appointment:   6 months with Dr Marko APP

## 2024-04-16 NOTE — Progress Notes (Signed)
  Electrophysiology Office Note:    Date:  04/16/2024   ID:  Jason Maldonado, DOB [birth year], MRN [id number]  PCP:  de [place], Raymond J, MD   [facility] Providers Cardiologist:  Peter [place], MD Electrophysiologist:  Eulas FORBES Furbish, MD     Referring MD: de [place], Quintin PARAS, MD   History of Present Illness:    Jason Maldonado is a 68 y.o. male with a hx listed below, significant for CAD s/p inferior MI, PAF, hyperlipidemia referred for arrhythmia management.  He contacted our office in February complaining of a sinking feeling, dizziness, lightheadedness associated with palpitations that began in January.  A 14-day monitor was placed that showed episodes of wide-complex tachycardia, some quite long.  At our last visit, EP study was discussed to assess wide complex tachcyardia associated with pre-sycope. Follow-up TTE showed recurrence of CHFrEF, EF25-30%. He was referred back to his cardiologist, Dr. [place] for management. Cath showed non-obstructive CAD. GDMT was started. EP study was cancelled with recurrence of cardiomyopathy.  He has not had syncope, pre-syncope, chest pain.   EKGs/Labs/Other Studies Reviewed Today:    Echocardiogram:  TTE 06/07/2025 EF 50-55%, inferior and inferoseptal hypokinesis  TTE 01/06/2023 EF 25-30%, Gr I diastolic dysfunction; mildly dilated LA  Monitors:  Zio Patch XT - 14 days 09/2022 My interpretation Sins rhythm HR 47-111, avg 76 Multiple runs of tachycardia with QRS wider than that of sinus rhythm PVC burden is 8%  Stress testing:   Advanced imaging:  Coronary angiogram: 01/19/2023 Reviewed in Epic - non-obstructive CAD   EKG:  Last EKG results: today -- sinus rhythm with frequent pleiomorphic PVCs   Recent Labs: 01/12/2024: B Natriuretic Peptide 600.1; Magnesium 2.0 02/14/2024: ALT 13; TSH 3.270 04/09/2024: BUN 13; Creatinine, Ser 1.14; Hemoglobin 14.3; Platelets 201; Potassium 4.1; Pro Brain Natriuretic Peptide 2,796.0;  Sodium 131     Physical Exam:    VS:  There were no vitals taken for this visit.    Wt Readings from Last 3 Encounters:  04/15/24 136 lb 6.4 oz (61.9 kg)  04/09/24 150 lb (68 kg)  02/14/24 139 lb 3.2 oz (63.1 kg)     GEN:  Well nourished, well developed in no acute distress CARDIAC: RRR, no murmurs, rubs, gallops RESPIRATORY:  Normal work of breathing MUSCULOSKELETAL: no edema    ASSESSMENT & PLAN:    VT Admission June, 2025 for hemodynamically stable VT continue amiodarone  200, mexilitine 200 BID, carvedilol  6.25mg  Initially very resistant to have ICD,   Recurrent ischemic cardiomyopathy At time of STEMI; EF now < 30%, still with inferior WMA Continue jardiance  10, entresto  24-26, coreg  6.25  Frequent Pleiomorphic PVCs Continue carvedilol . He is not particularly symptomatic  Paroxysmal atrial fibrillation Per chart history. I was not able to locate ECGs  CAD  S/p inferior STEMI 03/10/2015  Hypertension continue carvedilol , entresto  I advised getting a home monitor He is having a lot of joint pain presently likely contributing          Medication Adjustments/Labs and Tests Ordered: Current medicines are reviewed at length with the patient today.  Concerns regarding medicines are outlined above.  Orders Placed This Encounter  Procedures   EKG 12-Lead   No orders of the defined types were placed in this encounter.    Signed, Eulas FORBES Furbish, MD  04/16/2024 2:00 PM    [facility]

## 2024-05-20 NOTE — Progress Notes (Signed)
 Remote ICD Transmission

## 2024-05-29 ENCOUNTER — Ambulatory Visit (INDEPENDENT_AMBULATORY_CARE_PROVIDER_SITE_OTHER)

## 2024-05-29 DIAGNOSIS — I5042 Chronic combined systolic (congestive) and diastolic (congestive) heart failure: Secondary | ICD-10-CM

## 2024-05-30 LAB — CUP PACEART REMOTE DEVICE CHECK
Battery Remaining Longevity: 180 mo
Battery Remaining Percentage: 100 %
Brady Statistic RV Percent Paced: 0 %
Date Time Interrogation Session: 20251016010100
HighPow Impedance: 77 Ohm
Implantable Lead Connection Status: 753985
Implantable Lead Implant Date: 20250603
Implantable Lead Location: 753860
Implantable Lead Model: 672
Implantable Lead Serial Number: 275425
Implantable Pulse Generator Implant Date: 20250603
Lead Channel Impedance Value: 415 Ohm
Lead Channel Pacing Threshold Amplitude: 0.7 V
Lead Channel Pacing Threshold Pulse Width: 0.4 ms
Lead Channel Setting Pacing Amplitude: 2 V
Lead Channel Setting Pacing Pulse Width: 0.4 ms
Lead Channel Setting Sensing Sensitivity: 0.5 mV
Pulse Gen Serial Number: 350929
Zone Setting Status: 755011

## 2024-06-04 NOTE — Progress Notes (Signed)
 Remote ICD Transmission

## 2024-06-09 ENCOUNTER — Ambulatory Visit: Payer: Self-pay | Admitting: Cardiovascular Disease

## 2024-06-16 ENCOUNTER — Encounter: Payer: Self-pay | Admitting: Radiology

## 2024-07-11 ENCOUNTER — Other Ambulatory Visit: Payer: Self-pay | Admitting: Cardiology

## 2024-08-02 ENCOUNTER — Other Ambulatory Visit: Payer: Self-pay | Admitting: Physician Assistant

## 2024-08-28 ENCOUNTER — Ambulatory Visit (INDEPENDENT_AMBULATORY_CARE_PROVIDER_SITE_OTHER)

## 2024-08-28 DIAGNOSIS — I5042 Chronic combined systolic (congestive) and diastolic (congestive) heart failure: Secondary | ICD-10-CM

## 2024-08-28 LAB — CUP PACEART REMOTE DEVICE CHECK
Battery Remaining Longevity: 174 mo
Battery Remaining Percentage: 100 %
Brady Statistic RV Percent Paced: 0 %
Date Time Interrogation Session: 20260115010100
HighPow Impedance: 79 Ohm
Implantable Lead Connection Status: 753985
Implantable Lead Implant Date: 20250603
Implantable Lead Location: 753860
Implantable Lead Model: 672
Implantable Lead Serial Number: 275425
Implantable Pulse Generator Implant Date: 20250603
Lead Channel Impedance Value: 420 Ohm
Lead Channel Pacing Threshold Amplitude: 0.8 V
Lead Channel Pacing Threshold Pulse Width: 0.4 ms
Lead Channel Setting Pacing Amplitude: 2 V
Lead Channel Setting Pacing Pulse Width: 0.4 ms
Lead Channel Setting Sensing Sensitivity: 0.5 mV
Pulse Gen Serial Number: 350929
Zone Setting Status: 755011

## 2024-08-29 ENCOUNTER — Ambulatory Visit: Payer: Self-pay | Admitting: Cardiovascular Disease

## 2024-09-04 NOTE — Progress Notes (Signed)
 Remote ICD Transmission

## 2024-10-03 ENCOUNTER — Ambulatory Visit: Admitting: Cardiology

## 2024-11-03 ENCOUNTER — Ambulatory Visit: Admitting: Physician Assistant

## 2024-11-27 ENCOUNTER — Encounter

## 2025-02-26 ENCOUNTER — Encounter

## 2025-05-28 ENCOUNTER — Encounter

## 2025-08-27 ENCOUNTER — Encounter

## 2025-11-26 ENCOUNTER — Encounter
